# Patient Record
Sex: Female | Born: 1945
Health system: Southern US, Community
[De-identification: ages and names within clinical notes are randomized; demographics above are authoritative.]

## PROBLEM LIST (undated history)

## (undated) DIAGNOSIS — J449 Chronic obstructive pulmonary disease, unspecified: Secondary | ICD-10-CM

## (undated) DIAGNOSIS — I10 Essential (primary) hypertension: Secondary | ICD-10-CM

## (undated) DIAGNOSIS — F419 Anxiety disorder, unspecified: Secondary | ICD-10-CM

## (undated) DIAGNOSIS — M1990 Unspecified osteoarthritis, unspecified site: Secondary | ICD-10-CM

## (undated) DIAGNOSIS — E119 Type 2 diabetes mellitus without complications: Secondary | ICD-10-CM

## (undated) DIAGNOSIS — R600 Localized edema: Secondary | ICD-10-CM

## (undated) DIAGNOSIS — C44310 Basal cell carcinoma of skin of unspecified parts of face: Secondary | ICD-10-CM

## (undated) DIAGNOSIS — J40 Bronchitis, not specified as acute or chronic: Secondary | ICD-10-CM

## (undated) DIAGNOSIS — D689 Coagulation defect, unspecified: Secondary | ICD-10-CM

## (undated) DIAGNOSIS — T7840XA Allergy, unspecified, initial encounter: Secondary | ICD-10-CM

## (undated) DIAGNOSIS — D649 Anemia, unspecified: Secondary | ICD-10-CM

## (undated) DIAGNOSIS — E7800 Pure hypercholesterolemia, unspecified: Secondary | ICD-10-CM

## (undated) DIAGNOSIS — H269 Unspecified cataract: Secondary | ICD-10-CM

## (undated) HISTORY — PX: EYE SURGERY: SHX253

## (undated) HISTORY — PX: ABDOMINAL HYSTERECTOMY: SHX81

## (undated) HISTORY — DX: Coagulation defect, unspecified: D68.9

## (undated) HISTORY — DX: Basal cell carcinoma of skin of unspecified parts of face: C44.310

## (undated) HISTORY — DX: Chronic obstructive pulmonary disease, unspecified: J44.9

## (undated) HISTORY — PX: TONSILLECTOMY: SUR1361

## (undated) HISTORY — PX: TUBAL LIGATION: SHX77

## (undated) HISTORY — PX: BASAL CELL CARCINOMA EXCISION: SHX1214

## (undated) HISTORY — DX: Allergy, unspecified, initial encounter: T78.40XA

## (undated) HISTORY — DX: Anemia, unspecified: D64.9

## (undated) HISTORY — DX: Unspecified cataract: H26.9

## (undated) HISTORY — PX: CARPAL TUNNEL RELEASE: SHX101

---

## 2010-01-10 LAB — HM DEXA SCAN: HM Dexa Scan: NORMAL

## 2012-05-14 DIAGNOSIS — S0010XA Contusion of unspecified eyelid and periocular area, initial encounter: Secondary | ICD-10-CM | POA: Diagnosis not present

## 2012-05-29 DIAGNOSIS — K5730 Diverticulosis of large intestine without perforation or abscess without bleeding: Secondary | ICD-10-CM | POA: Diagnosis not present

## 2012-05-29 DIAGNOSIS — Z1211 Encounter for screening for malignant neoplasm of colon: Secondary | ICD-10-CM | POA: Diagnosis not present

## 2012-06-06 DIAGNOSIS — E1139 Type 2 diabetes mellitus with other diabetic ophthalmic complication: Secondary | ICD-10-CM | POA: Diagnosis not present

## 2012-06-06 DIAGNOSIS — E781 Pure hyperglyceridemia: Secondary | ICD-10-CM | POA: Diagnosis not present

## 2012-06-09 HISTORY — PX: COLONOSCOPY: SHX174

## 2012-06-10 DIAGNOSIS — Z01818 Encounter for other preprocedural examination: Secondary | ICD-10-CM | POA: Diagnosis not present

## 2012-06-10 DIAGNOSIS — Z1211 Encounter for screening for malignant neoplasm of colon: Secondary | ICD-10-CM | POA: Diagnosis not present

## 2012-06-10 DIAGNOSIS — K5730 Diverticulosis of large intestine without perforation or abscess without bleeding: Secondary | ICD-10-CM | POA: Diagnosis not present

## 2012-06-10 DIAGNOSIS — K648 Other hemorrhoids: Secondary | ICD-10-CM | POA: Diagnosis not present

## 2012-06-13 DIAGNOSIS — I10 Essential (primary) hypertension: Secondary | ICD-10-CM | POA: Diagnosis not present

## 2012-06-23 LAB — HM COLONOSCOPY

## 2012-06-24 DIAGNOSIS — Z1231 Encounter for screening mammogram for malignant neoplasm of breast: Secondary | ICD-10-CM | POA: Diagnosis not present

## 2012-06-24 DIAGNOSIS — E785 Hyperlipidemia, unspecified: Secondary | ICD-10-CM | POA: Diagnosis not present

## 2012-06-24 DIAGNOSIS — E781 Pure hyperglyceridemia: Secondary | ICD-10-CM | POA: Diagnosis not present

## 2012-06-24 DIAGNOSIS — I10 Essential (primary) hypertension: Secondary | ICD-10-CM | POA: Diagnosis not present

## 2012-06-24 DIAGNOSIS — E1139 Type 2 diabetes mellitus with other diabetic ophthalmic complication: Secondary | ICD-10-CM | POA: Diagnosis not present

## 2012-06-24 DIAGNOSIS — E1165 Type 2 diabetes mellitus with hyperglycemia: Secondary | ICD-10-CM | POA: Diagnosis not present

## 2012-06-24 DIAGNOSIS — M25569 Pain in unspecified knee: Secondary | ICD-10-CM | POA: Diagnosis not present

## 2012-10-16 DIAGNOSIS — Z23 Encounter for immunization: Secondary | ICD-10-CM | POA: Diagnosis not present

## 2013-01-28 DIAGNOSIS — I10 Essential (primary) hypertension: Secondary | ICD-10-CM | POA: Diagnosis not present

## 2013-01-28 DIAGNOSIS — IMO0002 Reserved for concepts with insufficient information to code with codable children: Secondary | ICD-10-CM | POA: Diagnosis not present

## 2013-01-28 DIAGNOSIS — H2510 Age-related nuclear cataract, unspecified eye: Secondary | ICD-10-CM | POA: Diagnosis not present

## 2013-01-28 DIAGNOSIS — E119 Type 2 diabetes mellitus without complications: Secondary | ICD-10-CM | POA: Diagnosis not present

## 2013-01-28 DIAGNOSIS — E785 Hyperlipidemia, unspecified: Secondary | ICD-10-CM | POA: Diagnosis not present

## 2013-01-28 DIAGNOSIS — M1710 Unilateral primary osteoarthritis, unspecified knee: Secondary | ICD-10-CM | POA: Diagnosis not present

## 2013-04-17 DIAGNOSIS — J209 Acute bronchitis, unspecified: Secondary | ICD-10-CM | POA: Diagnosis not present

## 2013-04-28 DIAGNOSIS — J40 Bronchitis, not specified as acute or chronic: Secondary | ICD-10-CM | POA: Diagnosis not present

## 2013-06-03 DIAGNOSIS — E119 Type 2 diabetes mellitus without complications: Secondary | ICD-10-CM | POA: Diagnosis not present

## 2013-06-03 DIAGNOSIS — I10 Essential (primary) hypertension: Secondary | ICD-10-CM | POA: Diagnosis not present

## 2013-06-18 ENCOUNTER — Ambulatory Visit: Payer: Self-pay | Admitting: Internal Medicine

## 2013-06-18 DIAGNOSIS — R922 Inconclusive mammogram: Secondary | ICD-10-CM | POA: Diagnosis not present

## 2013-06-18 DIAGNOSIS — Z1231 Encounter for screening mammogram for malignant neoplasm of breast: Secondary | ICD-10-CM | POA: Diagnosis not present

## 2013-06-20 DIAGNOSIS — H6120 Impacted cerumen, unspecified ear: Secondary | ICD-10-CM | POA: Diagnosis not present

## 2013-06-20 DIAGNOSIS — H93299 Other abnormal auditory perceptions, unspecified ear: Secondary | ICD-10-CM | POA: Diagnosis not present

## 2013-07-09 ENCOUNTER — Ambulatory Visit: Payer: Self-pay | Admitting: Internal Medicine

## 2013-07-09 DIAGNOSIS — R928 Other abnormal and inconclusive findings on diagnostic imaging of breast: Secondary | ICD-10-CM | POA: Diagnosis not present

## 2013-07-09 DIAGNOSIS — N6459 Other signs and symptoms in breast: Secondary | ICD-10-CM | POA: Diagnosis not present

## 2013-07-09 LAB — HM MAMMOGRAPHY: HM Mammogram: NORMAL

## 2013-08-12 DIAGNOSIS — L909 Atrophic disorder of skin, unspecified: Secondary | ICD-10-CM | POA: Diagnosis not present

## 2013-08-12 DIAGNOSIS — L821 Other seborrheic keratosis: Secondary | ICD-10-CM | POA: Diagnosis not present

## 2013-08-12 DIAGNOSIS — D485 Neoplasm of uncertain behavior of skin: Secondary | ICD-10-CM | POA: Diagnosis not present

## 2013-08-12 DIAGNOSIS — L138 Other specified bullous disorders: Secondary | ICD-10-CM | POA: Diagnosis not present

## 2013-08-12 DIAGNOSIS — D1801 Hemangioma of skin and subcutaneous tissue: Secondary | ICD-10-CM | POA: Diagnosis not present

## 2013-08-12 DIAGNOSIS — Z1283 Encounter for screening for malignant neoplasm of skin: Secondary | ICD-10-CM | POA: Diagnosis not present

## 2013-08-12 DIAGNOSIS — L919 Hypertrophic disorder of the skin, unspecified: Secondary | ICD-10-CM | POA: Diagnosis not present

## 2013-09-29 DIAGNOSIS — I10 Essential (primary) hypertension: Secondary | ICD-10-CM | POA: Diagnosis not present

## 2013-09-29 DIAGNOSIS — E119 Type 2 diabetes mellitus without complications: Secondary | ICD-10-CM | POA: Diagnosis not present

## 2013-09-29 DIAGNOSIS — Z23 Encounter for immunization: Secondary | ICD-10-CM | POA: Diagnosis not present

## 2013-09-29 DIAGNOSIS — Z0000 Encounter for general adult medical examination without abnormal findings: Secondary | ICD-10-CM | POA: Diagnosis not present

## 2013-09-29 DIAGNOSIS — E785 Hyperlipidemia, unspecified: Secondary | ICD-10-CM | POA: Diagnosis not present

## 2013-09-29 LAB — HM DIABETES FOOT EXAM: HM Diabetic Foot Exam: NORMAL

## 2013-09-29 LAB — LIPID PANEL
Cholesterol: 168 mg/dL (ref 0–200)
HDL: 53 mg/dL (ref 35–70)
LDL CALC: 80 mg/dL
Triglycerides: 174 mg/dL — AB (ref 40–160)

## 2013-09-29 LAB — TSH: TSH: 1.3 u[IU]/mL (ref <=5.90)

## 2013-10-15 DIAGNOSIS — D2271 Melanocytic nevi of right lower limb, including hip: Secondary | ICD-10-CM | POA: Diagnosis not present

## 2013-10-15 DIAGNOSIS — D2262 Melanocytic nevi of left upper limb, including shoulder: Secondary | ICD-10-CM | POA: Diagnosis not present

## 2013-10-15 DIAGNOSIS — D485 Neoplasm of uncertain behavior of skin: Secondary | ICD-10-CM | POA: Diagnosis not present

## 2014-01-15 DIAGNOSIS — E782 Mixed hyperlipidemia: Secondary | ICD-10-CM | POA: Diagnosis not present

## 2014-01-15 DIAGNOSIS — E1121 Type 2 diabetes mellitus with diabetic nephropathy: Secondary | ICD-10-CM | POA: Diagnosis not present

## 2014-01-15 DIAGNOSIS — E1165 Type 2 diabetes mellitus with hyperglycemia: Secondary | ICD-10-CM | POA: Diagnosis not present

## 2014-01-15 DIAGNOSIS — I10 Essential (primary) hypertension: Secondary | ICD-10-CM | POA: Diagnosis not present

## 2014-01-15 DIAGNOSIS — M179 Osteoarthritis of knee, unspecified: Secondary | ICD-10-CM | POA: Diagnosis not present

## 2014-02-27 DIAGNOSIS — E119 Type 2 diabetes mellitus without complications: Secondary | ICD-10-CM | POA: Diagnosis not present

## 2014-04-21 DIAGNOSIS — L570 Actinic keratosis: Secondary | ICD-10-CM | POA: Diagnosis not present

## 2014-04-21 DIAGNOSIS — Z872 Personal history of diseases of the skin and subcutaneous tissue: Secondary | ICD-10-CM | POA: Diagnosis not present

## 2014-04-21 DIAGNOSIS — Z1283 Encounter for screening for malignant neoplasm of skin: Secondary | ICD-10-CM | POA: Diagnosis not present

## 2014-04-21 DIAGNOSIS — Z808 Family history of malignant neoplasm of other organs or systems: Secondary | ICD-10-CM | POA: Diagnosis not present

## 2014-05-18 DIAGNOSIS — E1165 Type 2 diabetes mellitus with hyperglycemia: Secondary | ICD-10-CM | POA: Diagnosis not present

## 2014-05-18 DIAGNOSIS — B356 Tinea cruris: Secondary | ICD-10-CM | POA: Diagnosis not present

## 2014-05-18 DIAGNOSIS — I10 Essential (primary) hypertension: Secondary | ICD-10-CM | POA: Diagnosis not present

## 2014-05-18 DIAGNOSIS — E1121 Type 2 diabetes mellitus with diabetic nephropathy: Secondary | ICD-10-CM | POA: Diagnosis not present

## 2014-05-18 LAB — HEMOGLOBIN A1C: HEMOGLOBIN A1C: 7.7 % — AB (ref 4.0–6.0)

## 2014-07-15 ENCOUNTER — Telehealth: Payer: Self-pay

## 2014-07-15 ENCOUNTER — Other Ambulatory Visit: Payer: Self-pay | Admitting: Internal Medicine

## 2014-07-15 ENCOUNTER — Encounter: Payer: Self-pay | Admitting: Internal Medicine

## 2014-07-15 DIAGNOSIS — E1165 Type 2 diabetes mellitus with hyperglycemia: Secondary | ICD-10-CM | POA: Insufficient documentation

## 2014-07-15 DIAGNOSIS — IMO0002 Reserved for concepts with insufficient information to code with codable children: Secondary | ICD-10-CM | POA: Insufficient documentation

## 2014-07-15 DIAGNOSIS — M1710 Unilateral primary osteoarthritis, unspecified knee: Secondary | ICD-10-CM | POA: Insufficient documentation

## 2014-07-15 DIAGNOSIS — E782 Mixed hyperlipidemia: Secondary | ICD-10-CM

## 2014-07-15 DIAGNOSIS — I10 Essential (primary) hypertension: Secondary | ICD-10-CM | POA: Insufficient documentation

## 2014-07-15 DIAGNOSIS — B356 Tinea cruris: Secondary | ICD-10-CM | POA: Insufficient documentation

## 2014-07-15 DIAGNOSIS — E1169 Type 2 diabetes mellitus with other specified complication: Secondary | ICD-10-CM | POA: Insufficient documentation

## 2014-07-15 NOTE — Telephone Encounter (Signed)
Patient informed.dr 

## 2014-07-15 NOTE — Telephone Encounter (Signed)
Patient states BS is running 190-210, can't seem to get it lower. What can she do or take/dr

## 2014-07-15 NOTE — Telephone Encounter (Signed)
Patient had a fairly good A1C in May 7.7.  She declined additional medication due to cost.  I suggest that she continue her same medication, eliminate all sweets from her diet and begin walking 20 minutes per day.  If blood sugar continues to be high - have the meter checked out for accuracy.  If no improvement, then will need an OV to discuss additional medication.

## 2014-08-28 DIAGNOSIS — H35363 Drusen (degenerative) of macula, bilateral: Secondary | ICD-10-CM | POA: Diagnosis not present

## 2014-09-18 ENCOUNTER — Ambulatory Visit: Payer: Self-pay | Admitting: Internal Medicine

## 2014-09-22 ENCOUNTER — Ambulatory Visit: Payer: Self-pay | Admitting: Internal Medicine

## 2014-09-29 ENCOUNTER — Ambulatory Visit (INDEPENDENT_AMBULATORY_CARE_PROVIDER_SITE_OTHER): Payer: Medicare Other

## 2014-09-29 DIAGNOSIS — Z23 Encounter for immunization: Secondary | ICD-10-CM

## 2014-10-26 DIAGNOSIS — M25462 Effusion, left knee: Secondary | ICD-10-CM | POA: Diagnosis not present

## 2014-10-26 DIAGNOSIS — M1990 Unspecified osteoarthritis, unspecified site: Secondary | ICD-10-CM | POA: Diagnosis not present

## 2014-10-26 DIAGNOSIS — M25562 Pain in left knee: Secondary | ICD-10-CM | POA: Diagnosis not present

## 2014-10-26 DIAGNOSIS — M170 Bilateral primary osteoarthritis of knee: Secondary | ICD-10-CM | POA: Diagnosis not present

## 2014-11-10 ENCOUNTER — Encounter: Payer: Self-pay | Admitting: Internal Medicine

## 2014-11-10 ENCOUNTER — Ambulatory Visit (INDEPENDENT_AMBULATORY_CARE_PROVIDER_SITE_OTHER): Payer: Medicare Other | Admitting: Internal Medicine

## 2014-11-10 VITALS — BP 118/70 | HR 76 | Temp 98.6°F | Ht 63.0 in | Wt 237.6 lb

## 2014-11-10 DIAGNOSIS — E2839 Other primary ovarian failure: Secondary | ICD-10-CM

## 2014-11-10 DIAGNOSIS — Z1239 Encounter for other screening for malignant neoplasm of breast: Secondary | ICD-10-CM

## 2014-11-10 DIAGNOSIS — E782 Mixed hyperlipidemia: Secondary | ICD-10-CM | POA: Diagnosis not present

## 2014-11-10 DIAGNOSIS — I10 Essential (primary) hypertension: Secondary | ICD-10-CM | POA: Diagnosis not present

## 2014-11-10 DIAGNOSIS — Z1231 Encounter for screening mammogram for malignant neoplasm of breast: Secondary | ICD-10-CM | POA: Diagnosis not present

## 2014-11-10 DIAGNOSIS — J40 Bronchitis, not specified as acute or chronic: Secondary | ICD-10-CM | POA: Diagnosis not present

## 2014-11-10 DIAGNOSIS — Z79899 Other long term (current) drug therapy: Secondary | ICD-10-CM

## 2014-11-10 DIAGNOSIS — E1165 Type 2 diabetes mellitus with hyperglycemia: Secondary | ICD-10-CM | POA: Diagnosis not present

## 2014-11-10 DIAGNOSIS — IMO0001 Reserved for inherently not codable concepts without codable children: Secondary | ICD-10-CM

## 2014-11-10 DIAGNOSIS — M170 Bilateral primary osteoarthritis of knee: Secondary | ICD-10-CM | POA: Diagnosis not present

## 2014-11-10 HISTORY — DX: Other long term (current) drug therapy: Z79.899

## 2014-11-10 MED ORDER — AMOXICILLIN-POT CLAVULANATE 875-125 MG PO TABS
1.0000 | ORAL_TABLET | Freq: Two times a day (BID) | ORAL | Status: DC
Start: 2014-11-10 — End: 2015-02-11

## 2014-11-10 MED ORDER — PIOGLITAZONE HCL 15 MG PO TABS: 15.0000 mg | ORAL_TABLET | Freq: Every day | ORAL | Status: DC

## 2014-11-10 NOTE — Progress Notes (Signed)
Date:  11/10/2014   Name:  Nancy Chen   DOB:  06-26-1945   MRN:  [id number]   Chief Complaint: Diabetes; Hypertension; Hyperlipidemia; and Cough Diabetes She presents for her follow-up diabetic visit. She has type 2 diabetes mellitus. Pertinent negatives for hypoglycemia include no confusion or headaches. Pertinent negatives for diabetes include no blurred vision, no chest pain and no fatigue. Symptoms are stable. She is compliant with treatment all of the time. Her breakfast blood glucose is taken between 9-10 am. Her breakfast blood glucose range is generally 180-200 mg/dl.  Hypertension This is a chronic problem. The current episode started more than 1 year ago. The problem is unchanged. The problem is controlled. Associated symptoms include shortness of breath. Pertinent negatives include no blurred vision, chest pain or headaches. There are no known risk factors for coronary artery disease.  Hyperlipidemia This is a chronic problem. The current episode started more than 1 year ago. The problem is controlled. Recent lipid tests were reviewed and are high. Exacerbating diseases include diabetes and obesity. Associated symptoms include shortness of breath. Pertinent negatives include no chest pain. Current antihyperlipidemic treatment includes statins. There are no compliance problems.   Cough This is a new problem. The current episode started 1 to 4 weeks ago. The problem has been gradually worsening. The problem occurs hourly. The cough is productive of sputum. Associated symptoms include chills, nasal congestion, postnasal drip, a sore throat and shortness of breath. Pertinent negatives include no chest pain, ear pain, fever or headaches. She has tried nothing for the symptoms.  Knee pain - Patient has ongoing severe bilateral knee pain. She's been seen by orthopedics and needs surgery. However he won't do that until she loses 20 pounds and gets her hemoglobin A1c less than 7. She is working on  her diet and has lost a few pounds. She is having trouble walking and would like a walker if possible. Also has a bathtub at home which she is trying to convert into a shower. She questions whether she needs a bone density as it's been a number of years.    Review of Systems  Constitutional: Positive for chills. Negative for fever, diaphoresis and fatigue.  HENT: Positive for postnasal drip and sore throat. Negative for ear pain.   Eyes: Negative for blurred vision.  Respiratory: Positive for cough and shortness of breath. Negative for apnea and chest tightness.   Cardiovascular: Negative for chest pain.  Gastrointestinal: Negative for abdominal pain.  Genitourinary: Negative for dysuria and pelvic pain.  Musculoskeletal: Positive for arthralgias and gait problem.  Neurological: Negative for light-headedness, numbness and headaches.  Psychiatric/Behavioral: Negative for confusion and decreased concentration.    Patient Active Problem List   Diagnosis Date Noted  . Essential (primary) hypertension 07/15/2014  . Calcium blood increased 07/15/2014  . Mixed hyperlipidemia 07/15/2014  . Dermatophytosis of groin 07/15/2014  . Arthritis of knee, degenerative 07/15/2014  . Diabetes mellitus type 2, uncontrolled ([place]) 07/15/2014    Prior to Admission medications   Medication Sig Start Date End Date Taking? Authorizing Provider  aspirin (ECOTRIN LOW STRENGTH) 81 MG EC tablet Take 1 tablet by mouth daily.   Yes Historical Provider, MD  cyanocobalamin 1000 MCG tablet Take 1 tablet by mouth daily.   Yes Historical Provider, MD  glucose blood (ACCU-CHEK AVIVA) test strip  01/29/13  Yes Historical Provider, MD  lisinopril-hydrochlorothiazide (PRINZIDE,ZESTORETIC) 20-12.5 MG per tablet Take 1 tablet by mouth daily. 04/14/14  Yes Historical Provider, MD  metFORMIN (GLUCOPHAGE)  1000 MG tablet Take 1 tablet by mouth 2 (two) times daily. 04/14/14  Yes Historical Provider, MD  nystatin cream (MYCOSTATIN)  Apply 1 application topically 2 (two) times daily. 05/18/14  Yes Historical Provider, MD  Omega-3 Fatty Acids (FISH OIL) 1200 MG CPDR Take 1 capsule by mouth 2 (two) times daily.   Yes Historical Provider, MD  Pyridoxine HCl (VITAMIN B6) 200 MG TABS Take 1 tablet by mouth daily.   Yes Historical Provider, MD  simvastatin (ZOCOR) 20 MG tablet Take 1 tablet by mouth daily. 04/14/14  Yes Historical Provider, MD  sitaGLIPtin (JANUVIA) 100 MG tablet Take 1 tablet by mouth daily.   Yes Historical Provider, MD  Multiple Vitamins-Minerals (PRESERVISION AREDS) TABS Take 1 tablet by mouth daily.    Historical Provider, MD    Allergies  Allergen Reactions  . Codeine Hives  . Glipizide Swelling    Past Surgical History  Procedure Laterality Date  . Carpal tunnel release Bilateral   . Abdominal hysterectomy    . Colonoscopy  06/2012    normal (in [facility])    Social History  Substance Use Topics  . Smoking status: Never Smoker   . Smokeless tobacco: None  . Alcohol Use: 1.2 oz/week    2 Standard drinks or equivalent per week     Medication list has been reviewed and updated.   Physical Exam  Constitutional: She is oriented to person, place, and time. She appears well-developed and well-nourished. No distress.  HENT:  Head: Normocephalic and atraumatic.  Right Ear: Tympanic membrane and ear canal normal.  Left Ear: Tympanic membrane and ear canal normal.  Nose: Right sinus exhibits maxillary sinus tenderness. Right sinus exhibits no frontal sinus tenderness. Left sinus exhibits maxillary sinus tenderness. Left sinus exhibits no frontal sinus tenderness.  Mouth/Throat: Uvula is midline. Posterior oropharyngeal erythema present.  Eyes: Conjunctivae are normal. Right eye exhibits no discharge. Left eye exhibits no discharge. No scleral icterus.  Neck: Normal range of motion. Carotid bruit is not present.  Cardiovascular: Normal rate, regular rhythm, normal heart sounds and intact distal pulses.    Pulmonary/Chest: Effort normal. No respiratory distress. She has decreased breath sounds (bronchial breath sounds bilaterally).  Musculoskeletal:       Right knee: She exhibits decreased range of motion.       Left knee: She exhibits decreased range of motion.  Lymphadenopathy:    She has no cervical adenopathy.  Neurological: She is alert and oriented to person, place, and time.  Skin: Skin is warm and dry. No rash noted.  Psychiatric: She has a normal mood and affect. Her speech is normal and behavior is normal. Thought content normal.  Nursing note and vitals reviewed.   BP 118/70 mmHg  Pulse 76  Temp(Src) 98.6 F (37 C)  Ht 5\' 3"  (1.6 m)  Wt 237 lb 9.6 oz (107.775 kg)  BMI 42.10 kg/m2  SpO2 96%  Assessment and Plan: 1. Bronchitis - CBC with Differential/Platelet - amoxicillin-clavulanate (AUGMENTIN) 875-125 MG tablet; Take 1 tablet by mouth 2 (two) times daily.  Dispense: 20 tablet; Refill: 0  2. Essential (primary) hypertension Controlled on current regimen - Comprehensive metabolic panel - TSH  3. Uncontrolled type 2 diabetes mellitus without complication, without long-term current use of insulin ([place]) Not well-controlled; will add Actos 15 mg - Comprehensive metabolic panel - Hemoglobin A1c - Microalbumin / creatinine urine ratio - pioglitazone (ACTOS) 15 MG tablet; Take 1 tablet (15 mg total) by mouth daily.  Dispense: 30 tablet; Refill:  5  4. Mixed hyperlipidemia Continue Zocor - Lipid panel  5. Primary osteoarthritis of both knees Prescription for Rollator walker and a pivoting shower chair given  7. Encounter for screening mammogram for breast cancer Continue regular self exams - MM DIGITAL SCREENING BILATERAL; Future  8. Ovarian failure Last bone density in 2012 - DG Bone Density; Future  9. Long-term use of high-risk medication Will need to check urine for microscopic hematuria at every visit   Halina Maidens, MD [facility]  11/10/2014

## 2014-11-11 LAB — CBC WITH DIFFERENTIAL/PLATELET
BASOS ABS: 0 10*3/uL (ref 0.0–0.2)
BASOS: 1 %
EOS (ABSOLUTE): 0.2 10*3/uL (ref 0.0–0.4)
Eos: 4 %
Hematocrit: 40.9 % (ref 34.0–46.6)
Hemoglobin: 13.7 g/dL (ref 11.1–15.9)
IMMATURE GRANS (ABS): 0 10*3/uL (ref 0.0–0.1)
Immature Granulocytes: 1 %
LYMPHS ABS: 1.5 10*3/uL (ref 0.7–3.1)
LYMPHS: 35 %
MCH: 29.8 pg (ref 26.6–33.0)
MCHC: 33.5 g/dL (ref 31.5–35.7)
MCV: 89 fL (ref 79–97)
Monocytes Absolute: 0.7 10*3/uL (ref 0.1–0.9)
Monocytes: 16 %
NEUTROS ABS: 1.9 10*3/uL (ref 1.4–7.0)
Neutrophils: 43 %
Platelets: 212 10*3/uL (ref 150–379)
RBC: 4.6 x10E6/uL (ref 3.77–5.28)
RDW: 13.3 % (ref 12.3–15.4)
WBC: 4.2 10*3/uL (ref 3.4–10.8)

## 2014-11-11 LAB — COMPREHENSIVE METABOLIC PANEL
A/G RATIO: 2.1 (ref 1.1–2.5)
ALBUMIN: 4.5 g/dL (ref 3.6–4.8)
ALK PHOS: 47 IU/L (ref 39–117)
ALT: 49 IU/L — ABNORMAL HIGH (ref 0–32)
AST: 38 IU/L (ref 0–40)
BUN / CREAT RATIO: 17 (ref 11–26)
BUN: 10 mg/dL (ref 8–27)
Bilirubin Total: 0.5 mg/dL (ref 0.0–1.2)
CHLORIDE: 97 mmol/L (ref 97–106)
CO2: 25 mmol/L (ref 18–29)
Calcium: 9.8 mg/dL (ref 8.7–10.3)
Creatinine, Ser: 0.59 mg/dL (ref 0.57–1.00)
GFR calc non Af Amer: 95 mL/min/{1.73_m2} (ref >59)
GFR, EST AFRICAN AMERICAN: 110 mL/min/{1.73_m2} (ref >59)
GLUCOSE: 133 mg/dL — AB (ref 65–99)
Globulin, Total: 2.1 g/dL (ref 1.5–4.5)
POTASSIUM: 4 mmol/L (ref 3.5–5.2)
SODIUM: 142 mmol/L (ref 136–144)
TOTAL PROTEIN: 6.6 g/dL (ref 6.0–8.5)

## 2014-11-11 LAB — LIPID PANEL
CHOL/HDL RATIO: 3.1 ratio (ref 0.0–4.4)
Cholesterol, Total: 144 mg/dL (ref 100–199)
HDL: 47 mg/dL (ref >39)
LDL CALC: 69 mg/dL (ref 0–99)
Triglycerides: 142 mg/dL (ref 0–149)
VLDL CHOLESTEROL CAL: 28 mg/dL (ref 5–40)

## 2014-11-11 LAB — MICROALBUMIN / CREATININE URINE RATIO
CREATININE, UR: 200.6 mg/dL
MICROALB/CREAT RATIO: 126 mg/g creat — ABNORMAL HIGH (ref 0.0–30.0)
Microalbumin, Urine: 252.7 ug/mL

## 2014-11-11 LAB — HEMOGLOBIN A1C
Est. average glucose Bld gHb Est-mCnc: 171 mg/dL
HEMOGLOBIN A1C: 7.6 % — AB (ref 4.8–5.6)

## 2014-11-11 LAB — TSH: TSH: 1.3 u[IU]/mL (ref 0.450–4.500)

## 2014-11-12 ENCOUNTER — Ambulatory Visit
Admission: RE | Admit: 2014-11-12 | Discharge: 2014-11-12 | Disposition: A | Discharge status: Home / Self Care | Payer: Medicare Other | Source: Ambulatory Visit | Attending: Internal Medicine | Admitting: Internal Medicine

## 2014-11-12 DIAGNOSIS — E2839 Other primary ovarian failure: Secondary | ICD-10-CM | POA: Insufficient documentation

## 2014-11-12 DIAGNOSIS — M810 Age-related osteoporosis without current pathological fracture: Secondary | ICD-10-CM | POA: Diagnosis not present

## 2014-11-12 DIAGNOSIS — Z780 Asymptomatic menopausal state: Secondary | ICD-10-CM | POA: Diagnosis not present

## 2014-11-12 DIAGNOSIS — Z1231 Encounter for screening mammogram for malignant neoplasm of breast: Secondary | ICD-10-CM | POA: Diagnosis not present

## 2014-11-27 ENCOUNTER — Telehealth: Payer: Self-pay

## 2014-11-27 NOTE — Telephone Encounter (Signed)
Patient states has been off antibiotic for a week, states she is fatigued, cannot stay awake, falls asleep if she sits down for hours, every time she sits down, feels like warm spells, sweating, chills. She has to force herself to stay awake.dr

## 2014-11-27 NOTE — Telephone Encounter (Signed)
Patient needs to be seen to evaluate theses symptoms.

## 2014-11-30 NOTE — Telephone Encounter (Signed)
Patient informed and feels better, will call if change.dr

## 2015-02-11 ENCOUNTER — Encounter: Payer: Self-pay | Admitting: Internal Medicine

## 2015-02-11 ENCOUNTER — Ambulatory Visit (INDEPENDENT_AMBULATORY_CARE_PROVIDER_SITE_OTHER): Payer: Medicare Other | Admitting: Internal Medicine

## 2015-02-11 VITALS — BP 122/76 | HR 76 | Ht 63.0 in | Wt 240.6 lb

## 2015-02-11 DIAGNOSIS — M179 Osteoarthritis of knee, unspecified: Secondary | ICD-10-CM | POA: Diagnosis not present

## 2015-02-11 DIAGNOSIS — E118 Type 2 diabetes mellitus with unspecified complications: Secondary | ICD-10-CM | POA: Insufficient documentation

## 2015-02-11 DIAGNOSIS — E1100 Type 2 diabetes mellitus with hyperosmolarity without nonketotic hyperglycemic-hyperosmolar coma (NKHHC): Secondary | ICD-10-CM | POA: Diagnosis not present

## 2015-02-11 DIAGNOSIS — Z23 Encounter for immunization: Secondary | ICD-10-CM | POA: Diagnosis not present

## 2015-02-11 DIAGNOSIS — I10 Essential (primary) hypertension: Secondary | ICD-10-CM | POA: Diagnosis not present

## 2015-02-11 DIAGNOSIS — Z1159 Encounter for screening for other viral diseases: Secondary | ICD-10-CM | POA: Diagnosis not present

## 2015-02-11 DIAGNOSIS — E119 Type 2 diabetes mellitus without complications: Secondary | ICD-10-CM

## 2015-02-11 DIAGNOSIS — E782 Mixed hyperlipidemia: Secondary | ICD-10-CM

## 2015-02-11 DIAGNOSIS — M1710 Unilateral primary osteoarthritis, unspecified knee: Secondary | ICD-10-CM

## 2015-02-11 LAB — POCT URINALYSIS DIPSTICK
BILIRUBIN UA: NEGATIVE
Blood, UA: NEGATIVE
GLUCOSE UA: NEGATIVE
KETONES UA: NEGATIVE
LEUKOCYTES UA: NEGATIVE
NITRITE UA: NEGATIVE
PH UA: 6
Protein, UA: NEGATIVE
Spec Grav, UA: 1.02
Urobilinogen, UA: 0.2

## 2015-02-11 MED ORDER — PIOGLITAZONE HCL 15 MG PO TABS: 15.0000 mg | ORAL_TABLET | Freq: Every day | ORAL | Status: DC

## 2015-02-11 NOTE — Progress Notes (Signed)
Patient: Nancy Chen, Female    DOB: 23-Jun-1945, 70 y.o.   MRN: [id number] Visit Date: 02/11/2015  Today's Provider: Halina Maidens, MD   Chief Complaint  Patient presents with  . Medicare Wellness  . Diabetes  . Hypertension  . Hyperlipidemia   Subjective:    Annual wellness visit Nancy Chen is a 70 y.o. female who presents today for her Subsequent Annual Wellness Visit. She feels fairly well. She reports exercising none due to knee pain. She reports she is sleeping fairly well. She denies breast problems.  Mammogram was done in November.  ----------------------------------------------------------- Diabetes She presents for her follow-up diabetic visit. She has type 2 diabetes mellitus. Her disease course has been fluctuating. There are no hypoglycemic associated symptoms. Pertinent negatives for hypoglycemia include no confusion, dizziness, headaches or nervousness/anxiousness. Pertinent negatives for diabetes include no blurred vision, no chest pain, no fatigue, no foot paresthesias, no polydipsia, no polyuria, no weakness and no weight loss. She is following a generally healthy diet. She never participates in exercise. Her breakfast blood glucose is taken between 6-7 am. Her breakfast blood glucose range is generally 140-180 mg/dl. An ACE inhibitor/angiotensin II receptor blocker is being taken.  Hypertension This is a chronic problem. The current episode started more than 1 year ago. The problem is controlled. Pertinent negatives include no blurred vision, chest pain, headaches, neck pain, palpitations or shortness of breath.  Hyperlipidemia This is a chronic problem. The current episode started more than 1 year ago. The problem is controlled. Recent lipid tests were reviewed and are normal. Pertinent negatives include no chest pain or shortness of breath. Current antihyperlipidemic treatment includes statins. The current treatment provides significant improvement of lipids.    Lab Results  Component Value Date   HGBA1C 7.6* 11/10/2014   Lab Results  Component Value Date   CREATININE 0.59 11/10/2014   Lab Results  Component Value Date   CHOL 144 11/10/2014   HDL 47 11/10/2014   LDLCALC 69 11/10/2014   TRIG 142 11/10/2014   CHOLHDL 3.1 11/10/2014    Review of Systems  Constitutional: Negative for fever, weight loss, diaphoresis and fatigue.  HENT: Negative for hearing loss, nosebleeds, tinnitus, trouble swallowing and voice change.   Eyes: Negative for blurred vision and visual disturbance.  Respiratory: Negative for cough, shortness of breath and wheezing.   Cardiovascular: Negative for chest pain, palpitations and leg swelling.  Gastrointestinal: Negative for abdominal pain, diarrhea, constipation and blood in stool.  Endocrine: Negative for polydipsia and polyuria.  Genitourinary: Negative for dysuria, vaginal bleeding, vaginal discharge and difficulty urinating.  Musculoskeletal: Positive for joint swelling, arthralgias and gait problem. Negative for neck pain.  Neurological: Negative for dizziness, syncope, weakness, numbness and headaches.  Hematological: Negative for adenopathy.  Psychiatric/Behavioral: Negative for confusion, sleep disturbance and dysphoric mood. The patient is not nervous/anxious.     Social History   Social History  . Marital Status: Widowed    Spouse Name: N/A  . Number of Children: N/A  . Years of Education: N/A   Occupational History  . Not on file.   Social History Main Topics  . Smoking status: Never Smoker   . Smokeless tobacco: Not on file  . Alcohol Use: 1.2 oz/week    2 Standard drinks or equivalent per week  . Drug Use: No  . Sexual Activity: Not on file   Other Topics Concern  . Not on file   Social History Narrative    Patient Active Problem List  Diagnosis Date Noted  . Ovarian failure 11/10/2014  . Long-term use of high-risk medication 11/10/2014  . Essential (primary) hypertension  07/15/2014  . Calcium blood increased 07/15/2014  . Mixed hyperlipidemia 07/15/2014  . Dermatophytosis of groin 07/15/2014  . Tricompartment osteoarthritis of knee 07/15/2014  . Diabetes mellitus type 2, uncontrolled ([place]) 07/15/2014    Past Surgical History  Procedure Laterality Date  . Carpal tunnel release Bilateral   . Abdominal hysterectomy    . Colonoscopy  06/2012    normal (in [facility])    Her family history includes Breast cancer in her maternal grandmother; CAD in her father.    Previous Medications   ASPIRIN (ECOTRIN LOW STRENGTH) 81 MG EC TABLET    Take 1 tablet by mouth daily.   CYANOCOBALAMIN 1000 MCG TABLET    Take 1 tablet by mouth daily.   GLUCOSE BLOOD (ACCU-CHEK AVIVA) TEST STRIP       LISINOPRIL-HYDROCHLOROTHIAZIDE (PRINZIDE,ZESTORETIC) 20-12.5 MG PER TABLET    Take 1 tablet by mouth daily.   METFORMIN (GLUCOPHAGE) 1000 MG TABLET    Take 1 tablet by mouth 2 (two) times daily.   MULTIPLE VITAMINS-MINERALS (PRESERVISION AREDS) TABS    Take 1 tablet by mouth daily.   OMEGA-3 FATTY ACIDS (FISH OIL) 1200 MG CPDR    Take 1 capsule by mouth 2 (two) times daily.   PIOGLITAZONE (ACTOS) 15 MG TABLET    Take 1 tablet (15 mg total) by mouth daily.   PYRIDOXINE HCL (VITAMIN B6) 200 MG TABS    Take 1 tablet by mouth daily.   SIMVASTATIN (ZOCOR) 20 MG TABLET    Take 1 tablet by mouth daily.   SITAGLIPTIN (JANUVIA) 100 MG TABLET    Take 1 tablet by mouth daily.    Patient Care Team: Glean Hess, MD as PCP - General (Family Medicine) Domingo Pulse, MD as Referring Physician (Orthopedic Surgery)     Objective:   Vitals: BP 122/76 mmHg  Pulse 76  Ht 5\' 3"  (1.6 m)  Wt 240 lb 9.6 oz (109.135 kg)  BMI 42.63 kg/m2  Physical Exam  Constitutional: She is oriented to person, place, and time. She appears well-developed and well-nourished. No distress.  HENT:  Head: Normocephalic and atraumatic.  Right Ear: Tympanic membrane and ear canal normal.  Left Ear: Tympanic  membrane and ear canal normal.  Nose: Right sinus exhibits no maxillary sinus tenderness. Left sinus exhibits no maxillary sinus tenderness.  Mouth/Throat: Uvula is midline and oropharynx is clear and moist.  Eyes: Conjunctivae and EOM are normal. Right eye exhibits no discharge. Left eye exhibits no discharge. No scleral icterus.  Neck: Normal range of motion. Neck supple. Carotid bruit is not present. No erythema present. No thyromegaly present.  Cardiovascular: Normal rate, regular rhythm, normal heart sounds and normal pulses.   Pulmonary/Chest: Effort normal and breath sounds normal. No respiratory distress. She has no wheezes. Right breast exhibits no mass, no nipple discharge, no skin change and no tenderness. Left breast exhibits no mass, no nipple discharge, no skin change and no tenderness.  Abdominal: Soft. Bowel sounds are normal. There is no hepatosplenomegaly. There is no tenderness. There is no CVA tenderness.  Musculoskeletal: Normal range of motion. She exhibits no edema or tenderness.  Lymphadenopathy:    She has no cervical adenopathy.    She has no axillary adenopathy.  Neurological: She is alert and oriented to person, place, and time. She has normal reflexes. No cranial nerve deficit or sensory deficit.  Skin: Skin is warm, dry and intact. No rash noted.  Psychiatric: She has a normal mood and affect. Her speech is normal and behavior is normal. Thought content normal.  Nursing note and vitals reviewed.   Activities of Daily Living In your present state of health, do you have any difficulty performing the following activities: 02/11/2015  Hearing? N  Vision? N  Difficulty concentrating or making decisions? Y  Walking or climbing stairs? Y  Dressing or bathing? N  Doing errands, shopping? N    Fall Risk Assessment Fall Risk  02/11/2015 11/10/2014  Falls in the past year? No No      Depression Screen PHQ 2/9 Scores 02/11/2015 11/10/2014  PHQ - 2 Score 0 0     Cognitive Testing - 6-CIT   Correct? Score   What year is it? yes 0 Yes = 0    No = 4  What month is it? yes 0 Yes = 0    No = 3  Remember:     [address]ia [address], [place], [place]     What time is it? yes 0 Yes = 0    No = 3  Count backwards from 20 to 1 yes 0 Correct = 0    1 error = 2   More than 1 error = 4  Say the months of the year in reverse. yes 0 Correct = 0    1 error = 2   More than 1 error = 4  What address did I ask you to remember? yes 0 Correct = 0  1 error = 2    2 error = 4    3 error = 6    4 error = 8    All wrong = 10       TOTAL SCORE  0/28   Interpretation:  Normal  Normal (0-7) Abnormal (8-28)   Diabetic Foot Exam - Simple   Simple Foot Form  Diabetic Foot exam was performed with the following findings:  Yes 02/11/2015 11:00 AM  Visual Inspection  No deformities, no ulcerations, no other skin breakdown bilaterally:  Yes  Sensation Testing  Intact to touch and monofilament testing bilaterally:  Yes  Pulse Check  Posterior Tibialis and Dorsalis pulse intact bilaterally:  Yes  Comments       Medicare Annual Wellness Visit Summary:  Reviewed patient's Family Medical History Reviewed and updated list of patient's medical providers Assessment of cognitive impairment was done Assessed patient's functional ability Established a written schedule for health screening [place] Completed and Reviewed  Exercise Activities and Dietary recommendations Goals    None      Immunization History  Administered Date(s) Administered  . Hepatitis B 07/04/2011  . Influenza,inj,Quad PF,36+ Mos 09/29/2014  . Pneumococcal Polysaccharide-23 01/10/2005  . Tdap 03/09/2012    Health Maintenance  Topic Date Due  . Hepatitis C Screening  September 11, 1945  . ZOSTAVAX  05/16/2007  . PNA vac Low Risk Adult (1 of 2 - PCV13) 05/15/2012  . FOOT EXAM  09/30/2014  . HEMOGLOBIN A1C  05/10/2015  . INFLUENZA VACCINE  08/10/2015  . OPHTHALMOLOGY EXAM   08/28/2015  . MAMMOGRAM  11/11/2016  . TETANUS/TDAP  03/10/2022  . COLONOSCOPY  06/24/2022  . DEXA SCAN  Completed     Discussed health benefits of physical activity, and encouraged her to engage in regular exercise appropriate for her age and condition.    ------------------------------------------------------------------------------------------------------------   Assessment & Plan:  1. Essential (primary) hypertension Controlled on current medications - POCT urinalysis dipstick  2. Uncontrolled type 2 diabetes mellitus with hyperosmolarity without coma, without long-term current use of insulin (HCC) Continue metformin, actos, and will do patient assistance for januva Samples of [place] 25 mg given to take temporarily in place of januvia - Microalbumin / creatinine urine ratio - Comprehensive metabolic panel - Hemoglobin A1c - pioglitazone (ACTOS) 15 MG tablet; Take 1 tablet (15 mg total) by mouth daily.  Dispense: 90 tablet; Refill: 3  3. Tricompartment osteoarthritis of knee Unable to lose weight - discussed a more structured program such as weight watchers  4. Mixed hyperlipidemia On statin therapy - recent lipids controlled  5. Need for hepatitis C screening test - Hepatitis C antibody  7. Need for pneumococcal vaccination - Pneumococcal conjugate vaccine 13-valent IM   Halina Maidens, MD [facility]  02/11/2015

## 2015-02-11 NOTE — Patient Instructions (Addendum)
Take Nesina 25 mg once per day in place of Januvia until prescription arrives  Pneumococcal Conjugate Vaccine (PCV13)  1. Why get vaccinated? Vaccination can protect both children and adults from pneumococcal disease. Pneumococcal disease is caused by bacteria that can spread from person to person through close contact. It can cause ear infections, and it can also lead to more serious infections of the:  Lungs (pneumonia),  Blood (bacteremia), and  Covering of the brain and spinal cord (meningitis). Pneumococcal pneumonia is most common among adults. Pneumococcal meningitis can cause deafness and brain damage, and it kills about 1 child in 10 who get it. Anyone can get pneumococcal disease, but children under 31 years of age and adults 30 years and older, people with certain medical conditions, and cigarette smokers are at the highest risk. Before there was a vaccine, the [place] saw:  more than 700 cases of meningitis,  about 13,000 blood infections,  about 5 million ear infections, and  about 200 deaths in children under 5 each year from pneumococcal disease. Since vaccine became available, severe pneumococcal disease in these children has fallen by 88%. About 18,000 older adults die of pneumococcal disease each year in the [place]. Treatment of pneumococcal infections with penicillin and other drugs is not as effective as it used to be, because some strains of the disease have become resistant to these drugs. This makes prevention of the disease, through vaccination, even more important. 2. PCV13 vaccine Pneumococcal conjugate vaccine (called PCV13) protects against 13 types of pneumococcal bacteria. PCV13 is routinely given to children at 2, 4, 6, and 73-81 months of age. It is also recommended for children and adults 9 to 22 years of age with certain health conditions, and for all adults 40 years of age and older. Your doctor can give you details. 3. Some people should  not get this vaccine Anyone who has ever had a life-threatening allergic reaction to a dose of this vaccine, to an earlier pneumococcal vaccine called PCV7, or to any vaccine containing diphtheria toxoid (for example, DTaP), should not get PCV13. Anyone with a severe allergy to any component of PCV13 should not get the vaccine. Tell your doctor if the person being vaccinated has any severe allergies. If the person scheduled for vaccination is not feeling well, your healthcare provider might decide to reschedule the shot on another day. 4. Risks of a vaccine reaction With any medicine, including vaccines, there is a chance of reactions. These are usually mild and go away on their own, but serious reactions are also possible. Problems reported following PCV13 varied by age and dose in the series. The most common problems reported among children were:  About half became drowsy after the shot, had a temporary loss of appetite, or had redness or tenderness where the shot was given.  About 1 out of 3 had swelling where the shot was given.  About 1 out of 3 had a mild fever, and about 1 in 20 had a fever over 102.53F.  Up to about 8 out of 10 became fussy or irritable. Adults have reported pain, redness, and swelling where the shot was given; also mild fever, fatigue, headache, chills, or muscle pain. Young children who get PCV13 along with inactivated flu vaccine at the same time may be at increased risk for seizures caused by fever. Ask your doctor for more information. Problems that could happen after any vaccine:  People sometimes faint after a medical procedure, including vaccination. Sitting or lying  down for about 15 minutes can help prevent fainting, and injuries caused by a fall. Tell your doctor if you feel dizzy, or have vision changes or ringing in the ears.  Some older children and adults get severe pain in the shoulder and have difficulty moving the arm where a shot was given. This  happens very rarely.  Any medication can cause a severe allergic reaction. Such reactions from a vaccine are very rare, estimated at about 1 in a million doses, and would happen within a few minutes to a few hours after the vaccination. As with any medicine, there is a very small chance of a vaccine causing a serious injury or death. The safety of vaccines is always being monitored. For more information, visit: http://www.aguilar.org/ 5. What if there is a serious reaction? What should I look for?  Look for anything that concerns you, such as signs of a severe allergic reaction, very high fever, or unusual behavior. Signs of a severe allergic reaction can include hives, swelling of the face and throat, difficulty breathing, a fast heartbeat, dizziness, and weakness-usually within a few minutes to a few hours after the vaccination. What should I do?  If you think it is a severe allergic reaction or other emergency that can't wait, call 9-1-1 or get the person to the nearest hospital. Otherwise, call your doctor. Reactions should be reported to the Vaccine Adverse Event Reporting System (VAERS). Your doctor should file this report, or you can do it yourself through the VAERS web site at www.vaers.SamedayNews.es, or by calling 515-522-5976. VAERS does not give medical advice. 6. The National Vaccine Injury Compensation Program The [employer] Vaccine Injury Compensation Program (VICP) is a federal program that was created to compensate people who may have been injured by certain vaccines. Persons who believe they may have been injured by a vaccine can learn about the program and about filing a claim by calling 403-336-3086 or visiting the [employer] website at GoldCloset.com.ee. There is a time limit to file a claim for compensation. 7. How can I learn more?  Ask your healthcare provider. He or she can give you the vaccine package insert or suggest other sources of information.  Call your  local or state health department.  Contact the Centers for Disease Control and Prevention (CDC):  Call 305-880-6406 (1-800-CDC-INFO) or  Visit CDC's website at http://hunter.com/ Vaccine Information Statement PCV13 Vaccine (11/13/2013)   This information is not intended to replace advice given to you by your health care provider. Make sure you discuss any questions you have with your health care provider.   Document Released: 10/23/2005 Document Revised: 01/16/2014 Document Reviewed: 11/20/2013 Elsevier Interactive Patient Education 2016 [employer]. Pneumococcal Conjugate Vaccine (PCV13)  1. Why get vaccinated? Vaccination can protect both children and adults from pneumococcal disease. Pneumococcal disease is caused by bacteria that can spread from person to person through close contact. It can cause ear infections, and it can also lead to more serious infections of the:  Lungs (pneumonia),  Blood (bacteremia), and  Covering of the brain and spinal cord (meningitis). Pneumococcal pneumonia is most common among adults. Pneumococcal meningitis can cause deafness and brain damage, and it kills about 1 child in 10 who get it. Anyone can get pneumococcal disease, but children under 36 years of age and adults 29 years and older, people with certain medical conditions, and cigarette smokers are at the highest risk. Before there was a vaccine, the [place] saw:  more than 700 cases of meningitis,  about 13,000 blood infections,  about 5 million ear infections, and  about 200 deaths in children under 5 each year from pneumococcal disease. Since vaccine became available, severe pneumococcal disease in these children has fallen by 88%. About 18,000 older adults die of pneumococcal disease each year in the [place]. Treatment of pneumococcal infections with penicillin and other drugs is not as effective as it used to be, because some strains of the disease have become  resistant to these drugs. This makes prevention of the disease, through vaccination, even more important. 2. PCV13 vaccine Pneumococcal conjugate vaccine (called PCV13) protects against 13 types of pneumococcal bacteria. PCV13 is routinely given to children at 2, 4, 6, and 87-13 months of age. It is also recommended for children and adults 10 to 14 years of age with certain health conditions, and for all adults 46 years of age and older. Your doctor can give you details. 3. Some people should not get this vaccine Anyone who has ever had a life-threatening allergic reaction to a dose of this vaccine, to an earlier pneumococcal vaccine called PCV7, or to any vaccine containing diphtheria toxoid (for example, DTaP), should not get PCV13. Anyone with a severe allergy to any component of PCV13 should not get the vaccine. Tell your doctor if the person being vaccinated has any severe allergies. If the person scheduled for vaccination is not feeling well, your healthcare provider might decide to reschedule the shot on another day. 4. Risks of a vaccine reaction With any medicine, including vaccines, there is a chance of reactions. These are usually mild and go away on their own, but serious reactions are also possible. Problems reported following PCV13 varied by age and dose in the series. The most common problems reported among children were:  About half became drowsy after the shot, had a temporary loss of appetite, or had redness or tenderness where the shot was given.  About 1 out of 3 had swelling where the shot was given.  About 1 out of 3 had a mild fever, and about 1 in 20 had a fever over 102.28F.  Up to about 8 out of 10 became fussy or irritable. Adults have reported pain, redness, and swelling where the shot was given; also mild fever, fatigue, headache, chills, or muscle pain. Young children who get PCV13 along with inactivated flu vaccine at the same time may be at increased risk for  seizures caused by fever. Ask your doctor for more information. Problems that could happen after any vaccine:  People sometimes faint after a medical procedure, including vaccination. Sitting or lying down for about 15 minutes can help prevent fainting, and injuries caused by a fall. Tell your doctor if you feel dizzy, or have vision changes or ringing in the ears.  Some older children and adults get severe pain in the shoulder and have difficulty moving the arm where a shot was given. This happens very rarely.  Any medication can cause a severe allergic reaction. Such reactions from a vaccine are very rare, estimated at about 1 in a million doses, and would happen within a few minutes to a few hours after the vaccination. As with any medicine, there is a very small chance of a vaccine causing a serious injury or death. The safety of vaccines is always being monitored. For more information, visit: http://www.aguilar.org/ 5. What if there is a serious reaction? What should I look for?  Look for anything that concerns you, such as signs of a severe  allergic reaction, very high fever, or unusual behavior. Signs of a severe allergic reaction can include hives, swelling of the face and throat, difficulty breathing, a fast heartbeat, dizziness, and weakness-usually within a few minutes to a few hours after the vaccination. What should I do?  If you think it is a severe allergic reaction or other emergency that can't wait, call 9-1-1 or get the person to the nearest hospital. Otherwise, call your doctor. Reactions should be reported to the Vaccine Adverse Event Reporting System (VAERS). Your doctor should file this report, or you can do it yourself through the VAERS web site at www.vaers.SamedayNews.es, or by calling 415-058-1688. VAERS does not give medical advice. 6. The National Vaccine Injury Compensation Program The [employer] Vaccine Injury Compensation Program (VICP) is a federal program that was  created to compensate people who may have been injured by certain vaccines. Persons who believe they may have been injured by a vaccine can learn about the program and about filing a claim by calling (575)348-3488 or visiting the [place] website at GoldCloset.com.ee. There is a time limit to file a claim for compensation. 7. How can I learn more?  Ask your healthcare provider. He or she can give you the vaccine package insert or suggest other sources of information.  Call your local or state health department.  Contact the Centers for Disease Control and Prevention (CDC):  Call 612 585 0111 (1-800-CDC-INFO) or  Visit CDC's website at http://hunter.com/ Vaccine Information Statement PCV13 Vaccine (11/13/2013)   This information is not intended to replace advice given to you by your health care provider. Make sure you discuss any questions you have with your health care provider.   Document Released: 10/23/2005 Document Revised: 01/16/2014 Document Reviewed: 11/20/2013 Elsevier Interactive Patient Education 2016 [employer]  Topic Date Due  . PNA vac Low Risk Adult (1 of 2 - PCV13) Prevnar-13 given today  . HEMOGLOBIN A1C  05/10/2015  . INFLUENZA VACCINE  08/10/2015  . OPHTHALMOLOGY EXAM  08/28/2015  . FOOT EXAM  02/11/2016  . MAMMOGRAM  11/11/2016  . TETANUS/TDAP  03/10/2022  . COLONOSCOPY  06/24/2022  . DEXA SCAN  Completed  . ZOSTAVAX  Addressed  . Hepatitis C Screening  Addressed

## 2015-02-12 ENCOUNTER — Telehealth: Payer: Self-pay

## 2015-02-12 LAB — COMPREHENSIVE METABOLIC PANEL
ALBUMIN: 4.8 g/dL (ref 3.6–4.8)
ALK PHOS: 48 IU/L (ref 39–117)
ALT: 32 IU/L (ref 0–32)
AST: 31 IU/L (ref 0–40)
Albumin/Globulin Ratio: 2.1 (ref 1.1–2.5)
BUN/Creatinine Ratio: 23 (ref 11–26)
BUN: 14 mg/dL (ref 8–27)
Bilirubin Total: 0.5 mg/dL (ref 0.0–1.2)
CALCIUM: 10.1 mg/dL (ref 8.7–10.3)
CHLORIDE: 97 mmol/L (ref 96–106)
CO2: 28 mmol/L (ref 18–29)
CREATININE: 0.6 mg/dL (ref 0.57–1.00)
GFR calc Af Amer: 109 mL/min/{1.73_m2} (ref >59)
GFR calc non Af Amer: 95 mL/min/{1.73_m2} (ref >59)
Globulin, Total: 2.3 g/dL (ref 1.5–4.5)
Glucose: 136 mg/dL — ABNORMAL HIGH (ref 65–99)
Potassium: 4.7 mmol/L (ref 3.5–5.2)
Sodium: 142 mmol/L (ref 134–144)
Total Protein: 7.1 g/dL (ref 6.0–8.5)

## 2015-02-12 LAB — MICROALBUMIN / CREATININE URINE RATIO
Creatinine, Urine: 97.6 mg/dL
MICROALB/CREAT RATIO: 25.2 mg/g creat (ref 0.0–30.0)
MICROALBUM., U, RANDOM: 24.6 ug/mL

## 2015-02-12 LAB — HEMOGLOBIN A1C
ESTIMATED AVERAGE GLUCOSE: 163 mg/dL
HEMOGLOBIN A1C: 7.3 % — AB (ref 4.8–5.6)

## 2015-02-12 LAB — HEPATITIS C ANTIBODY

## 2015-02-12 NOTE — Telephone Encounter (Signed)
-----   Message from Glean Hess, MD sent at 02/12/2015  8:18 AM EST ----- Labs are normal.  DM has improved!  Continue same medications.  Hep C is negative.

## 2015-02-12 NOTE — Telephone Encounter (Signed)
Left message for patient to call back  

## 2015-02-12 NOTE — Telephone Encounter (Signed)
Spoke with patient. Patient advised of all results and verbalized understanding. Will call back with any future questions or concerns. MA[facility]  

## 2015-03-02 DIAGNOSIS — H35363 Drusen (degenerative) of macula, bilateral: Secondary | ICD-10-CM | POA: Diagnosis not present

## 2015-03-02 DIAGNOSIS — E119 Type 2 diabetes mellitus without complications: Secondary | ICD-10-CM | POA: Diagnosis not present

## 2015-03-03 LAB — HM DIABETES EYE EXAM

## 2015-03-19 ENCOUNTER — Encounter: Payer: Self-pay | Admitting: *Deleted

## 2015-03-26 ENCOUNTER — Ambulatory Visit (INDEPENDENT_AMBULATORY_CARE_PROVIDER_SITE_OTHER): Payer: Medicare Other | Admitting: Family Medicine

## 2015-03-26 ENCOUNTER — Encounter: Payer: Self-pay | Admitting: Family Medicine

## 2015-03-26 VITALS — BP 122/78 | HR 93 | Temp 98.0°F | Ht 63.0 in | Wt 248.0 lb

## 2015-03-26 DIAGNOSIS — J40 Bronchitis, not specified as acute or chronic: Secondary | ICD-10-CM

## 2015-03-26 MED ORDER — DOXYCYCLINE MONOHYDRATE 100 MG PO CAPS: 100.0000 mg | ORAL_CAPSULE | Freq: Two times a day (BID) | ORAL | Status: DC

## 2015-03-26 MED ORDER — BENZONATATE 100 MG PO CAPS: 100.0000 mg | ORAL_CAPSULE | Freq: Two times a day (BID) | ORAL | Status: DC | PRN

## 2015-03-26 NOTE — Progress Notes (Signed)
Name: Nancy Chen   MRN: [id number]    DOB: [birth year]   Date:03/26/2015       Progress Note  Subjective  Chief Complaint  Chief Complaint  Patient presents with  . Cough    Fever, chills, chest congestion, body aches. Patient granddaughter was seen yesterday and is Flu B positive.     Cough This is a new problem. The current episode started yesterday. The problem has been gradually worsening. The cough is non-productive. Associated symptoms include chest pain, chills, heartburn, myalgias, nasal congestion, postnasal drip, rhinorrhea, a sore throat, sweats and wheezing. Pertinent negatives include no ear congestion, ear pain, fever, headaches, hemoptysis, rash, shortness of breath or weight loss. The symptoms are aggravated by cold air. There is no history of environmental allergies.    No problem-specific assessment & plan notes found for this encounter.   History reviewed. No pertinent past medical history.  Past Surgical History  Procedure Laterality Date  . Carpal tunnel release Bilateral   . Abdominal hysterectomy    . Colonoscopy  06/2012    normal (in [facility])    Family History  Problem Relation Age of Onset  . CAD Father   . Breast cancer Maternal Grandmother     great gm    Social History   Social History  . Marital Status: Widowed    Spouse Name: N/A  . Number of Children: N/A  . Years of Education: N/A   Occupational History  . Not on file.   Social History Main Topics  . Smoking status: Never Smoker   . Smokeless tobacco: Not on file  . Alcohol Use: 1.2 oz/week    2 Standard drinks or equivalent per week  . Drug Use: No  . Sexual Activity: Not on file   Other Topics Concern  . Not on file   Social History Narrative    Allergies  Allergen Reactions  . Codeine Hives  . Glipizide Swelling     Review of Systems  Constitutional: Positive for chills. Negative for fever, weight loss and malaise/fatigue.  HENT: Positive for postnasal drip,  rhinorrhea and sore throat. Negative for ear discharge and ear pain.   Eyes: Negative for blurred vision.  Respiratory: Positive for cough and wheezing. Negative for hemoptysis, sputum production and shortness of breath.   Cardiovascular: Positive for chest pain. Negative for palpitations and leg swelling.  Gastrointestinal: Positive for heartburn. Negative for nausea, abdominal pain, diarrhea, constipation, blood in stool and melena.  Genitourinary: Negative for dysuria, urgency, frequency and hematuria.  Musculoskeletal: Positive for myalgias. Negative for back pain, joint pain and neck pain.  Skin: Negative for rash.  Neurological: Negative for dizziness, tingling, sensory change, focal weakness and headaches.  Endo/Heme/Allergies: Negative for environmental allergies and polydipsia. Does not bruise/bleed easily.  Psychiatric/Behavioral: Negative for depression and suicidal ideas. The patient is not nervous/anxious and does not have insomnia.      Objective  Filed Vitals:   03/26/15 1031  BP: 122/78  Pulse: 93  Temp: 98 F (36.7 C)  TempSrc: Oral  Height: 5\' 3"  (1.6 m)  Weight: 248 lb (112.492 kg)  SpO2: 98%    Physical Exam  Constitutional: She is well-developed, well-nourished, and in no distress. No distress.  HENT:  Head: Normocephalic and atraumatic.  Right Ear: External ear normal.  Left Ear: External ear normal.  Nose: Nose normal.  Mouth/Throat: Oropharynx is clear and moist.  Eyes: Conjunctivae and EOM are normal. Pupils are equal, round, and reactive to light.  Right eye exhibits no discharge. Left eye exhibits no discharge.  Neck: Normal range of motion. Neck supple. No JVD present. No thyromegaly present.  Cardiovascular: Normal rate, regular rhythm, normal heart sounds and intact distal pulses.  Exam reveals no gallop and no friction rub.   No murmur heard. Pulmonary/Chest: Effort normal and breath sounds normal.  Abdominal: Soft. Bowel sounds are normal. She  exhibits no mass. There is no tenderness. There is no guarding.  Musculoskeletal: Normal range of motion. She exhibits no edema.  Lymphadenopathy:    She has no cervical adenopathy.  Neurological: She is alert. She has normal reflexes.  Skin: Skin is warm and dry. She is not diaphoretic.  Psychiatric: Mood and affect normal.  Nursing note and vitals reviewed.     Assessment & Plan  Problem List Items Addressed This Visit    None    Visit Diagnoses    Bronchitis    -  Primary    Relevant Medications    doxycycline (MONODOX) 100 MG capsule    benzonatate (TESSALON) 100 MG capsule         Dr. Deanna Jones [facility]  03/26/2015

## 2015-04-01 ENCOUNTER — Encounter: Payer: Self-pay | Admitting: Family Medicine

## 2015-04-01 ENCOUNTER — Ambulatory Visit (INDEPENDENT_AMBULATORY_CARE_PROVIDER_SITE_OTHER): Payer: Medicare Other | Admitting: Family Medicine

## 2015-04-01 ENCOUNTER — Ambulatory Visit
Admission: RE | Admit: 2015-04-01 | Discharge: 2015-04-01 | Disposition: A | Discharge status: Home / Self Care | Payer: Medicare Other | Source: Ambulatory Visit | Attending: Family Medicine | Admitting: Family Medicine

## 2015-04-01 VITALS — BP 100/70 | HR 90 | Temp 97.9°F | Ht 63.0 in | Wt 243.0 lb

## 2015-04-01 DIAGNOSIS — J219 Acute bronchiolitis, unspecified: Secondary | ICD-10-CM

## 2015-04-01 DIAGNOSIS — R05 Cough: Secondary | ICD-10-CM | POA: Diagnosis not present

## 2015-04-01 DIAGNOSIS — I700 Atherosclerosis of aorta: Secondary | ICD-10-CM | POA: Insufficient documentation

## 2015-04-01 MED ORDER — LEVOFLOXACIN 500 MG PO TABS: 500.0000 mg | ORAL_TABLET | Freq: Every day | ORAL | Status: DC

## 2015-04-01 MED ORDER — MONTELUKAST SODIUM 10 MG PO TABS: 10.0000 mg | ORAL_TABLET | Freq: Every day | ORAL | Status: DC

## 2015-04-01 MED ORDER — ALBUTEROL SULFATE (2.5 MG/3ML) 0.083% IN NEBU: 2.5000 mg | INHALATION_SOLUTION | Freq: Four times a day (QID) | RESPIRATORY_TRACT | Status: DC | PRN

## 2015-04-01 MED ORDER — ALBUTEROL SULFATE (2.5 MG/3ML) 0.083% IN NEBU: 2.5000 mg | INHALATION_SOLUTION | Freq: Once | RESPIRATORY_TRACT | Status: DC

## 2015-04-01 NOTE — Patient Instructions (Signed)
How to Use an Inhaler Proper inhaler technique is very important. Good technique ensures that the medicine reaches the lungs. Poor technique results in depositing the medicine on the tongue and back of the throat rather than in the airways. If you do not use the inhaler with good technique, the medicine will not help you. STEPS TO FOLLOW IF USING AN INHALER WITHOUT AN EXTENSION TUBE 1. Remove the cap from the inhaler. 2. If you are using the inhaler for the first time, you will need to prime it. Shake the inhaler for 5 seconds and release four puffs into the air, away from your face. Ask your health care provider or pharmacist if you have questions about priming your inhaler. 3. Shake the inhaler for 5 seconds before each breath in (inhalation). 4. Position the inhaler so that the top of the canister faces up. 5. Put your index finger on the top of the medicine canister. Your thumb supports the bottom of the inhaler. 6. Open your mouth. 7. Either place the inhaler between your teeth and place your lips tightly around the mouthpiece, or hold the inhaler 1-2 inches away from your open mouth. If you are unsure of which technique to use, ask your health care provider. 8. Breathe out (exhale) normally and as completely as possible. 9. Press the canister down with your index finger to release the medicine. 10. At the same time as the canister is pressed, inhale deeply and slowly until your lungs are completely filled. This should take 4-6 seconds. Keep your tongue down. 11. Hold the medicine in your lungs for 5-10 seconds (10 seconds is best). This helps the medicine get into the small airways of your lungs. 12. Breathe out slowly, through pursed lips. Whistling is an example of pursed lips. 13. Wait at least 15-30 seconds between puffs. Continue with the above steps until you have taken the number of puffs your health care provider has ordered. Do not use the inhaler more than your health care provider  tells you. 14. Replace the cap on the inhaler. 15. Follow the directions from your health care provider or the inhaler insert for cleaning the inhaler. STEPS TO FOLLOW IF USING AN INHALER WITH AN EXTENSION (SPACER) 1. Remove the cap from the inhaler. 2. If you are using the inhaler for the first time, you will need to prime it. Shake the inhaler for 5 seconds and release four puffs into the air, away from your face. Ask your health care provider or pharmacist if you have questions about priming your inhaler. 3. Shake the inhaler for 5 seconds before each breath in (inhalation). 4. Place the open end of the spacer onto the mouthpiece of the inhaler. 5. Position the inhaler so that the top of the canister faces up and the spacer mouthpiece faces you. 6. Put your index finger on the top of the medicine canister. Your thumb supports the bottom of the inhaler and the spacer. 7. Breathe out (exhale) normally and as completely as possible. 8. Immediately after exhaling, place the spacer between your teeth and into your mouth. Close your lips tightly around the spacer. 9. Press the canister down with your index finger to release the medicine. 10. At the same time as the canister is pressed, inhale deeply and slowly until your lungs are completely filled. This should take 4-6 seconds. Keep your tongue down and out of the way. 11. Hold the medicine in your lungs for 5-10 seconds (10 seconds is best). This helps the   medicine get into the small airways of your lungs. Exhale. °12. Repeat inhaling deeply through the spacer mouthpiece. Again hold that breath for up to 10 seconds (10 seconds is best). Exhale slowly. If it is difficult to take this second deep breath through the spacer, breathe normally several times through the spacer. Remove the spacer from your mouth. °13. Wait at least 15-30 seconds between puffs. Continue with the above steps until you have taken the number of puffs your health care provider has  ordered. Do not use the inhaler more than your health care provider tells you. °14. Remove the spacer from the inhaler, and place the cap on the inhaler. °15. Follow the directions from your health care provider or the inhaler insert for cleaning the inhaler and spacer. °If you are using different kinds of inhalers, use your quick relief medicine to open the airways 10-15 minutes before using a steroid if instructed to do so by your health care provider. If you are unsure which inhalers to use and the order of using them, ask your health care provider, nurse, or respiratory therapist. °If you are using a steroid inhaler, always rinse your mouth with water after your last puff, then gargle and spit out the water. Do not swallow the water. °AVOID: °· Inhaling before or after starting the spray of medicine. It takes practice to coordinate your breathing with triggering the spray. °· Inhaling through the nose (rather than the mouth) when triggering the spray. °HOW TO DETERMINE IF YOUR INHALER IS FULL OR NEARLY EMPTY °You cannot know when an inhaler is empty by shaking it. A few inhalers are now being made with dose counters. Ask your health care provider for a prescription that has a dose counter if you feel you need that extra help. If your inhaler does not have a counter, ask your health care provider to help you determine the date you need to refill your inhaler. Write the refill date on a calendar or your inhaler canister. Refill your inhaler 7-10 days before it runs out. Be sure to keep an adequate supply of medicine. This includes making sure it is not expired, and that you have a spare inhaler.  °SEEK MEDICAL CARE IF:  °· Your symptoms are only partially relieved with your inhaler. °· You are having trouble using your inhaler. °· You have some increase in phlegm. °SEEK IMMEDIATE MEDICAL CARE IF:  °· You feel little or no relief with your inhalers. You are still wheezing and are feeling shortness of breath or  tightness in your chest or both. °· You have dizziness, headaches, or a fast heart rate. °· You have chills, fever, or night sweats. °· You have a noticeable increase in phlegm production, or there is blood in the phlegm. °MAKE SURE YOU:  °· Understand these instructions. °· Will watch your condition. °· Will get help right away if you are not doing well or get worse. °  °This information is not intended to replace advice given to you by your health care provider. Make sure you discuss any questions you have with your health care provider. °  °Document Released: 12/24/1999 Document Revised: 10/16/2012 Document Reviewed: 07/25/2012 °Elsevier Interactive Patient Education ©2016 [employer] Inc[employer] ° °

## 2015-04-01 NOTE — Progress Notes (Signed)
Name: Nancy Chen   MRN: [id number]    DOB: September 25, 1945   Date:04/01/2015       Progress Note  Subjective  Chief Complaint  Chief Complaint  Patient presents with  . Bronchitis    was seen on 03/26/15 for cough and cong- put on Doxy and tessalon perles- has 4 days left on Doxy and a couple of Tessalon perles left- not feeling better/ now has wheezing    Cough This is a new ("cough that wants to happen") problem. The current episode started in the past 7 days. The problem has been gradually worsening. The problem occurs every few minutes. The cough is non-productive. Associated symptoms include chills, shortness of breath and wheezing. Pertinent negatives include no chest pain, ear congestion, ear pain, fever, headaches, heartburn, hemoptysis, myalgias, nasal congestion, postnasal drip, rash, rhinorrhea, sore throat or weight loss. Nothing aggravates the symptoms. She has tried a beta-agonist inhaler (nebulization some improvement) for the symptoms. There is no history of environmental allergies.    No problem-specific assessment & plan notes found for this encounter.   History reviewed. No pertinent past medical history.  Past Surgical History  Procedure Laterality Date  . Carpal tunnel release Bilateral   . Abdominal hysterectomy    . Colonoscopy  06/2012    normal (in [facility])    Family History  Problem Relation Age of Onset  . CAD Father   . Breast cancer Maternal Grandmother     great gm    Social History   Social History  . Marital Status: Widowed    Spouse Name: N/A  . Number of Children: N/A  . Years of Education: N/A   Occupational History  . Not on file.   Social History Main Topics  . Smoking status: Never Smoker   . Smokeless tobacco: Not on file  . Alcohol Use: 1.2 oz/week    2 Standard drinks or equivalent per week  . Drug Use: No  . Sexual Activity: Not on file   Other Topics Concern  . Not on file   Social History Narrative    Allergies   Allergen Reactions  . Codeine Hives  . Glipizide Swelling     Review of Systems  Constitutional: Positive for chills. Negative for fever, weight loss and malaise/fatigue.  HENT: Negative for ear discharge, ear pain, postnasal drip, rhinorrhea and sore throat.   Eyes: Negative for blurred vision.  Respiratory: Positive for cough, shortness of breath and wheezing. Negative for hemoptysis and sputum production.   Cardiovascular: Negative for chest pain, palpitations and leg swelling.  Gastrointestinal: Negative for heartburn, nausea, abdominal pain, diarrhea, constipation, blood in stool and melena.  Genitourinary: Negative for dysuria, urgency, frequency and hematuria.  Musculoskeletal: Negative for myalgias, back pain, joint pain and neck pain.  Skin: Negative for rash.  Neurological: Negative for dizziness, tingling, sensory change, focal weakness and headaches.  Endo/Heme/Allergies: Negative for environmental allergies and polydipsia. Does not bruise/bleed easily.  Psychiatric/Behavioral: Negative for depression and suicidal ideas. The patient is not nervous/anxious and does not have insomnia.      Objective  Filed Vitals:   04/01/15 1511  BP: 100/70  Pulse: 90  Temp: 97.9 F (36.6 C)  TempSrc: Oral  Height: 5\' 3"  (1.6 m)  Weight: 243 lb (110.224 kg)  SpO2: 99%    Physical Exam  Constitutional: She is well-developed, well-nourished, and in no distress. No distress.  HENT:  Head: Normocephalic and atraumatic.  Right Ear: External ear normal.  Left Ear:  External ear normal.  Nose: Nose normal.  Mouth/Throat: Oropharynx is clear and moist.  Eyes: Conjunctivae and EOM are normal. Pupils are equal, round, and reactive to light. Right eye exhibits no discharge. Left eye exhibits no discharge.  Neck: Normal range of motion. Neck supple. No JVD present. No thyromegaly present.  Cardiovascular: Normal rate, regular rhythm, normal heart sounds and intact distal pulses.  Exam  reveals no gallop and no friction rub.   No murmur heard. Pulmonary/Chest: Effort normal and breath sounds normal.  Abdominal: Soft. Bowel sounds are normal. She exhibits no mass. There is no tenderness. There is no guarding.  Musculoskeletal: Normal range of motion. She exhibits no edema.  Lymphadenopathy:    She has no cervical adenopathy.  Neurological: She is alert. She has normal reflexes.  Skin: Skin is warm and dry. She is not diaphoretic.  Psychiatric: Mood and affect normal.  Nursing note and vitals reviewed.     Assessment & Plan  Problem List Items Addressed This Visit    None    Visit Diagnoses    Bronchiolitis    -  Primary    symbicort sample    Relevant Medications    levofloxacin (LEVAQUIN) 500 MG tablet    albuterol (PROVENTIL) (2.5 MG/3ML) 0.083% nebulizer solution    montelukast (SINGULAIR) 10 MG tablet    albuterol (PROVENTIL) (2.5 MG/3ML) 0.083% nebulizer solution 2.5 mg (Start on 04/01/2015  4:00 PM)    Other Relevant Orders    DG Chest 2 View    CBC w/Diff/Platelet         Dr. Mac[facility]  04/01/2015

## 2015-04-02 ENCOUNTER — Other Ambulatory Visit: Payer: Self-pay | Admitting: Family Medicine

## 2015-04-02 LAB — CBC WITH DIFFERENTIAL/PLATELET
BASOS: 0 %
Basophils Absolute: 0 10*3/uL (ref 0.0–0.2)
EOS (ABSOLUTE): 0.1 10*3/uL (ref 0.0–0.4)
EOS: 1 %
HEMATOCRIT: 40.8 % (ref 34.0–46.6)
HEMOGLOBIN: 13.7 g/dL (ref 11.1–15.9)
IMMATURE GRANULOCYTES: 0 %
Immature Grans (Abs): 0 10*3/uL (ref 0.0–0.1)
LYMPHS ABS: 3.2 10*3/uL — AB (ref 0.7–3.1)
Lymphs: 54 %
MCH: 29.2 pg (ref 26.6–33.0)
MCHC: 33.6 g/dL (ref 31.5–35.7)
MCV: 87 fL (ref 79–97)
MONOCYTES: 12 %
Monocytes Absolute: 0.7 10*3/uL (ref 0.1–0.9)
NEUTROS PCT: 33 %
Neutrophils Absolute: 1.9 10*3/uL (ref 1.4–7.0)
Platelets: 253 10*3/uL (ref 150–379)
RBC: 4.69 x10E6/uL (ref 3.77–5.28)
RDW: 13.8 % (ref 12.3–15.4)
WBC: 5.9 10*3/uL (ref 3.4–10.8)

## 2015-04-02 MED ORDER — ALBUTEROL SULFATE HFA 108 (90 BASE) MCG/ACT IN AERS: 2.0000 | INHALATION_SPRAY | Freq: Four times a day (QID) | RESPIRATORY_TRACT | Status: DC | PRN

## 2015-06-11 ENCOUNTER — Encounter: Payer: Self-pay | Admitting: Internal Medicine

## 2015-06-11 ENCOUNTER — Ambulatory Visit (INDEPENDENT_AMBULATORY_CARE_PROVIDER_SITE_OTHER): Payer: Medicare Other | Admitting: Internal Medicine

## 2015-06-11 VITALS — BP 110/80 | HR 70 | Resp 16 | Ht 63.0 in | Wt 236.0 lb

## 2015-06-11 DIAGNOSIS — E1100 Type 2 diabetes mellitus with hyperosmolarity without nonketotic hyperglycemic-hyperosmolar coma (NKHHC): Secondary | ICD-10-CM | POA: Diagnosis not present

## 2015-06-11 DIAGNOSIS — I10 Essential (primary) hypertension: Secondary | ICD-10-CM

## 2015-06-11 NOTE — Progress Notes (Signed)
Date:  06/11/2015   Name:  Nancy Chen   DOB:  1945-02-14   MRN:  [id number]   Chief Complaint: Diabetes Diabetes She presents for her follow-up diabetic visit. She has type 2 diabetes mellitus. Her disease course has been stable. There are no hypoglycemic associated symptoms. Associated symptoms include fatigue. Pertinent negatives for diabetes include no chest pain. Symptoms are stable. Current diabetic treatment includes oral agent (triple therapy). She is compliant with treatment all of the time.  Hypertension This is a chronic problem. The problem is unchanged. The problem is controlled. Pertinent negatives include no chest pain or shortness of breath. Past treatments include ACE inhibitors and diuretics. The current treatment provides significant improvement.  Insomnia Primary symptoms: sleep disturbance, difficulty falling asleep.  The current episode started 1 to 4 weeks ago. The problem occurs nightly.    Lab Results  Component Value Date   HGBA1C 7.3* 02/11/2015    Wt Readings from Last 3 Encounters:  06/11/15 236 lb (107.049 kg)  04/01/15 243 lb (110.224 kg)  03/26/15 248 lb (112.492 kg)     Review of Systems  Constitutional: Positive for fatigue. Negative for fever, chills and unexpected weight change.  HENT: Negative for hearing loss.   Eyes: Negative for visual disturbance.  Respiratory: Negative for cough, chest tightness and shortness of breath.   Cardiovascular: Negative for chest pain.  Gastrointestinal: Negative for abdominal pain.  Musculoskeletal: Positive for arthralgias and gait problem.  Psychiatric/Behavioral: Positive for sleep disturbance. Negative for dysphoric mood. The patient has insomnia.     Patient Active Problem List   Diagnosis Date Noted  . Uncontrolled type 2 diabetes mellitus with hyperosmolarity without coma, without long-term current use of insulin ([place]) 02/11/2015  . Ovarian failure 11/10/2014  . Long-term use of high-risk  medication 11/10/2014  . Essential (primary) hypertension 07/15/2014  . Calcium blood increased 07/15/2014  . Mixed hyperlipidemia 07/15/2014  . Dermatophytosis of groin 07/15/2014  . Tricompartment osteoarthritis of knee 07/15/2014    Prior to Admission medications   Medication Sig Start Date End Date Taking? Authorizing Provider  albuterol (PROVENTIL HFA;VENTOLIN HFA) 108 (90 Base) MCG/ACT inhaler Inhale 2 puffs into the lungs every 6 (six) hours as needed for wheezing or shortness of breath. 04/02/15   Juline Patch, MD  aspirin (ECOTRIN LOW STRENGTH) 81 MG EC tablet Take 1 tablet by mouth daily.    Historical Provider, MD  cyanocobalamin 1000 MCG tablet Take 1 tablet by mouth daily.    Historical Provider, MD  glucose blood (ACCU-CHEK AVIVA) test strip  01/29/13   Historical Provider, MD  lisinopril-hydrochlorothiazide (PRINZIDE,ZESTORETIC) 20-12.5 MG per tablet Take 1 tablet by mouth daily. 04/14/14   Historical Provider, MD  metFORMIN (GLUCOPHAGE) 1000 MG tablet Take 1 tablet by mouth 2 (two) times daily. 04/14/14   Historical Provider, MD  montelukast (SINGULAIR) 10 MG tablet Take 1 tablet (10 mg total) by mouth at bedtime. 04/01/15   Juline Patch, MD  Multiple Vitamins-Minerals (PRESERVISION AREDS) TABS Take 1 tablet by mouth daily.    Historical Provider, MD  Omega-3 Fatty Acids (FISH OIL) 1200 MG CPDR Take 1 capsule by mouth 2 (two) times daily.    Historical Provider, MD  pioglitazone (ACTOS) 15 MG tablet Take 1 tablet (15 mg total) by mouth daily. 02/11/15   Glean Hess, MD  Pyridoxine HCl (VITAMIN B6) 200 MG TABS Take 1 tablet by mouth daily.    Historical Provider, MD  simvastatin (ZOCOR) 20 MG tablet  Take 1 tablet by mouth daily. 04/14/14   Historical Provider, MD  sitaGLIPtin (JANUVIA) 100 MG tablet Take 1 tablet by mouth daily.    Historical Provider, MD    Allergies  Allergen Reactions  . Codeine Hives  . Glipizide Swelling    Past Surgical History  Procedure Laterality  Date  . Carpal tunnel release Bilateral   . Abdominal hysterectomy    . Colonoscopy  06/2012    normal (in [facility])    Social History  Substance Use Topics  . Smoking status: Never Smoker   . Smokeless tobacco: None  . Alcohol Use: 1.2 oz/week    2 Standard drinks or equivalent per week     Medication list has been reviewed and updated.   Physical Exam  Constitutional: She is oriented to person, place, and time. She appears well-developed. No distress.  HENT:  Head: Normocephalic and atraumatic.  Neck: Normal range of motion. Carotid bruit is not present.  Cardiovascular: Normal rate, regular rhythm and normal heart sounds.   Pulmonary/Chest: Effort normal and breath sounds normal. No respiratory distress.  Musculoskeletal: Normal range of motion.  Neurological: She is alert and oriented to person, place, and time.  Skin: Skin is warm and dry. No rash noted.  Psychiatric: She has a normal mood and affect. Her behavior is normal. Thought content normal.  Nursing note and vitals reviewed.   BP 110/80 mmHg  Pulse 70  Resp 16  Ht 5\' 3"  (1.6 m)  Wt 236 lb (107.049 kg)  BMI 41.82 kg/m2  SpO2 99%  Assessment and Plan: 1. Uncontrolled type 2 diabetes mellitus with hyperosmolarity without coma, without long-term current use of insulin ([place]) Continue weight loss efforts Continue same medication for now - consider stopping Actos after next visit if A1C is below 7 - Hemoglobin A1c  2. Essential (primary) hypertension controlled   Halina Maidens, MD [facility]  06/11/2015

## 2015-06-12 LAB — HEMOGLOBIN A1C
ESTIMATED AVERAGE GLUCOSE: 137 mg/dL
HEMOGLOBIN A1C: 6.4 % — AB (ref 4.8–5.6)

## 2015-06-14 DIAGNOSIS — Z1283 Encounter for screening for malignant neoplasm of skin: Secondary | ICD-10-CM | POA: Diagnosis not present

## 2015-06-14 DIAGNOSIS — D485 Neoplasm of uncertain behavior of skin: Secondary | ICD-10-CM | POA: Diagnosis not present

## 2015-06-14 DIAGNOSIS — Z872 Personal history of diseases of the skin and subcutaneous tissue: Secondary | ICD-10-CM | POA: Diagnosis not present

## 2015-06-27 ENCOUNTER — Other Ambulatory Visit: Payer: Self-pay | Admitting: Internal Medicine

## 2015-10-11 ENCOUNTER — Ambulatory Visit (INDEPENDENT_AMBULATORY_CARE_PROVIDER_SITE_OTHER): Payer: Medicare Other | Admitting: Internal Medicine

## 2015-10-11 ENCOUNTER — Encounter: Payer: Self-pay | Admitting: Internal Medicine

## 2015-10-11 VITALS — BP 128/86 | HR 84 | Resp 16 | Ht 63.0 in | Wt 238.0 lb

## 2015-10-11 DIAGNOSIS — G4730 Sleep apnea, unspecified: Secondary | ICD-10-CM | POA: Diagnosis not present

## 2015-10-11 DIAGNOSIS — Z23 Encounter for immunization: Secondary | ICD-10-CM

## 2015-10-11 DIAGNOSIS — D1722 Benign lipomatous neoplasm of skin and subcutaneous tissue of left arm: Secondary | ICD-10-CM | POA: Diagnosis not present

## 2015-10-11 DIAGNOSIS — E1100 Type 2 diabetes mellitus with hyperosmolarity without nonketotic hyperglycemic-hyperosmolar coma (NKHHC): Secondary | ICD-10-CM | POA: Diagnosis not present

## 2015-10-11 DIAGNOSIS — I10 Essential (primary) hypertension: Secondary | ICD-10-CM | POA: Diagnosis not present

## 2015-10-11 LAB — POCT URINALYSIS DIPSTICK
Bilirubin, UA: NEGATIVE
GLUCOSE UA: NEGATIVE
Ketones, UA: NEGATIVE
Leukocytes, UA: NEGATIVE
Nitrite, UA: NEGATIVE
PH UA: 5
PROTEIN UA: NEGATIVE
RBC UA: NEGATIVE
SPEC GRAV UA: 1.02
UROBILINOGEN UA: 0.2

## 2015-10-11 NOTE — Progress Notes (Signed)
Date:  10/11/2015   Name:  Nancy Chen   DOB:  November 30, 1945   MRN:  [id number]   Chief Complaint: Hypertension; Diabetes; and Hyperlipidemia Hypertension  This is a chronic problem. The current episode started more than 1 year ago. Pertinent negatives include no chest pain, headaches or shortness of breath.  Diabetes  She presents for her follow-up diabetic visit. She has type 2 diabetes mellitus. Her disease course has been stable. Pertinent negatives for hypoglycemia include no dizziness or headaches. Pertinent negatives for diabetes include no chest pain. Current diabetic treatment includes oral agent (triple therapy) (metformin, actos, [place]). She is compliant with treatment most of the time. An ACE inhibitor/angiotensin II receptor blocker is being taken.  Hyperlipidemia  This is a chronic problem. The problem is controlled. Recent lipid tests were reviewed and are normal. Pertinent negatives include no chest pain or shortness of breath. Current antihyperlipidemic treatment includes fibric acid derivatives and statins. The current treatment provides significant improvement of lipids.  Sleep disorder - pt had a home sleep study ordered by her Dentist.   It was positive with 5-15 resp events per hour and hypopneas.  Minimum O2 sat 83% with mean of 91%. She reports trying to complete a sleep study in [place] but did not log enough hours of sleep.  Lab Results  Component Value Date   HGBA1C 6.4 (H) 06/11/2015   Lab Results  Component Value Date   CHOL 144 11/10/2014   HDL 47 11/10/2014   LDLCALC 69 11/10/2014   TRIG 142 11/10/2014   CHOLHDL 3.1 11/10/2014     Review of Systems  Constitutional: Negative for chills, fever and unexpected weight change.  HENT: Positive for postnasal drip and sneezing. Negative for congestion, hearing loss and sinus pressure.   Eyes: Negative for visual disturbance.  Respiratory: Negative for cough, chest tightness and shortness of breath.     Cardiovascular: Negative for chest pain.  Gastrointestinal: Negative for abdominal pain and blood in stool.  Musculoskeletal: Positive for arthralgias and gait problem.  Skin: Negative for rash and wound.  Neurological: Negative for dizziness and headaches.  Psychiatric/Behavioral: Positive for sleep disturbance. Negative for dysphoric mood.    Patient Active Problem List   Diagnosis Date Noted  . Sleep disorder breathing 10/11/2015  . Uncontrolled type 2 diabetes mellitus with hyperosmolarity without coma, without long-term current use of insulin ([place]) 02/11/2015  . Ovarian failure 11/10/2014  . Long-term use of high-risk medication 11/10/2014  . Essential (primary) hypertension 07/15/2014  . Calcium blood increased 07/15/2014  . Mixed hyperlipidemia 07/15/2014  . Dermatophytosis of groin 07/15/2014  . Tricompartment osteoarthritis of knee 07/15/2014    Prior to Admission medications   Medication Sig Start Date End Date Taking? Authorizing Provider  albuterol (PROVENTIL HFA;VENTOLIN HFA) 108 (90 Base) MCG/ACT inhaler Inhale 2 puffs into the lungs every 6 (six) hours as needed for wheezing or shortness of breath. 04/02/15  Yes Juline Patch, MD  aspirin (ECOTRIN LOW STRENGTH) 81 MG EC tablet Take 1 tablet by mouth daily.   Yes Historical Provider, MD  cetirizine (ZYRTEC) 10 MG tablet Take 10 mg by mouth daily.   Yes Historical Provider, MD  cyanocobalamin 1000 MCG tablet Take 1 tablet by mouth daily.   Yes Historical Provider, MD  glucose blood (ACCU-CHEK AVIVA) test strip  01/29/13  Yes Historical Provider, MD  lisinopril-hydrochlorothiazide (PRINZIDE,ZESTORETIC) 20-12.5 MG tablet TAKE 1 TABLET EVERY DAY 06/27/15  Yes Glean Hess, MD  metFORMIN (GLUCOPHAGE) 1000 MG  tablet TAKE 1 TABLET TWICE DAILY 06/27/15  Yes Glean Hess, MD  Multiple Vitamins-Minerals (PRESERVISION AREDS) TABS Take 1 tablet by mouth daily.   Yes Historical Provider, MD  Omega-3 Fatty Acids (FISH OIL) 1200  MG CPDR Take 1 capsule by mouth 2 (two) times daily.   Yes Historical Provider, MD  pioglitazone (ACTOS) 15 MG tablet Take 1 tablet (15 mg total) by mouth daily. 02/11/15  Yes Glean Hess, MD  Pyridoxine HCl (VITAMIN B6) 200 MG TABS Take 1 tablet by mouth daily.   Yes Historical Provider, MD  simvastatin (ZOCOR) 20 MG tablet TAKE 1 TABLET EVERY DAY 06/27/15  Yes Glean Hess, MD  sitaGLIPtin (JANUVIA) 100 MG tablet Take 1 tablet by mouth daily.   Yes Historical Provider, MD    Allergies  Allergen Reactions  . Codeine Hives  . Glipizide Swelling    Past Surgical History:  Procedure Laterality Date  . ABDOMINAL HYSTERECTOMY    . CARPAL TUNNEL RELEASE Bilateral   . COLONOSCOPY  06/2012   normal (in [facility])    Social History  Substance Use Topics  . Smoking status: Never Smoker  . Smokeless tobacco: Never Used  . Alcohol use 1.2 oz/week    2 Standard drinks or equivalent per week     Medication list has been reviewed and updated.   Physical Exam  Constitutional: She is oriented to person, place, and time. She appears well-developed. No distress.  HENT:  Head: Normocephalic and atraumatic.  Cardiovascular: Normal rate, regular rhythm and normal heart sounds.   Pulmonary/Chest: Effort normal and breath sounds normal. No respiratory distress.  Musculoskeletal: Normal range of motion.  Neurological: She is alert and oriented to person, place, and time.  Skin: Skin is warm and dry. No rash noted.  On left forearm just below elbow on inner aspect - soft tissue swelling most consistent with a lipoma (pt noticed it as she was getting blood drawn today)  Psychiatric: She has a normal mood and affect. Her behavior is normal. Thought content normal.  Nursing note and vitals reviewed.   BP 128/86   Pulse 84   Resp 16   Ht 5\' 3"  (1.6 m)   Wt 238 lb (108 kg)   SpO2 98%   BMI 42.16 kg/m   Assessment and Plan: 1. Essential (primary) hypertension controlled  2. Uncontrolled  type 2 diabetes mellitus with hyperosmolarity without coma, without long-term current use of insulin ([facility]) Continue current medication Check urine for hgb - Hemoglobin A1c - POCT urinalysis dipstick  3. Sleep disorder breathing Need CPap titration - Cpap titration; Future  4. Need for influenza vaccination - Flu Vaccine QUAD 36+ mos IM  5. Lipoma of left upper extremity Patient reassured - follow up if worsening   Halina Maidens, MD [facility]  10/11/2015

## 2015-10-12 LAB — HEMOGLOBIN A1C
ESTIMATED AVERAGE GLUCOSE: 134 mg/dL
HEMOGLOBIN A1C: 6.3 % — AB (ref 4.8–5.6)

## 2015-10-29 ENCOUNTER — Telehealth: Payer: Self-pay | Admitting: Internal Medicine

## 2015-10-29 NOTE — Telephone Encounter (Signed)
Yes patient will drop off forms but wanted to make sure it was okay with you prior to dropping them off.

## 2015-10-29 NOTE — Telephone Encounter (Signed)
Patient has a family emergency in [place] and will be leaving on Sunday to take care of her Step Father. Patient maybe gone a couple of months and was needing her [employer] forms filled out prior to have it sent out. If okay please call patient back if there are any questions.

## 2015-10-29 NOTE — Telephone Encounter (Signed)
So is she going to drop off the forms?  I don't think we keep them here.

## 2016-01-20 ENCOUNTER — Ambulatory Visit: Payer: Medicare Other | Attending: Otolaryngology

## 2016-01-20 DIAGNOSIS — G4730 Sleep apnea, unspecified: Secondary | ICD-10-CM | POA: Diagnosis not present

## 2016-01-20 DIAGNOSIS — F5101 Primary insomnia: Secondary | ICD-10-CM | POA: Insufficient documentation

## 2016-01-20 DIAGNOSIS — G4733 Obstructive sleep apnea (adult) (pediatric): Secondary | ICD-10-CM | POA: Insufficient documentation

## 2016-02-03 ENCOUNTER — Telehealth: Payer: Self-pay

## 2016-02-03 NOTE — Telephone Encounter (Signed)
error 

## 2016-02-04 ENCOUNTER — Other Ambulatory Visit: Payer: Self-pay | Admitting: Internal Medicine

## 2016-02-04 ENCOUNTER — Encounter: Payer: Self-pay | Admitting: Internal Medicine

## 2016-02-04 ENCOUNTER — Ambulatory Visit (INDEPENDENT_AMBULATORY_CARE_PROVIDER_SITE_OTHER): Payer: Medicare Other | Admitting: Internal Medicine

## 2016-02-04 VITALS — BP 128/82 | HR 74 | Temp 97.6°F | Ht 63.0 in | Wt 246.0 lb

## 2016-02-04 DIAGNOSIS — M170 Bilateral primary osteoarthritis of knee: Secondary | ICD-10-CM

## 2016-02-04 DIAGNOSIS — Z79899 Other long term (current) drug therapy: Secondary | ICD-10-CM

## 2016-02-04 DIAGNOSIS — G479 Sleep disorder, unspecified: Secondary | ICD-10-CM

## 2016-02-04 DIAGNOSIS — I10 Essential (primary) hypertension: Secondary | ICD-10-CM

## 2016-02-04 DIAGNOSIS — E1100 Type 2 diabetes mellitus with hyperosmolarity without nonketotic hyperglycemic-hyperosmolar coma (NKHHC): Secondary | ICD-10-CM | POA: Diagnosis not present

## 2016-02-04 LAB — POCT URINALYSIS DIPSTICK
BILIRUBIN UA: NEGATIVE
KETONES UA: NEGATIVE
LEUKOCYTES UA: NEGATIVE
Nitrite, UA: NEGATIVE
PH UA: 5
Protein, UA: NEGATIVE
Spec Grav, UA: 1.015
Urobilinogen, UA: 0.2

## 2016-02-04 MED ORDER — PIOGLITAZONE HCL 15 MG PO TABS: 15.0000 mg | ORAL_TABLET | Freq: Every day | ORAL | 3 refills | Status: DC

## 2016-02-04 NOTE — Progress Notes (Signed)
Date:  02/04/2016   Name:  Nancy Chen   DOB:  Nov 01, 1945   MRN:  [id number]   Chief Complaint: Diabetes Diabetes  She presents for her follow-up diabetic visit. She has type 2 diabetes mellitus. Her disease course has been stable. Pertinent negatives for hypoglycemia include no nervousness/anxiousness. Pertinent negatives for diabetes include no chest pain and no fatigue. Symptoms are stable.  Hypertension  This is a chronic problem. The problem is controlled. Pertinent negatives include no chest pain, palpitations or shortness of breath. Past treatments include ACE inhibitors and diuretics. The current treatment provides significant improvement.  Sleep Apnea - recently tested and CPAP titration done.  Recommended 9 cm H2O.  She has not gotten the equipment and does not want it because she sleeps on her stomach.  However, review of study shows no respiratory events and no PLMs.  It is unclear why the CPAP titration was done but snoring did resolve with the appliance.  Lab Results  Component Value Date   HGBA1C 6.3 (H) 10/11/2015    Review of Systems  Constitutional: Negative for chills, fatigue and fever.  HENT: Negative for congestion.   Eyes: Negative for visual disturbance.  Respiratory: Negative for cough, chest tightness, shortness of breath and wheezing.   Cardiovascular: Negative for chest pain and palpitations.  Gastrointestinal: Negative for abdominal pain, diarrhea and vomiting.  Genitourinary: Negative for difficulty urinating and hematuria.  Musculoskeletal: Positive for arthralgias.  Skin: Negative for color change and rash.  Psychiatric/Behavioral: Positive for sleep disturbance. Negative for dysphoric mood. The patient is not nervous/anxious.     Patient Active Problem List   Diagnosis Date Noted  . Sleep apnea in adult 02/04/2016  . Sleep disorder breathing 10/11/2015  . Lipoma of left upper extremity 10/11/2015  . Uncontrolled type 2 diabetes mellitus with  hyperosmolarity without coma, without long-term current use of insulin ([place]) 02/11/2015  . Ovarian failure 11/10/2014  . Long-term use of high-risk medication 11/10/2014  . Essential (primary) hypertension 07/15/2014  . Calcium blood increased 07/15/2014  . Mixed hyperlipidemia 07/15/2014  . Dermatophytosis of groin 07/15/2014  . Tricompartment osteoarthritis of knee 07/15/2014    Prior to Admission medications   Medication Sig Start Date End Date Taking? Authorizing Provider  albuterol (PROVENTIL HFA;VENTOLIN HFA) 108 (90 Base) MCG/ACT inhaler Inhale 2 puffs into the lungs every 6 (six) hours as needed for wheezing or shortness of breath. 04/02/15  Yes Juline Patch, MD  aspirin (ECOTRIN LOW STRENGTH) 81 MG EC tablet Take 1 tablet by mouth daily.   Yes Historical Provider, MD  cetirizine (ZYRTEC) 10 MG tablet Take 10 mg by mouth daily.   Yes Historical Provider, MD  cyanocobalamin 1000 MCG tablet Take 1 tablet by mouth daily.   Yes Historical Provider, MD  glucose blood (ACCU-CHEK AVIVA) test strip  01/29/13  Yes Historical Provider, MD  lisinopril-hydrochlorothiazide (PRINZIDE,ZESTORETIC) 20-12.5 MG tablet TAKE 1 TABLET EVERY DAY 06/27/15  Yes Glean Hess, MD  metFORMIN (GLUCOPHAGE) 1000 MG tablet TAKE 1 TABLET TWICE DAILY 06/27/15  Yes Glean Hess, MD  Omega-3 Fatty Acids (FISH OIL) 1200 MG CPDR Take 1 capsule by mouth 2 (two) times daily.   Yes Historical Provider, MD  pioglitazone (ACTOS) 15 MG tablet Take 1 tablet (15 mg total) by mouth daily. 02/11/15  Yes Glean Hess, MD  Pyridoxine HCl (VITAMIN B6) 200 MG TABS Take 1 tablet by mouth daily.   Yes Historical Provider, MD  simvastatin (ZOCOR) 20 MG tablet  TAKE 1 TABLET EVERY DAY 06/27/15  Yes Glean Hess, MD  sitaGLIPtin (JANUVIA) 100 MG tablet Take 1 tablet by mouth daily.   Yes Historical Provider, MD    Allergies  Allergen Reactions  . Codeine Hives  . Glipizide Swelling    Past Surgical History:  Procedure  Laterality Date  . ABDOMINAL HYSTERECTOMY    . CARPAL TUNNEL RELEASE Bilateral   . COLONOSCOPY  06/2012   normal (in [facility])    Social History  Substance Use Topics  . Smoking status: Never Smoker  . Smokeless tobacco: Never Used  . Alcohol use 1.2 oz/week    2 Standard drinks or equivalent per week     Medication list has been reviewed and updated.   Physical Exam  Constitutional: She is oriented to person, place, and time. She appears well-developed. No distress.  HENT:  Head: Normocephalic and atraumatic.  Neck: Normal range of motion. Neck supple. No thyromegaly present.  Cardiovascular: Normal rate, regular rhythm and normal heart sounds.   Pulmonary/Chest: Effort normal and breath sounds normal. No respiratory distress. She has no wheezes.  Neurological: She is alert and oriented to person, place, and time.  Skin: Skin is warm and dry. No rash noted.  Psychiatric: She has a normal mood and affect. Her behavior is normal. Thought content normal.  Nursing note and vitals reviewed.   BP 128/82   Pulse 74   Temp 97.6 F (36.4 C)   Ht 5\' 3"  (1.6 m)   Wt 246 lb (111.6 kg)   SpO2 97%   BMI 43.58 kg/m   Assessment and Plan: 1. Essential (primary) hypertension controlled - CBC with Differential/Platelet - TSH  2. Uncontrolled type 2 diabetes mellitus with hyperosmolarity without coma, without long-term current use of insulin (HCC) Continue medication UA negative for blood - Comprehensive metabolic panel - Hemoglobin A1c - Microalbumin / creatinine urine ratio - POCT urinalysis dipstick - pioglitazone (ACTOS) 15 MG tablet; Take 1 tablet (15 mg total) by mouth daily.  Dispense: 90 tablet; Refill: 3  3. Sleep disorder Insomnia - pt declines any medication No indication for CPAP and pt does not want it anyway  4. Long-term use of high-risk medication  5. Tricompartment osteoarthritis of both knees Stable   Halina Maidens, MD [facility]  02/04/2016

## 2016-02-05 LAB — COMPREHENSIVE METABOLIC PANEL
A/G RATIO: 2.2 (ref 1.2–2.2)
ALK PHOS: 51 IU/L (ref 39–117)
ALT: 21 IU/L (ref 0–32)
AST: 24 IU/L (ref 0–40)
Albumin: 4.7 g/dL (ref 3.6–4.8)
BUN/Creatinine Ratio: 25 (ref 12–28)
BUN: 17 mg/dL (ref 8–27)
Bilirubin Total: 0.4 mg/dL (ref 0.0–1.2)
CHLORIDE: 97 mmol/L (ref 96–106)
CO2: 25 mmol/L (ref 18–29)
Calcium: 9.9 mg/dL (ref 8.7–10.3)
Creatinine, Ser: 0.67 mg/dL (ref 0.57–1.00)
GFR calc Af Amer: 104 mL/min/{1.73_m2} (ref >59)
GFR calc non Af Amer: 91 mL/min/{1.73_m2} (ref >59)
GLOBULIN, TOTAL: 2.1 g/dL (ref 1.5–4.5)
Glucose: 119 mg/dL — ABNORMAL HIGH (ref 65–99)
POTASSIUM: 4.5 mmol/L (ref 3.5–5.2)
SODIUM: 139 mmol/L (ref 134–144)
Total Protein: 6.8 g/dL (ref 6.0–8.5)

## 2016-02-05 LAB — TSH: TSH: 1.64 u[IU]/mL (ref 0.450–4.500)

## 2016-02-05 LAB — CBC WITH DIFFERENTIAL/PLATELET
BASOS: 0 %
Basophils Absolute: 0 10*3/uL (ref 0.0–0.2)
EOS (ABSOLUTE): 0.1 10*3/uL (ref 0.0–0.4)
EOS: 2 %
HEMATOCRIT: 39.7 % (ref 34.0–46.6)
Hemoglobin: 12.9 g/dL (ref 11.1–15.9)
Immature Grans (Abs): 0 10*3/uL (ref 0.0–0.1)
Immature Granulocytes: 0 %
LYMPHS ABS: 1.8 10*3/uL (ref 0.7–3.1)
Lymphs: 35 %
MCH: 29.3 pg (ref 26.6–33.0)
MCHC: 32.5 g/dL (ref 31.5–35.7)
MCV: 90 fL (ref 79–97)
MONOS ABS: 0.5 10*3/uL (ref 0.1–0.9)
Monocytes: 9 %
NEUTROS PCT: 54 %
Neutrophils Absolute: 2.8 10*3/uL (ref 1.4–7.0)
PLATELETS: 228 10*3/uL (ref 150–379)
RBC: 4.41 x10E6/uL (ref 3.77–5.28)
RDW: 14.4 % (ref 12.3–15.4)
WBC: 5.3 10*3/uL (ref 3.4–10.8)

## 2016-02-05 LAB — HEMOGLOBIN A1C
ESTIMATED AVERAGE GLUCOSE: 131 mg/dL
HEMOGLOBIN A1C: 6.2 % — AB (ref 4.8–5.6)

## 2016-02-08 LAB — MICROALBUMIN / CREATININE URINE RATIO
Creatinine, Urine: 77.3 mg/dL
MICROALBUM., U, RANDOM: 13.9 ug/mL
Microalb/Creat Ratio: 18 mg/g creat (ref 0.0–30.0)

## 2016-02-11 ENCOUNTER — Ambulatory Visit: Payer: Medicare Other | Admitting: Internal Medicine

## 2016-03-14 DIAGNOSIS — E119 Type 2 diabetes mellitus without complications: Secondary | ICD-10-CM | POA: Diagnosis not present

## 2016-03-14 DIAGNOSIS — H35363 Drusen (degenerative) of macula, bilateral: Secondary | ICD-10-CM | POA: Diagnosis not present

## 2016-03-15 ENCOUNTER — Other Ambulatory Visit: Payer: Self-pay | Admitting: Internal Medicine

## 2016-03-15 DIAGNOSIS — Z1231 Encounter for screening mammogram for malignant neoplasm of breast: Secondary | ICD-10-CM

## 2016-03-16 ENCOUNTER — Ambulatory Visit (INDEPENDENT_AMBULATORY_CARE_PROVIDER_SITE_OTHER): Payer: Medicare Other | Admitting: Family Medicine

## 2016-03-16 ENCOUNTER — Encounter: Payer: Self-pay | Admitting: Family Medicine

## 2016-03-16 VITALS — BP 120/70 | HR 88 | Ht 63.0 in | Wt 250.0 lb

## 2016-03-16 DIAGNOSIS — B379 Candidiasis, unspecified: Secondary | ICD-10-CM

## 2016-03-16 DIAGNOSIS — N342 Other urethritis: Secondary | ICD-10-CM

## 2016-03-16 LAB — POCT URINALYSIS DIPSTICK
Bilirubin, UA: NEGATIVE
Blood, UA: NEGATIVE
Glucose, UA: NEGATIVE
KETONES UA: NEGATIVE
LEUKOCYTES UA: NEGATIVE
NITRITE UA: NEGATIVE
PH UA: 5
PROTEIN UA: NEGATIVE
Spec Grav, UA: 1.01
UROBILINOGEN UA: 0.2

## 2016-03-16 MED ORDER — FLUCONAZOLE 150 MG PO TABS: 150.0000 mg | ORAL_TABLET | Freq: Once | ORAL | 0 refills | Status: AC

## 2016-03-16 MED ORDER — NITROFURANTOIN MONOHYD MACRO 100 MG PO CAPS: 100.0000 mg | ORAL_CAPSULE | Freq: Two times a day (BID) | ORAL | 0 refills | Status: DC

## 2016-03-16 NOTE — Progress Notes (Signed)
Name: Nancy Chen   MRN: [id number]    DOB: 02/23/1945   Date:03/16/2016       Progress Note  Subjective  Chief Complaint  Chief Complaint  Patient presents with  . Urinary Tract Infection    pressure when urinating also burning. Noticed blood on toilet paper    Urinary Tract Infection   This is a new problem. The current episode started in the past 7 days. The problem occurs intermittently. The problem has been waxing and waning. The quality of the pain is described as burning. The pain is mild. There has been no fever. Associated symptoms include frequency, hematuria and urgency. Pertinent negatives include no chills, discharge, flank pain, hesitancy, nausea, sweats or vomiting. The treatment provided moderate relief.    No problem-specific Assessment & Plan notes found for this encounter.   No past medical history on file.  Past Surgical History:  Procedure Laterality Date  . ABDOMINAL HYSTERECTOMY    . CARPAL TUNNEL RELEASE Bilateral   . COLONOSCOPY  06/2012   normal (in [place])    Family History  Problem Relation Age of Onset  . CAD Father   . Breast cancer Maternal Grandmother     great gm    Social History   Social History  . Marital status: Widowed    Spouse name: N/A  . Number of children: N/A  . Years of education: N/A   Occupational History  . Not on file.   Social History Main Topics  . Smoking status: Never Smoker  . Smokeless tobacco: Never Used  . Alcohol use 1.2 oz/week    2 Standard drinks or equivalent per week  . Drug use: No  . Sexual activity: Not on file   Other Topics Concern  . Not on file   Social History Narrative  . No narrative on file    Allergies  Allergen Reactions  . Codeine Hives  . Glipizide Swelling    Outpatient Medications Prior to Visit  Medication Sig Dispense Refill  . albuterol (PROVENTIL HFA;VENTOLIN HFA) 108 (90 Base) MCG/ACT inhaler Inhale 2 puffs into the lungs every 6 (six) hours as needed for wheezing or  shortness of breath. 1 Inhaler 0  . aspirin (ECOTRIN LOW STRENGTH) 81 MG EC tablet Take 1 tablet by mouth daily.    . cetirizine (ZYRTEC) 10 MG tablet Take 10 mg by mouth daily.    . cyanocobalamin 1000 MCG tablet Take 1 tablet by mouth daily.    Marland Kitchen glucose blood (ACCU-CHEK AVIVA) test strip     . lisinopril-hydrochlorothiazide (PRINZIDE,ZESTORETIC) 20-12.5 MG tablet TAKE 1 TABLET EVERY DAY 90 tablet 3  . metFORMIN (GLUCOPHAGE) 1000 MG tablet TAKE 1 TABLET TWICE DAILY 180 tablet 3  . Omega-3 Fatty Acids (FISH OIL) 1200 MG CPDR Take 1 capsule by mouth 2 (two) times daily.    . pioglitazone (ACTOS) 15 MG tablet Take 1 tablet (15 mg total) by mouth daily. 90 tablet 3  . Pyridoxine HCl (VITAMIN B6) 200 MG TABS Take 1 tablet by mouth daily.    . simvastatin (ZOCOR) 20 MG tablet TAKE 1 TABLET EVERY DAY 90 tablet 3  . sitaGLIPtin (JANUVIA) 100 MG tablet Take 1 tablet by mouth daily.     Facility-Administered Medications Prior to Visit  Medication Dose Route Frequency Provider Last Rate Last Dose  . albuterol (PROVENTIL) (2.5 MG/3ML) 0.083% nebulizer solution 2.5 mg  2.5 mg Nebulization Once Juline Patch, MD        Review of  Systems  Constitutional: Negative for chills, fever, malaise/fatigue and weight loss.  HENT: Negative for ear discharge, ear pain and sore throat.   Eyes: Negative for blurred vision.  Respiratory: Negative for cough, sputum production, shortness of breath and wheezing.   Cardiovascular: Negative for chest pain, palpitations and leg swelling.  Gastrointestinal: Negative for abdominal pain, blood in stool, constipation, diarrhea, heartburn, melena, nausea and vomiting.  Genitourinary: Positive for frequency, hematuria and urgency. Negative for dysuria, flank pain and hesitancy.  Musculoskeletal: Negative for back pain, joint pain, myalgias and neck pain.  Skin: Negative for rash.  Neurological: Negative for dizziness, tingling, sensory change, focal weakness and headaches.   Endo/Heme/Allergies: Negative for environmental allergies and polydipsia. Does not bruise/bleed easily.  Psychiatric/Behavioral: Negative for depression and suicidal ideas. The patient is not nervous/anxious and does not have insomnia.      Objective  Vitals:   03/16/16 1042  BP: 120/70  Pulse: 88  Weight: 250 lb (113.4 kg)  Height: 5\' 3"  (1.6 m)    Physical Exam  Constitutional: She is well-developed, well-nourished, and in no distress. No distress.  HENT:  Head: Normocephalic and atraumatic.  Right Ear: External ear normal.  Left Ear: External ear normal.  Nose: Nose normal.  Mouth/Throat: Oropharynx is clear and moist.  Eyes: Conjunctivae and EOM are normal. Pupils are equal, round, and reactive to light. Right eye exhibits no discharge. Left eye exhibits no discharge.  Neck: Normal range of motion. Neck supple. No JVD present. No thyromegaly present.  Cardiovascular: Normal rate, regular rhythm, normal heart sounds and intact distal pulses.  Exam reveals no gallop and no friction rub.   No murmur heard. Pulmonary/Chest: Effort normal and breath sounds normal. She has no wheezes. She has no rales.  Abdominal: Soft. Bowel sounds are normal. She exhibits no mass. There is no tenderness. There is no guarding.  Musculoskeletal: Normal range of motion. She exhibits no edema.  Lymphadenopathy:    She has no cervical adenopathy.  Neurological: She is alert. She has normal reflexes.  Skin: Skin is warm and dry. She is not diaphoretic.  Psychiatric: Mood and affect normal.      Assessment & Plan  Problem List Items Addressed This Visit    None    Visit Diagnoses    Urethritis    -  Primary   Relevant Medications   nitrofurantoin, macrocrystal-monohydrate, (MACROBID) 100 MG capsule   Other Relevant Orders   POCT Urinalysis Dipstick (Completed)   Candida infection       Relevant Medications   fluconazole (DIFLUCAN) 150 MG tablet      Meds ordered this encounter   Medications  . nitrofurantoin, macrocrystal-monohydrate, (MACROBID) 100 MG capsule    Sig: Take 1 capsule (100 mg total) by mouth 2 (two) times daily.    Dispense:  6 capsule    Refill:  0  . fluconazole (DIFLUCAN) 150 MG tablet    Sig: Take 1 tablet (150 mg total) by mouth once.    Dispense:  1 tablet    Refill:  0      Dr. Otilio Miu [facility]  03/16/16

## 2016-03-21 ENCOUNTER — Ambulatory Visit
Admission: RE | Admit: 2016-03-21 | Discharge: 2016-03-21 | Disposition: A | Discharge status: Home / Self Care | Payer: Medicare Other | Source: Ambulatory Visit | Attending: Internal Medicine | Admitting: Internal Medicine

## 2016-03-21 DIAGNOSIS — Z1231 Encounter for screening mammogram for malignant neoplasm of breast: Secondary | ICD-10-CM | POA: Diagnosis present

## 2016-03-29 ENCOUNTER — Telehealth: Payer: Self-pay

## 2016-03-29 NOTE — Telephone Encounter (Signed)
Patient came by asking if we can send her refills to [facility] [place]  Metformin Lisinopril Simvastatin

## 2016-04-03 ENCOUNTER — Other Ambulatory Visit: Payer: Self-pay | Admitting: Internal Medicine

## 2016-04-03 ENCOUNTER — Telehealth: Payer: Self-pay

## 2016-04-03 MED ORDER — SIMVASTATIN 20 MG PO TABS: 20.0000 mg | ORAL_TABLET | Freq: Every day | ORAL | 1 refills | Status: DC

## 2016-04-03 MED ORDER — LISINOPRIL-HYDROCHLOROTHIAZIDE 20-12.5 MG PO TABS: 1.0000 | ORAL_TABLET | Freq: Every day | ORAL | 1 refills | Status: DC

## 2016-04-03 MED ORDER — METFORMIN HCL 1000 MG PO TABS: 1000.0000 mg | ORAL_TABLET | Freq: Two times a day (BID) | ORAL | 1 refills | Status: DC

## 2016-04-03 NOTE — Telephone Encounter (Signed)
Needs Metformin, Lisinopril, and Simvastatin sent to [facility] in [place] due to insurance change.

## 2016-04-03 NOTE — Telephone Encounter (Signed)
[facility] to [facility]

## 2016-04-03 NOTE — Telephone Encounter (Signed)
Pt informed

## 2016-06-19 DIAGNOSIS — Z872 Personal history of diseases of the skin and subcutaneous tissue: Secondary | ICD-10-CM | POA: Diagnosis not present

## 2016-06-19 DIAGNOSIS — Z86018 Personal history of other benign neoplasm: Secondary | ICD-10-CM | POA: Diagnosis not present

## 2016-06-19 DIAGNOSIS — L821 Other seborrheic keratosis: Secondary | ICD-10-CM | POA: Diagnosis not present

## 2016-06-19 DIAGNOSIS — Z808 Family history of malignant neoplasm of other organs or systems: Secondary | ICD-10-CM | POA: Diagnosis not present

## 2016-06-26 ENCOUNTER — Ambulatory Visit (INDEPENDENT_AMBULATORY_CARE_PROVIDER_SITE_OTHER): Payer: Medicare Other

## 2016-06-26 VITALS — BP 112/66 | HR 72 | Temp 97.8°F | Resp 16 | Ht 63.0 in | Wt 226.2 lb

## 2016-06-26 DIAGNOSIS — Z0000 Encounter for general adult medical examination without abnormal findings: Secondary | ICD-10-CM

## 2016-06-26 DIAGNOSIS — Z23 Encounter for immunization: Secondary | ICD-10-CM

## 2016-06-26 NOTE — Progress Notes (Signed)
Subjective:   RAYETTA VEITH is a 71 y.o. female who presents for Medicare Annual (Subsequent) preventive examination.  Review of Systems:   Cardiac Risk Factors include: advanced age (>69men, >49 women);obesity (BMI >30kg/m2);diabetes mellitus;hypertension     Objective:     Vitals: BP 112/66 (BP Location: Right Arm, Patient Position: Sitting)   Pulse 72   Temp 97.8 F (36.6 C)   Resp 16   Ht 5\' 3"  (1.6 m)   Wt 226 lb 3.2 oz (102.6 kg)   BMI 40.07 kg/m   Body mass index is 40.07 kg/m.   Tobacco History  Smoking Status  . Never Smoker  Smokeless Tobacco  . Never Used     Counseling given: Not Answered   History reviewed. No pertinent past medical history. Past Surgical History:  Procedure Laterality Date  . ABDOMINAL HYSTERECTOMY    . CARPAL TUNNEL RELEASE Bilateral   . COLONOSCOPY  06/2012   normal (in [place])   Family History  Problem Relation Age of Onset  . CAD Father   . Breast cancer Maternal Grandmother        great gm   History  Sexual Activity  . Sexual activity: Not on file    Outpatient Encounter Prescriptions as of 06/26/2016  Medication Sig  . aspirin (ECOTRIN LOW STRENGTH) 81 MG EC tablet Take 1 tablet by mouth daily.  . cyanocobalamin 1000 MCG tablet Take 1 tablet by mouth daily.  Marland Kitchen glucose blood (ACCU-CHEK AVIVA) test strip   . lisinopril-hydrochlorothiazide (PRINZIDE,ZESTORETIC) 20-12.5 MG tablet Take 1 tablet by mouth daily.  . metFORMIN (GLUCOPHAGE) 1000 MG tablet Take 1 tablet (1,000 mg total) by mouth 2 (two) times daily.  . Omega-3 Fatty Acids (FISH OIL) 1200 MG CPDR Take 1 capsule by mouth 2 (two) times daily.  . pioglitazone (ACTOS) 15 MG tablet Take 1 tablet (15 mg total) by mouth daily.  . Pyridoxine HCl (VITAMIN B6) 200 MG TABS Take 1 tablet by mouth daily.  . simvastatin (ZOCOR) 20 MG tablet Take 1 tablet (20 mg total) by mouth daily.  . sitaGLIPtin (JANUVIA) 100 MG tablet Take 1 tablet by mouth daily.  . Turmeric 500 MG CAPS  Take by mouth.  Marland Kitchen albuterol (PROVENTIL HFA;VENTOLIN HFA) 108 (90 Base) MCG/ACT inhaler Inhale 2 puffs into the lungs every 6 (six) hours as needed for wheezing or shortness of breath. (Patient not taking: Reported on 06/26/2016)  . cetirizine (ZYRTEC) 10 MG tablet Take 10 mg by mouth daily.  . [DISCONTINUED] nitrofurantoin, macrocrystal-monohydrate, (MACROBID) 100 MG capsule Take 1 capsule (100 mg total) by mouth 2 (two) times daily.   Facility-Administered Encounter Medications as of 06/26/2016  Medication  . albuterol (PROVENTIL) (2.5 MG/3ML) 0.083% nebulizer solution 2.5 mg    Activities of Daily Living In your present state of health, do you have any difficulty performing the following activities: 06/26/2016  Hearing? N  Vision? N  Difficulty concentrating or making decisions? N  Walking or climbing stairs? Y  Dressing or bathing? N  Doing errands, shopping? N  Preparing Food and eating ? N  Using the Toilet? N  In the past six months, have you accidently leaked urine? Y  Do you have problems with loss of bowel control? N  Managing your Medications? N  Managing your Finances? N  Housekeeping or managing your Housekeeping? N  Some recent data might be hidden    Patient Care Team: Glean Hess, MD as PCP - General (Family Medicine) Rozelle Logan, Lavon Paganini,  MD as Referring Physician (Orthopedic Surgery)    Assessment:     Exercise Activities and Dietary recommendations Current Exercise Habits: Structured exercise class, Time (Minutes): 60, Frequency (Times/Week): 4, Weekly Exercise (Minutes/Week): 240, Intensity: Moderate, Exercise limited by: orthopedic condition(s)  Goals    . Increase water intake          Recommend to continue drinking at least 5-6 glasses of water a day      Fall Risk Fall Risk  06/26/2016 10/11/2015 06/11/2015 02/11/2015 11/10/2014  Falls in the past year? No No No No No   Depression Screen PHQ 2/9 Scores 06/26/2016 10/11/2015 06/11/2015 02/11/2015  PHQ - 2  Score 0 0 0 0     Cognitive Function     6CIT Screen 06/26/2016  What Year? 0 points  What month? 0 points  What time? 0 points  Count back from 20 0 points  Months in reverse 0 points  Repeat phrase 0 points  Total Score 0    Immunization History  Administered Date(s) Administered  . Hepatitis B 07/04/2011  . Hepatitis B, adult 07/04/2011  . Influenza,inj,Quad PF,36+ Mos 09/29/2013, 09/29/2014, 10/11/2015  . Pneumococcal Conjugate-13 02/11/2015  . Pneumococcal Polysaccharide-23 01/10/2005, 06/26/2016  . Tdap 03/09/2012   Screening Tests Health Maintenance  Topic Date Due  . FOOT EXAM  02/11/2016  . HEMOGLOBIN A1C  08/03/2016  . INFLUENZA VACCINE  08/09/2016  . OPHTHALMOLOGY EXAM  03/15/2017  . MAMMOGRAM  03/22/2018  . TETANUS/TDAP  03/10/2022  . COLONOSCOPY  06/24/2022  . DEXA SCAN  Completed  . Hepatitis C Screening  Completed  . PNA vac Low Risk Adult  Completed      Plan:    I have personally reviewed and addressed the Medicare Annual Wellness questionnaire and have noted the following in the patient's chart:  A. Medical and social history B. Use of alcohol, tobacco or illicit drugs  C. Current medications and supplements D. Functional ability and status E.  Nutritional status F.  Physical activity G. Advance directives H. List of other physicians I.  Hospitalizations, surgeries, and ER visits in previous 12 months J.  [place] such as hearing and vision if needed, cognitive and depression L. Referrals and appointments   In addition, I have reviewed and discussed with patient certain preventive protocols, quality metrics, and best practice recommendations. A written personalized care plan for preventive services as well as general preventive health recommendations were provided to patient.   Signed,  Tyler Aas, LPN Nurse Health Advisor   MD Recommendations: due for diabetic foot exam

## 2016-06-26 NOTE — Patient Instructions (Addendum)
Ms. Nancy Chen , Thank you for taking time to come for your Medicare Wellness Visit. I appreciate your ongoing commitment to your health goals. Please review the following plan we discussed and let me know if I can assist you in the future.   Screening recommendations/referrals: Colonoscopy: completed 06/10/2012, due 06/2022 Mammogram: completed 03/21/2016, due 03/2017 Bone Density:  Completed 11/12/2014 Recommended yearly ophthalmology/optometry visit for glaucoma screening and checkup Recommended yearly dental visit for hygiene and checkup  Vaccinations: Influenza vaccine: up to date, due 10/208 Pneumococcal vaccine: pneumovax 23 done today, completed series Tdap vaccine: up to date Shingles vaccine: up to date  Advanced directives: Advanced directives in chart, no changes requested   Conditions/risks identified: Recommend to continue drinking at least 5-6 glasses of water a day  Next appointment: Follow up with Dr.Berlund on 06/27/2016 at 10:30am. Follow up in one year for your annual wellness exam.    Preventive Care 65 Years and Older, Female Preventive care refers to lifestyle choices and visits with your health care provider that can promote health and wellness. What does preventive care include?  A yearly physical exam. This is also called an annual well check.  Dental exams once or twice a year.  Routine eye exams. Ask your health care provider how often you should have your eyes checked.  Personal lifestyle choices, including:  Daily care of your teeth and gums.  Regular physical activity.  Eating a healthy diet.  Avoiding tobacco and drug use.  Limiting alcohol use.  Practicing safe sex.  Taking low-dose aspirin every day.  Taking vitamin and mineral supplements as recommended by your health care provider. What happens during an annual well check? The services and screenings done by your health care provider during your annual well check will depend on your age,  overall health, lifestyle risk factors, and family history of disease. Counseling  Your health care provider may ask you questions about your:  Alcohol use.  Tobacco use.  Drug use.  Emotional well-being.  Home and relationship well-being.  Sexual activity.  Eating habits.  History of falls.  Memory and ability to understand (cognition).  Work and work Statistician.  Reproductive health. Screening  You may have the following tests or measurements:  Height, weight, and BMI.  Blood pressure.  Lipid and cholesterol levels. These may be checked every 5 years, or more frequently if you are over 30 years old.  Skin check.  Lung cancer screening. You may have this screening every year starting at age 24 if you have a 30-pack-year history of smoking and currently smoke or have quit within the past 15 years.  Fecal occult blood test (FOBT) of the stool. You may have this test every year starting at age 77.  Flexible sigmoidoscopy or colonoscopy. You may have a sigmoidoscopy every 5 years or a colonoscopy every 10 years starting at age 57.  Hepatitis C blood test.  Hepatitis B blood test.  Sexually transmitted disease (STD) testing.  Diabetes screening. This is done by checking your blood sugar (glucose) after you have not eaten for a while (fasting). You may have this done every 1-3 years.  Bone density scan. This is done to screen for osteoporosis. You may have this done starting at age 86.  Mammogram. This may be done every 1-2 years. Talk to your health care provider about how often you should have regular mammograms. Talk with your health care provider about your test results, treatment options, and if necessary, the need for more tests.  Vaccines  Your health care provider may recommend certain vaccines, such as:  Influenza vaccine. This is recommended every year.  Tetanus, diphtheria, and acellular pertussis (Tdap, Td) vaccine. You may need a Td booster every 10  years.  Zoster vaccine. You may need this after age 11.  Pneumococcal 13-valent conjugate (PCV13) vaccine. One dose is recommended after age 54.  Pneumococcal polysaccharide (PPSV23) vaccine. One dose is recommended after age 33. Talk to your health care provider about which screenings and vaccines you need and how often you need them. This information is not intended to replace advice given to you by your health care provider. Make sure you discuss any questions you have with your health care provider. Document Released: 01/22/2015 Document Revised: 09/15/2015 Document Reviewed: 10/27/2014 Elsevier Interactive Patient Education  2017 [employer] Prevention in the Home Falls can cause injuries. They can happen to people of all ages. There are many things you can do to make your home safe and to help prevent falls. What can I do on the outside of my home?  Regularly fix the edges of walkways and driveways and fix any cracks.  Remove anything that might make you trip as you walk through a door, such as a raised step or threshold.  Trim any bushes or trees on the path to your home.  Use bright outdoor lighting.  Clear any walking paths of anything that might make someone trip, such as rocks or tools.  Regularly check to see if handrails are loose or broken. Make sure that both sides of any steps have handrails.  Any raised decks and porches should have guardrails on the edges.  Have any leaves, snow, or ice cleared regularly.  Use sand or salt on walking paths during winter.  Clean up any spills in your garage right away. This includes oil or grease spills. What can I do in the bathroom?  Use night lights.  Install grab bars by the toilet and in the tub and shower. Do not use towel bars as grab bars.  Use non-skid mats or decals in the tub or shower.  If you need to sit down in the shower, use a plastic, non-slip stool.  Keep the floor dry. Clean up any water that  spills on the floor as soon as it happens.  Remove soap buildup in the tub or shower regularly.  Attach bath mats securely with double-sided non-slip rug tape.  Do not have throw rugs and other things on the floor that can make you trip. What can I do in the bedroom?  Use night lights.  Make sure that you have a light by your bed that is easy to reach.  Do not use any sheets or blankets that are too big for your bed. They should not hang down onto the floor.  Have a firm chair that has side arms. You can use this for support while you get dressed.  Do not have throw rugs and other things on the floor that can make you trip. What can I do in the kitchen?  Clean up any spills right away.  Avoid walking on wet floors.  Keep items that you use a lot in easy-to-reach places.  If you need to reach something above you, use a strong step stool that has a grab bar.  Keep electrical cords out of the way.  Do not use floor polish or wax that makes floors slippery. If you must use wax, use non-skid floor wax.  Do not have throw rugs and other things on the floor that can make you trip. What can I do with my stairs?  Do not leave any items on the stairs.  Make sure that there are handrails on both sides of the stairs and use them. Fix handrails that are broken or loose. Make sure that handrails are as long as the stairways.  Check any carpeting to make sure that it is firmly attached to the stairs. Fix any carpet that is loose or worn.  Avoid having throw rugs at the top or bottom of the stairs. If you do have throw rugs, attach them to the floor with carpet tape.  Make sure that you have a light switch at the top of the stairs and the bottom of the stairs. If you do not have them, ask someone to add them for you. What else can I do to help prevent falls?  Wear shoes that:  Do not have high heels.  Have rubber bottoms.  Are comfortable and fit you well.  Are closed at the  toe. Do not wear sandals.  If you use a stepladder:  Make sure that it is fully opened. Do not climb a closed stepladder.  Make sure that both sides of the stepladder are locked into place.  Ask someone to hold it for you, if possible.  Clearly mark and make sure that you can see:  Any grab bars or handrails.  First and last steps.  Where the edge of each step is.  Use tools that help you move around (mobility aids) if they are needed. These include:  Canes.  Walkers.  Scooters.  Crutches.  Turn on the lights when you go into a dark area. Replace any light bulbs as soon as they burn out.  Set up your furniture so you have a clear path. Avoid moving your furniture around.  If any of your floors are uneven, fix them.  If there are any pets around you, be aware of where they are.  Review your medicines with your doctor. Some medicines can make you feel dizzy. This can increase your chance of falling. Ask your doctor what other things that you can do to help prevent falls. This information is not intended to replace advice given to you by your health care provider. Make sure you discuss any questions you have with your health care provider. Document Released: 10/22/2008 Document Revised: 06/03/2015 Document Reviewed: 01/30/2014 Elsevier Interactive Patient Education  2017 [employer].

## 2016-06-27 ENCOUNTER — Ambulatory Visit (INDEPENDENT_AMBULATORY_CARE_PROVIDER_SITE_OTHER): Payer: Medicare Other | Admitting: Internal Medicine

## 2016-06-27 ENCOUNTER — Encounter: Payer: Self-pay | Admitting: Internal Medicine

## 2016-06-27 VITALS — BP 115/68 | HR 74 | Ht 63.0 in | Wt 225.2 lb

## 2016-06-27 DIAGNOSIS — E1100 Type 2 diabetes mellitus with hyperosmolarity without nonketotic hyperglycemic-hyperosmolar coma (NKHHC): Secondary | ICD-10-CM | POA: Diagnosis not present

## 2016-06-27 DIAGNOSIS — E782 Mixed hyperlipidemia: Secondary | ICD-10-CM | POA: Diagnosis not present

## 2016-06-27 DIAGNOSIS — I10 Essential (primary) hypertension: Secondary | ICD-10-CM

## 2016-06-27 LAB — POCT URINALYSIS DIPSTICK
BILIRUBIN UA: NEGATIVE
Blood, UA: NEGATIVE
Glucose, UA: NEGATIVE
KETONES UA: NEGATIVE
NITRITE UA: NEGATIVE
Protein, UA: NEGATIVE
Spec Grav, UA: 1.02 (ref 1.010–1.025)
Urobilinogen, UA: 0.2 E.U./dL
pH, UA: 5 (ref 5.0–8.0)

## 2016-06-27 NOTE — Patient Instructions (Addendum)
Lisinopril 20/12.5 - reduce to 1/2 per day   Stop Actos  Call the diabetic supply company about a meter and supplies

## 2016-06-27 NOTE — Progress Notes (Signed)
Date:  06/27/2016   Name:  Nancy Chen   DOB:  10/29/1945   MRN:  [id number]   Chief Complaint: Diabetes (Urine gave just incase mircoalbumin is needed. Pt complaint is of dizziness and nausea. Unsure if BP or Diabetes is causing the medication. She states " Having several episode in the last week and a half." Feeling faint and having sweats. Not everyday thing but catches off gaurd when happening. ) Diabetes  Pertinent negatives for hypoglycemia include no dizziness, headaches or tremors. Pertinent negatives for diabetes include no chest pain, no fatigue, no polydipsia and no polyuria. Current diabetic treatment includes oral agent (triple therapy) (metformin, actos, [place]). Her weight is decreasing steadily. An ACE inhibitor/angiotensin II receptor blocker is being taken.  Hypertension  This is a chronic problem. The problem is unchanged. The problem is controlled. Pertinent negatives include no chest pain, headaches, palpitations or shortness of breath. Past treatments include ACE inhibitors and diuretics. There are no compliance problems.   She does not check her blood sugar - does not have test strips.  She has lost 30 lbs and likely has episodes of low BS since they improve with eating.  Lab Results  Component Value Date   HGBA1C 6.2 (H) 02/04/2016     Review of Systems  Constitutional: Negative for chills, fatigue and fever.  Eyes: Negative for visual disturbance.  Respiratory: Negative for chest tightness, shortness of breath and wheezing.   Cardiovascular: Negative for chest pain and palpitations.  Gastrointestinal: Negative for abdominal pain.  Endocrine: Negative for polydipsia and polyuria.  Genitourinary: Negative for dysuria.  Musculoskeletal: Positive for arthralgias and gait problem.  Skin: Negative for rash.  Neurological: Positive for light-headedness. Negative for dizziness, tremors and headaches.    Patient Active Problem List   Diagnosis Date Noted  .  Sleep disorder 02/04/2016  . Sleep disorder breathing 10/11/2015  . Lipoma of left upper extremity 10/11/2015  . Uncontrolled type 2 diabetes mellitus with hyperosmolarity without coma, without long-term current use of insulin ([place]) 02/11/2015  . Ovarian failure 11/10/2014  . Long-term use of high-risk medication 11/10/2014  . Essential (primary) hypertension 07/15/2014  . Calcium blood increased 07/15/2014  . Mixed hyperlipidemia 07/15/2014  . Dermatophytosis of groin 07/15/2014  . Tricompartment osteoarthritis of knee 07/15/2014    Prior to Admission medications   Medication Sig Start Date End Date Taking? Authorizing Provider  albuterol (PROVENTIL HFA;VENTOLIN HFA) 108 (90 Base) MCG/ACT inhaler Inhale 2 puffs into the lungs every 6 (six) hours as needed for wheezing or shortness of breath. 04/02/15  Yes Juline Patch, MD  aspirin (ECOTRIN LOW STRENGTH) 81 MG EC tablet Take 1 tablet by mouth daily.   Yes [provider]  cetirizine (ZYRTEC) 10 MG tablet Take 10 mg by mouth daily.   Yes [provider]  cyanocobalamin 1000 MCG tablet Take 1 tablet by mouth daily.   Yes [provider]  glucose blood (ACCU-CHEK AVIVA) test strip  01/29/13  Yes [provider]  lisinopril-hydrochlorothiazide (PRINZIDE,ZESTORETIC) 20-12.5 MG tablet Take 1 tablet by mouth daily. 04/03/16  Yes Glean Hess, MD  metFORMIN (GLUCOPHAGE) 1000 MG tablet Take 1 tablet (1,000 mg total) by mouth 2 (two) times daily. 04/03/16  Yes Glean Hess, MD  Omega-3 Fatty Acids (FISH OIL) 1200 MG CPDR Take 1 capsule by mouth 2 (two) times daily.   Yes [provider]  pioglitazone (ACTOS) 15 MG tablet Take 1 tablet (15 mg total) by mouth daily. 02/04/16  Yes Glean Hess, MD  Pyridoxine HCl (VITAMIN B6) 200 MG TABS Take 1 tablet by mouth daily.   Yes [provider]  simvastatin (ZOCOR) 20 MG tablet Take 1 tablet (20 mg total) by mouth daily. 04/03/16  Yes Glean Hess, MD  sitaGLIPtin (JANUVIA) 100 MG tablet Take 1 tablet by mouth daily.   Yes [provider]  Turmeric 500 MG CAPS Take by mouth.   Yes [provider]    Allergies  Allergen Reactions  . Codeine Hives  . Glipizide Swelling    Past Surgical History:  Procedure Laterality Date  . ABDOMINAL HYSTERECTOMY    . CARPAL TUNNEL RELEASE Bilateral   . COLONOSCOPY  06/2012   normal (in [facility])    Social History  Substance Use Topics  . Smoking status: Never Smoker  . Smokeless tobacco: Never Used  . Alcohol use 1.2 oz/week    2 Standard drinks or equivalent per week     Comment: 1 glass of wine a month      Medication list has been reviewed and updated.   Physical Exam  Constitutional: She is oriented to person, place, and time. She appears well-developed. No distress.  HENT:  Head: Normocephalic and atraumatic.  Neck: Normal range of motion. Neck supple. Carotid bruit is not present. No thyromegaly present.  Pulmonary/Chest: Effort normal. No respiratory distress.  Abdominal: Soft.  Musculoskeletal: She exhibits no edema.  Neurological: She is alert and oriented to person, place, and time.  Skin: Skin is warm and dry. No rash noted.  Psychiatric: She has a normal mood and affect. Her behavior is normal. Thought content normal.  Nursing note and vitals reviewed.   BP 115/68 (Patient Position: Sitting)   Pulse 74   Ht 5\' 3"  (1.6 m)   Wt 225 lb 3.2 oz (102.2 kg)   SpO2 100%   BMI 39.89 kg/m   Assessment and Plan: 1. Essential (primary) hypertension Orthostatic changes due to weight loss - cut dose in half - CBC with Differential/Platelet  2. Uncontrolled type 2 diabetes mellitus with hyperosmolarity without coma, without long-term current use of insulin (HCC) Stop Actos Begin checking BS - pt will contact [facility] - Hemoglobin A1c - Comprehensive metabolic panel - POCT urinalysis dipstick  3. Mixed hyperlipidemia Continue statins - Lipid  panel   No orders of the defined types were placed in this encounter.   Halina Maidens, MD [facility]  06/27/2016

## 2016-06-28 LAB — COMPREHENSIVE METABOLIC PANEL
A/G RATIO: 2.1 (ref 1.2–2.2)
ALBUMIN: 4.6 g/dL (ref 3.6–4.8)
ALK PHOS: 50 IU/L (ref 39–117)
ALT: 16 IU/L (ref 0–32)
AST: 19 IU/L (ref 0–40)
BUN / CREAT RATIO: 29 — AB (ref 12–28)
BUN: 21 mg/dL (ref 8–27)
Bilirubin Total: 0.4 mg/dL (ref 0.0–1.2)
CHLORIDE: 100 mmol/L (ref 96–106)
CO2: 24 mmol/L (ref 20–29)
Calcium: 10 mg/dL (ref 8.7–10.3)
Creatinine, Ser: 0.73 mg/dL (ref 0.57–1.00)
GFR calc Af Amer: 97 mL/min/{1.73_m2} (ref >59)
GFR calc non Af Amer: 84 mL/min/{1.73_m2} (ref >59)
GLUCOSE: 97 mg/dL (ref 65–99)
Globulin, Total: 2.2 g/dL (ref 1.5–4.5)
POTASSIUM: 4.3 mmol/L (ref 3.5–5.2)
Sodium: 140 mmol/L (ref 134–144)
Total Protein: 6.8 g/dL (ref 6.0–8.5)

## 2016-06-28 LAB — CBC WITH DIFFERENTIAL/PLATELET
BASOS: 0 %
Basophils Absolute: 0 10*3/uL (ref 0.0–0.2)
EOS (ABSOLUTE): 0.1 10*3/uL (ref 0.0–0.4)
EOS: 1 %
HEMATOCRIT: 40.3 % (ref 34.0–46.6)
HEMOGLOBIN: 12.8 g/dL (ref 11.1–15.9)
IMMATURE GRANS (ABS): 0 10*3/uL (ref 0.0–0.1)
IMMATURE GRANULOCYTES: 0 %
Lymphocytes Absolute: 2.1 10*3/uL (ref 0.7–3.1)
Lymphs: 21 %
MCH: 28.5 pg (ref 26.6–33.0)
MCHC: 31.8 g/dL (ref 31.5–35.7)
MCV: 90 fL (ref 79–97)
MONOCYTES: 7 %
MONOS ABS: 0.7 10*3/uL (ref 0.1–0.9)
NEUTROS PCT: 71 %
Neutrophils Absolute: 6.7 10*3/uL (ref 1.4–7.0)
Platelets: 268 10*3/uL (ref 150–379)
RBC: 4.49 x10E6/uL (ref 3.77–5.28)
RDW: 14.4 % (ref 12.3–15.4)
WBC: 9.6 10*3/uL (ref 3.4–10.8)

## 2016-06-28 LAB — LIPID PANEL
CHOLESTEROL TOTAL: 127 mg/dL (ref 100–199)
Chol/HDL Ratio: 2.7 ratio (ref 0.0–4.4)
HDL: 47 mg/dL (ref >39)
LDL Calculated: 57 mg/dL (ref 0–99)
Triglycerides: 114 mg/dL (ref 0–149)
VLDL CHOLESTEROL CAL: 23 mg/dL (ref 5–40)

## 2016-06-28 LAB — HEMOGLOBIN A1C
ESTIMATED AVERAGE GLUCOSE: 120 mg/dL
Hgb A1c MFr Bld: 5.8 % — ABNORMAL HIGH (ref 4.8–5.6)

## 2016-06-29 ENCOUNTER — Encounter: Payer: Self-pay | Admitting: Internal Medicine

## 2016-07-31 DIAGNOSIS — E119 Type 2 diabetes mellitus without complications: Secondary | ICD-10-CM | POA: Diagnosis not present

## 2016-08-25 DIAGNOSIS — E119 Type 2 diabetes mellitus without complications: Secondary | ICD-10-CM | POA: Diagnosis not present

## 2016-08-25 DIAGNOSIS — H2513 Age-related nuclear cataract, bilateral: Secondary | ICD-10-CM | POA: Diagnosis not present

## 2016-08-25 DIAGNOSIS — H02826 Cysts of left eye, unspecified eyelid: Secondary | ICD-10-CM | POA: Diagnosis not present

## 2016-08-30 ENCOUNTER — Encounter: Payer: Self-pay | Admitting: *Deleted

## 2016-09-07 ENCOUNTER — Encounter
Admission: RE | Disposition: A | Discharge status: Home / Self Care | Payer: Self-pay | Source: Ambulatory Visit | Attending: Ophthalmology

## 2016-09-07 ENCOUNTER — Ambulatory Visit: Payer: Medicare Other | Admitting: Certified Registered Nurse Anesthetist

## 2016-09-07 ENCOUNTER — Encounter: Payer: Self-pay | Admitting: *Deleted

## 2016-09-07 ENCOUNTER — Ambulatory Visit
Admission: RE | Admit: 2016-09-07 | Discharge: 2016-09-07 | Disposition: A | Discharge status: Home / Self Care | Payer: Medicare Other | Source: Ambulatory Visit | Attending: Ophthalmology | Admitting: Ophthalmology

## 2016-09-07 DIAGNOSIS — Z7982 Long term (current) use of aspirin: Secondary | ICD-10-CM | POA: Insufficient documentation

## 2016-09-07 DIAGNOSIS — Z87891 Personal history of nicotine dependence: Secondary | ICD-10-CM | POA: Insufficient documentation

## 2016-09-07 DIAGNOSIS — F419 Anxiety disorder, unspecified: Secondary | ICD-10-CM | POA: Diagnosis not present

## 2016-09-07 DIAGNOSIS — Z90710 Acquired absence of both cervix and uterus: Secondary | ICD-10-CM | POA: Insufficient documentation

## 2016-09-07 DIAGNOSIS — Z7984 Long term (current) use of oral hypoglycemic drugs: Secondary | ICD-10-CM | POA: Insufficient documentation

## 2016-09-07 DIAGNOSIS — E119 Type 2 diabetes mellitus without complications: Secondary | ICD-10-CM | POA: Diagnosis not present

## 2016-09-07 DIAGNOSIS — Z885 Allergy status to narcotic agent status: Secondary | ICD-10-CM | POA: Insufficient documentation

## 2016-09-07 DIAGNOSIS — M1990 Unspecified osteoarthritis, unspecified site: Secondary | ICD-10-CM | POA: Diagnosis not present

## 2016-09-07 DIAGNOSIS — Z79899 Other long term (current) drug therapy: Secondary | ICD-10-CM | POA: Diagnosis not present

## 2016-09-07 DIAGNOSIS — H2512 Age-related nuclear cataract, left eye: Secondary | ICD-10-CM | POA: Insufficient documentation

## 2016-09-07 DIAGNOSIS — I10 Essential (primary) hypertension: Secondary | ICD-10-CM | POA: Insufficient documentation

## 2016-09-07 DIAGNOSIS — Z888 Allergy status to other drugs, medicaments and biological substances status: Secondary | ICD-10-CM | POA: Insufficient documentation

## 2016-09-07 DIAGNOSIS — E7800 Pure hypercholesterolemia, unspecified: Secondary | ICD-10-CM | POA: Diagnosis not present

## 2016-09-07 HISTORY — DX: Pure hypercholesterolemia, unspecified: E78.00

## 2016-09-07 HISTORY — DX: Unspecified osteoarthritis, unspecified site: M19.90

## 2016-09-07 HISTORY — DX: Localized edema: R60.0

## 2016-09-07 HISTORY — PX: CATARACT EXTRACTION W/PHACO: SHX586

## 2016-09-07 HISTORY — DX: Anxiety disorder, unspecified: F41.9

## 2016-09-07 HISTORY — DX: Type 2 diabetes mellitus without complications: E11.9

## 2016-09-07 HISTORY — DX: Bronchitis, not specified as acute or chronic: J40

## 2016-09-07 HISTORY — DX: Essential (primary) hypertension: I10

## 2016-09-07 LAB — GLUCOSE, CAPILLARY: GLUCOSE-CAPILLARY: 115 mg/dL — AB (ref 65–99)

## 2016-09-07 SURGERY — PHACOEMULSIFICATION, CATARACT, WITH IOL INSERTION
Anesthesia: Monitor Anesthesia Care | Site: Eye | Laterality: Left | Wound class: Clean

## 2016-09-07 MED ORDER — POVIDONE-IODINE 5 % OP SOLN
OPHTHALMIC | Status: AC
  Filled 2016-09-07: qty 30

## 2016-09-07 MED ORDER — MOXIFLOXACIN HCL 0.5 % OP SOLN
OPHTHALMIC | Status: DC | PRN
Start: 2016-09-07 — End: 2016-09-07
  Administered 2016-09-07: 0.2 mL via OPHTHALMIC

## 2016-09-07 MED ORDER — NA CHONDROIT SULF-NA HYALURON 40-17 MG/ML IO SOLN
INTRAOCULAR | Status: AC
  Filled 2016-09-07: qty 1

## 2016-09-07 MED ORDER — SODIUM CHLORIDE 0.9 % IV SOLN
INTRAVENOUS | Status: DC
  Administered 2016-09-07: 20 mL/h via INTRAVENOUS

## 2016-09-07 MED ORDER — MOXIFLOXACIN HCL 0.5 % OP SOLN: 1.0000 [drp] | OPHTHALMIC | Status: DC | PRN

## 2016-09-07 MED ORDER — LIDOCAINE HCL (PF) 4 % IJ SOLN
INTRAMUSCULAR | Status: DC | PRN
  Administered 2016-09-07: 4 mL via OPHTHALMIC

## 2016-09-07 MED ORDER — LIDOCAINE HCL (PF) 4 % IJ SOLN
INTRAMUSCULAR | Status: AC
  Filled 2016-09-07: qty 5

## 2016-09-07 MED ORDER — EPINEPHRINE PF 1 MG/ML IJ SOLN
INTRAMUSCULAR | Status: AC
  Filled 2016-09-07: qty 1

## 2016-09-07 MED ORDER — ARMC OPHTHALMIC DILATING DROPS
OPHTHALMIC | Status: AC
  Administered 2016-09-07: 1 via OPHTHALMIC
  Filled 2016-09-07: qty 0.4

## 2016-09-07 MED ORDER — MOXIFLOXACIN HCL 0.5 % OP SOLN
OPHTHALMIC | Status: AC
  Filled 2016-09-07: qty 3

## 2016-09-07 MED ORDER — NA CHONDROIT SULF-NA HYALURON 40-30 MG/ML IO SOLN
INTRAOCULAR | Status: DC | PRN
  Administered 2016-09-07: 0.5 mL via INTRAOCULAR

## 2016-09-07 MED ORDER — MIDAZOLAM HCL 2 MG/2ML IJ SOLN
INTRAMUSCULAR | Status: DC | PRN
  Administered 2016-09-07 (×2): 1 mg via INTRAVENOUS

## 2016-09-07 MED ORDER — CARBACHOL 0.01 % IO SOLN
INTRAOCULAR | Status: DC | PRN
  Administered 2016-09-07: 0.5 mL via INTRAOCULAR

## 2016-09-07 MED ORDER — POVIDONE-IODINE 5 % OP SOLN
OPHTHALMIC | Status: DC | PRN
  Administered 2016-09-07: 1 via OPHTHALMIC

## 2016-09-07 MED ORDER — EPINEPHRINE PF 1 MG/ML IJ SOLN
INTRAMUSCULAR | Status: DC | PRN
  Administered 2016-09-07: 08:00:00 via OPHTHALMIC

## 2016-09-07 MED ORDER — FENTANYL CITRATE (PF) 100 MCG/2ML IJ SOLN
INTRAMUSCULAR | Status: DC | PRN
  Administered 2016-09-07 (×4): 25 ug via INTRAVENOUS

## 2016-09-07 MED ORDER — FENTANYL CITRATE (PF) 100 MCG/2ML IJ SOLN
INTRAMUSCULAR | Status: AC
  Filled 2016-09-07: qty 2

## 2016-09-07 MED ORDER — ARMC OPHTHALMIC DILATING DROPS: 1.0000 "application " | OPHTHALMIC | Status: DC | PRN

## 2016-09-07 MED ORDER — EPHEDRINE SULFATE 50 MG/ML IJ SOLN
INTRAMUSCULAR | Status: AC
  Filled 2016-09-07: qty 1

## 2016-09-07 MED ORDER — PHENYLEPHRINE HCL 10 MG/ML IJ SOLN
INTRAMUSCULAR | Status: AC
  Filled 2016-09-07: qty 1

## 2016-09-07 MED ORDER — ARMC OPHTHALMIC DILATING DROPS
1.0000 "application " | OPHTHALMIC | Status: AC
  Administered 2016-09-07: 1 via OPHTHALMIC

## 2016-09-07 MED ORDER — MIDAZOLAM HCL 2 MG/2ML IJ SOLN
INTRAMUSCULAR | Status: AC
  Filled 2016-09-07: qty 2

## 2016-09-07 SURGICAL SUPPLY — 16 items
DISSECTOR HYDRO NUCLEUS 50X22 (MISCELLANEOUS) ×2 IMPLANT
GLOVE BIO SURGEON STRL SZ8 (GLOVE) ×2 IMPLANT
GLOVE BIOGEL M 6.5 STRL (GLOVE) ×2 IMPLANT
GLOVE SURG LX 7.5 STRW (GLOVE) ×1
GLOVE SURG LX STRL 7.5 STRW (GLOVE) ×1 IMPLANT
GOWN STRL REUS W/ TWL LRG LVL3 (GOWN DISPOSABLE) ×2 IMPLANT
GOWN STRL REUS W/TWL LRG LVL3 (GOWN DISPOSABLE) ×2
LABEL CATARACT MEDS ST (LABEL) ×2 IMPLANT
LENS IOL TECNIS ITEC 20.0 (Intraocular Lens) ×2 IMPLANT
PACK CATARACT (MISCELLANEOUS) ×2 IMPLANT
PACK CATARACT KING (MISCELLANEOUS) ×2 IMPLANT
PACK EYE AFTER SURG (MISCELLANEOUS) ×2 IMPLANT
SOL BSS BAG (MISCELLANEOUS) ×2
SOLUTION BSS BAG (MISCELLANEOUS) ×1 IMPLANT
WATER STERILE IRR 250ML POUR (IV SOLUTION) ×2 IMPLANT
WIPE NON LINTING 3.25X3.25 (MISCELLANEOUS) ×2 IMPLANT

## 2016-09-07 NOTE — OR Nursing (Signed)
Patient is extremely anxious regarding wakefulness in surgery. She states that she has grabbed at the dentist when coming towards her face. She is requesting to be asleep for this.  Also, there is a cyst on her lower left lid near the tear ducts. Patient states that this has grown in the last few weeks and it does bother her driving. She is to have this removed at a later time.

## 2016-09-07 NOTE — Anesthesia Preprocedure Evaluation (Signed)
Anesthesia Evaluation  Patient identified by MRN, date of birth, ID band Patient awake    Reviewed: Allergy & Precautions, H&P , NPO status , Patient's Chart, lab work & pertinent test results, reviewed documented beta blocker date and time   History of Anesthesia Complications Negative for: history of anesthetic complications  Airway Mallampati: I  TM Distance: >3 FB Neck ROM: full    Dental  (+) Caps, Missing, Teeth Intact, Dental Advidsory Given Permanent bridge:   Pulmonary neg pulmonary ROS, former smoker,           Cardiovascular Exercise Tolerance: Good hypertension, (-) angina(-) CAD, (-) Past MI, (-) Cardiac Stents and (-) CABG (-) dysrhythmias (-) Valvular Problems/Murmurs     Neuro/Psych PSYCHIATRIC DISORDERS (Anxiety) negative neurological ROS     GI/Hepatic negative GI ROS, Neg liver ROS,   Endo/Other  diabetes  Renal/GU negative Renal ROS  negative genitourinary   Musculoskeletal   Abdominal   Peds  Hematology negative hematology ROS (+)   Anesthesia Other Findings Past Medical History: No date: Anxiety No date: Arthritis No date: Bronchitis No date: Diabetes mellitus without complication (HCC) No date: Hypercholesteremia No date: Hypertension No date: Lower extremity edema   Reproductive/Obstetrics negative OB ROS                             Anesthesia Physical Anesthesia Plan  ASA: III  Anesthesia Plan: MAC   Post-op Pain Management:    Induction: Intravenous  PONV Risk Score and Plan: Treatment may vary due to age or medical condition  Airway Management Planned: Natural Airway and Nasal Cannula  Additional Equipment:   Intra-op Plan:   Post-operative Plan:   Informed Consent: I have reviewed the patients History and Physical, chart, labs and discussed the procedure including the risks, benefits and alternatives for the proposed anesthesia with the  patient or authorized representative who has indicated his/her understanding and acceptance.   Dental Advisory Given  Plan Discussed with: Anesthesiologist, CRNA and Surgeon  Anesthesia Plan Comments:         Anesthesia Quick Evaluation

## 2016-09-07 NOTE — Anesthesia Post-op Follow-up Note (Signed)
Anesthesia QCDR form completed.        

## 2016-09-07 NOTE — Anesthesia Procedure Notes (Signed)
Procedure Name: MAC Date/Time: 09/07/2016 7:25 AM Performed by: Johnna Acosta Pre-anesthesia Checklist: Patient identified, Suction available, Emergency Drugs available, Patient being monitored and Timeout performed Patient Re-evaluated:Patient Re-evaluated prior to induction Oxygen Delivery Method: Nasal cannula

## 2016-09-07 NOTE — Op Note (Signed)
OPERATIVE NOTE  Nancy Chen [id number] 09/07/2016   PREOPERATIVE DIAGNOSIS:  Nuclear sclerotic cataract left eye.  H25.12   POSTOPERATIVE DIAGNOSIS:    Nuclear sclerotic cataract left eye.     PROCEDURE:  Phacoemusification with posterior chamber intraocular lens placement of the left eye   LENS:   Implant Name Type Inv. Item Serial No. Manufacturer Lot No. LRB No. Used  LENS IOL DIOP 20.0 - [id number] Intraocular Lens LENS IOL DIOP 20.0 [id number] AMO   Left 1       PCB00 +20.0   ULTRASOUND TIME: 0 minutes 41 seconds.  CDE 6.23   SURGEON:  Benay Pillow, MD, MPH   ANESTHESIA:  Topical with tetracaine drops augmented with 1% preservative-free intracameral lidocaine.  ESTIMATED BLOOD LOSS: <1 mL   COMPLICATIONS:  None.   DESCRIPTION OF PROCEDURE:  The patient was identified in the holding room and transported to the operating room and placed in the supine position under the operating microscope.  The left eye was identified as the operative eye and it was prepped and draped in the usual sterile ophthalmic fashion.   A 1.0 millimeter clear-corneal paracentesis was made at the 5:00 position. 0.5 ml of preservative-free 1% lidocaine with epinephrine was injected into the anterior chamber.  The anterior chamber was filled with Healon 5 viscoelastic.  A 2.4 millimeter keratome was used to make a near-clear corneal incision at the 2:00 position.  A curvilinear capsulorrhexis was made with a cystotome and capsulorrhexis forceps.  Balanced salt solution was used to hydrodissect and hydrodelineate the nucleus.   Phacoemulsification was then used in stop and chop fashion to remove the lens nucleus and epinucleus.  The remaining cortex was then removed using the irrigation and aspiration handpiece. Healon was then placed into the capsular bag to distend it for lens placement.  A lens was then injected into the capsular bag.  The remaining viscoelastic was aspirated.   Wounds were  hydrated with balanced salt solution.  The anterior chamber was inflated to a physiologic pressure with balanced salt solution.  Intracameral vigamox 0.1 mL undiltued was injected into the eye and a drop placed onto the ocular surface. No wound leaks were noted.  The patient was taken to the recovery room in stable condition without complications of anesthesia or surgery  Patient was very anxious at start of surgery, but remained calm and cooperative throughout the procedure.  Benay Pillow 09/07/2016, 7:56 AM

## 2016-09-07 NOTE — Anesthesia Postprocedure Evaluation (Signed)
Anesthesia Post Note  Patient: Nancy Chen  Procedure(s) Performed: Procedure(s) (LRB): CATARACT EXTRACTION PHACO AND INTRAOCULAR LENS PLACEMENT (IOC) (Left)  Patient location during evaluation: PACU Anesthesia Type: MAC Level of consciousness: awake and alert Pain management: pain level controlled Vital Signs Assessment: post-procedure vital signs reviewed and stable Respiratory status: spontaneous breathing, nonlabored ventilation, respiratory function stable and patient connected to nasal cannula oxygen Cardiovascular status: stable and blood pressure returned to baseline Anesthetic complications: no     Last Vitals:  Vitals:   09/07/16 0757 09/07/16 0813  BP: (!) 155/82 132/72  Pulse: 68 (!) 59  Resp: 16   Temp: (!) 35.9 C   SpO2: 99% 98%    Last Pain:  Vitals:   09/07/16 0757  TempSrc: Temporal                 Martha Clan

## 2016-09-07 NOTE — Transfer of Care (Signed)
Immediate Anesthesia Transfer of Care Note  Patient: Nancy Chen  Procedure(s) Performed: Procedure(s) with comments: CATARACT EXTRACTION PHACO AND INTRAOCULAR LENS PLACEMENT (IOC) (Left) - [place] 00:41.9 AP% 14.9 CDE 6.23 Fluid pack lot # [id number]  Patient Location: PACU  Anesthesia Type:MAC  Level of Consciousness: awake and alert   Airway & Oxygen Therapy: Patient Spontanous Breathing and Patient connected to nasal cannula oxygen  Post-op Assessment: Report given to RN and Post -op Vital signs reviewed and stable  Post vital signs: Reviewed and stable  Last Vitals:  Vitals:   09/07/16 0643 09/07/16 0757  BP: (!) 151/77 (!) 155/82  Pulse: 63 68  Resp: 16 16  Temp: (!) 36.1 C (!) 35.9 C  SpO2: 97% 99%    Last Pain:  Vitals:   09/07/16 0757  TempSrc: Temporal      Patients Stated Pain Goal: 0 (50/03/70 4888)  Complications: No apparent anesthesia complications

## 2016-09-07 NOTE — H&P (Signed)
The History and Physical notes are on paper, have been signed, and are to be scanned.   I have examined the patient and there are no changes to the H&P.   Benay Pillow 09/07/2016 7:17 AM

## 2016-09-14 DIAGNOSIS — D239 Other benign neoplasm of skin, unspecified: Secondary | ICD-10-CM | POA: Diagnosis not present

## 2016-09-14 DIAGNOSIS — C44119 Basal cell carcinoma of skin of left eyelid, including canthus: Secondary | ICD-10-CM | POA: Diagnosis not present

## 2016-09-27 ENCOUNTER — Other Ambulatory Visit: Payer: Self-pay | Admitting: Internal Medicine

## 2016-10-11 DIAGNOSIS — H2511 Age-related nuclear cataract, right eye: Secondary | ICD-10-CM | POA: Diagnosis not present

## 2016-10-12 ENCOUNTER — Encounter: Payer: Self-pay | Admitting: *Deleted

## 2016-10-12 ENCOUNTER — Ambulatory Visit (INDEPENDENT_AMBULATORY_CARE_PROVIDER_SITE_OTHER): Payer: Medicare Other

## 2016-10-12 DIAGNOSIS — Z23 Encounter for immunization: Secondary | ICD-10-CM | POA: Diagnosis not present

## 2016-10-19 ENCOUNTER — Encounter: Payer: Self-pay | Admitting: Ophthalmology

## 2016-10-19 ENCOUNTER — Encounter
Admission: RE | Disposition: A | Discharge status: Home / Self Care | Payer: Self-pay | Source: Ambulatory Visit | Attending: Ophthalmology

## 2016-10-19 ENCOUNTER — Ambulatory Visit: Payer: Medicare Other | Admitting: Anesthesiology

## 2016-10-19 ENCOUNTER — Ambulatory Visit
Admission: RE | Admit: 2016-10-19 | Discharge: 2016-10-19 | Disposition: A | Discharge status: Home / Self Care | Payer: Medicare Other | Source: Ambulatory Visit | Attending: Ophthalmology | Admitting: Ophthalmology

## 2016-10-19 DIAGNOSIS — E1136 Type 2 diabetes mellitus with diabetic cataract: Secondary | ICD-10-CM | POA: Diagnosis not present

## 2016-10-19 DIAGNOSIS — J449 Chronic obstructive pulmonary disease, unspecified: Secondary | ICD-10-CM | POA: Diagnosis not present

## 2016-10-19 DIAGNOSIS — I10 Essential (primary) hypertension: Secondary | ICD-10-CM | POA: Diagnosis not present

## 2016-10-19 DIAGNOSIS — F419 Anxiety disorder, unspecified: Secondary | ICD-10-CM | POA: Insufficient documentation

## 2016-10-19 DIAGNOSIS — H2511 Age-related nuclear cataract, right eye: Secondary | ICD-10-CM | POA: Diagnosis not present

## 2016-10-19 DIAGNOSIS — E119 Type 2 diabetes mellitus without complications: Secondary | ICD-10-CM | POA: Diagnosis not present

## 2016-10-19 DIAGNOSIS — Z79899 Other long term (current) drug therapy: Secondary | ICD-10-CM | POA: Diagnosis not present

## 2016-10-19 HISTORY — PX: CATARACT EXTRACTION W/PHACO: SHX586

## 2016-10-19 LAB — GLUCOSE, CAPILLARY: Glucose-Capillary: 111 mg/dL — ABNORMAL HIGH (ref 65–99)

## 2016-10-19 SURGERY — PHACOEMULSIFICATION, CATARACT, WITH IOL INSERTION
Anesthesia: Monitor Anesthesia Care | Laterality: Right

## 2016-10-19 MED ORDER — BSS IO SOLN
INTRAOCULAR | Status: DC | PRN
  Administered 2016-10-19: 1 via INTRAOCULAR

## 2016-10-19 MED ORDER — MOXIFLOXACIN HCL 0.5 % OP SOLN
OPHTHALMIC | Status: DC | PRN
  Administered 2016-10-19: 2 [drp] via OPHTHALMIC

## 2016-10-19 MED ORDER — FENTANYL CITRATE (PF) 100 MCG/2ML IJ SOLN
INTRAMUSCULAR | Status: DC | PRN
  Administered 2016-10-19: 50 ug via INTRAVENOUS

## 2016-10-19 MED ORDER — LIDOCAINE HCL (PF) 4 % IJ SOLN
INTRAMUSCULAR | Status: AC
  Filled 2016-10-19: qty 5

## 2016-10-19 MED ORDER — NA CHONDROIT SULF-NA HYALURON 40-17 MG/ML IO SOLN
INTRAOCULAR | Status: AC
  Filled 2016-10-19: qty 1

## 2016-10-19 MED ORDER — MOXIFLOXACIN HCL 0.5 % OP SOLN: 1.0000 [drp] | OPHTHALMIC | Status: DC | PRN

## 2016-10-19 MED ORDER — POVIDONE-IODINE 5 % OP SOLN
OPHTHALMIC | Status: DC | PRN
  Administered 2016-10-19: 1 via OPHTHALMIC

## 2016-10-19 MED ORDER — POVIDONE-IODINE 5 % OP SOLN
OPHTHALMIC | Status: AC
  Filled 2016-10-19: qty 30

## 2016-10-19 MED ORDER — NA CHONDROIT SULF-NA HYALURON 40-30 MG/ML IO SOLN
INTRAOCULAR | Status: DC | PRN
  Administered 2016-10-19: 0.75 mL via INTRAOCULAR

## 2016-10-19 MED ORDER — SODIUM CHLORIDE 0.9 % IV SOLN
INTRAVENOUS | Status: DC
  Administered 2016-10-19: 06:00:00 via INTRAVENOUS

## 2016-10-19 MED ORDER — MIDAZOLAM HCL 2 MG/2ML IJ SOLN
INTRAMUSCULAR | Status: AC
  Filled 2016-10-19: qty 2

## 2016-10-19 MED ORDER — MOXIFLOXACIN HCL 0.5 % OP SOLN
OPHTHALMIC | Status: AC
  Filled 2016-10-19: qty 3

## 2016-10-19 MED ORDER — LIDOCAINE HCL (PF) 4 % IJ SOLN
INTRAOCULAR | Status: DC | PRN
  Administered 2016-10-19: 1 mL via OPHTHALMIC

## 2016-10-19 MED ORDER — FENTANYL CITRATE (PF) 100 MCG/2ML IJ SOLN
INTRAMUSCULAR | Status: AC
  Filled 2016-10-19: qty 2

## 2016-10-19 MED ORDER — MIDAZOLAM HCL 2 MG/2ML IJ SOLN
INTRAMUSCULAR | Status: DC | PRN
  Administered 2016-10-19 (×2): 1 mg via INTRAVENOUS

## 2016-10-19 MED ORDER — ARMC OPHTHALMIC DILATING DROPS
1.0000 "application " | OPHTHALMIC | Status: AC
  Administered 2016-10-19 (×3): 1 via OPHTHALMIC

## 2016-10-19 MED ORDER — ARMC OPHTHALMIC DILATING DROPS
OPHTHALMIC | Status: AC
  Administered 2016-10-19: 1 via OPHTHALMIC
  Filled 2016-10-19: qty 0.4

## 2016-10-19 SURGICAL SUPPLY — 18 items
DISSECTOR HYDRO NUCLEUS 50X22 (MISCELLANEOUS) ×3 IMPLANT
GLOVE BIO SURGEON STRL SZ8 (GLOVE) ×3 IMPLANT
GLOVE BIOGEL M 6.5 STRL (GLOVE) ×3 IMPLANT
GLOVE SURG LX 7.5 STRW (GLOVE) ×2
GLOVE SURG LX STRL 7.5 STRW (GLOVE) ×1 IMPLANT
GOWN STRL REUS W/ TWL LRG LVL3 (GOWN DISPOSABLE) ×2 IMPLANT
GOWN STRL REUS W/TWL LRG LVL3 (GOWN DISPOSABLE) ×4
LABEL CATARACT MEDS ST (LABEL) ×3 IMPLANT
LENS IOL TECNIS ITEC 20.0 (Intraocular Lens) ×3 IMPLANT
PACK CATARACT (MISCELLANEOUS) ×3 IMPLANT
PACK CATARACT KING (MISCELLANEOUS) ×3 IMPLANT
PACK EYE AFTER SURG (MISCELLANEOUS) ×3 IMPLANT
SOL BAL SALT 15ML (MISCELLANEOUS) ×3
SOL BSS BAG (MISCELLANEOUS) ×3
SOLUTION BAL SALT 15ML (MISCELLANEOUS) ×1 IMPLANT
SOLUTION BSS BAG (MISCELLANEOUS) ×1 IMPLANT
WATER STERILE IRR 250ML POUR (IV SOLUTION) ×3 IMPLANT
WIPE NON LINTING 3.25X3.25 (MISCELLANEOUS) ×3 IMPLANT

## 2016-10-19 NOTE — Op Note (Signed)
OPERATIVE NOTE  Nancy Chen [id number] 10/19/2016   PREOPERATIVE DIAGNOSIS:  Nuclear sclerotic cataract right eye.  H25.11   POSTOPERATIVE DIAGNOSIS:    Nuclear sclerotic cataract right eye.     PROCEDURE:  Phacoemusification with posterior chamber intraocular lens placement of the right eye   LENS:   Implant Name Type Inv. Item Serial No. Manufacturer Lot No. LRB No. Used  LENS IOL DIOP 20.0 - [id number] Intraocular Lens LENS IOL DIOP 20.0 [id number] AMO   Right 1       PCB00 +20.0   ULTRASOUND TIME: 0 minutes 59 seconds.  CDE 9.79   SURGEON:  Benay Pillow, MD, MPH  ANESTHESIOLOGIST: Anesthesiologist: Gunnar Fusi, MD CRNA: Jonna Clark, CRNA   ANESTHESIA:  Topical with tetracaine drops augmented with 1% preservative-free intracameral lidocaine.  ESTIMATED BLOOD LOSS: less than 1 mL.   COMPLICATIONS:  None.   DESCRIPTION OF PROCEDURE:  The patient was identified in the holding room and transported to the operating room and placed in the supine position under the operating microscope.  The right eye was identified as the operative eye and it was prepped and draped in the usual sterile ophthalmic fashion.   A 1.0 millimeter clear-corneal paracentesis was made at the 10:30 position. 0.5 ml of preservative-free 1% lidocaine with epinephrine was injected into the anterior chamber.  The anterior chamber was filled with Discovisc viscoelastic.  A 2.4 millimeter keratome was used to make a near-clear corneal incision at the 8:00 position.  A curvilinear capsulorrhexis was made with a cystotome and capsulorrhexis forceps.  Balanced salt solution was used to hydrodissect and hydrodelineate the nucleus.   Phacoemulsification was then used in stop and chop fashion to remove the lens nucleus and epinucleus.  The remaining cortex was then removed using the irrigation and aspiration handpiece. Discovisc was then placed into the capsular bag to distend it for lens placement.   A lens was then injected into the capsular bag.  The remaining viscoelastic was aspirated.   Wounds were hydrated with balanced salt solution.  The anterior chamber was inflated to a physiologic pressure with balanced salt solution.   Intracameral vigamox 0.1 mL undiluted was injected into the eye and a drop placed onto the ocular surface.  No wound leaks were noted.  The patient was taken to the recovery room in stable condition without complications of anesthesia or surgery  Benay Pillow 10/19/2016, 8:19 AM

## 2016-10-19 NOTE — Anesthesia Preprocedure Evaluation (Addendum)
Anesthesia Evaluation  Patient identified by MRN, date of birth, ID band Patient awake    Reviewed: Allergy & Precautions, NPO status , Patient's Chart, lab work & pertinent test results  History of Anesthesia Complications Negative for: history of anesthetic complications  Airway Mallampati: III       Dental   Pulmonary COPD,  COPD inhaler,           Cardiovascular hypertension, Pt. on medications (-) Past MI and (-) CHF (-) dysrhythmias (-) Valvular Problems/Murmurs     Neuro/Psych neg Seizures Anxiety    GI/Hepatic neg GERD  ,  Endo/Other  diabetes, Type 2  Renal/GU      Musculoskeletal   Abdominal   Peds  Hematology   Anesthesia Other Findings   Reproductive/Obstetrics                            Anesthesia Physical Anesthesia Plan  ASA: III  Anesthesia Plan: MAC   Post-op Pain Management:    Induction:   PONV Risk Score and Plan:   Airway Management Planned:   Additional Equipment:   Intra-op Plan:   Post-operative Plan:   Informed Consent: I have reviewed the patients History and Physical, chart, labs and discussed the procedure including the risks, benefits and alternatives for the proposed anesthesia with the patient or authorized representative who has indicated his/her understanding and acceptance.     Plan Discussed with:   Anesthesia Plan Comments:         Anesthesia Quick Evaluation

## 2016-10-19 NOTE — H&P (Signed)
The History and Physical notes are on paper, have been signed, and are to be scanned.   I have examined the patient and there are no changes to the H&P.   Benay Pillow 10/19/2016 7:38 AM

## 2016-10-19 NOTE — Transfer of Care (Signed)
Immediate Anesthesia Transfer of Care Note  Patient: Nancy Chen  Procedure(s) Performed: CATARACT EXTRACTION PHACO AND INTRAOCULAR LENS PLACEMENT (IOC) (Right )  Patient Location: PACU and Short Stay  Anesthesia Type:MAC  Level of Consciousness: awake, alert  and oriented  Airway & Oxygen Therapy: Patient Spontanous Breathing  Post-op Assessment: Report given to RN and Post -op Vital signs reviewed and stable  Post vital signs: Reviewed  Last Vitals:  Vitals:   10/19/16 0614 10/19/16 0820  BP: (!) 154/82 (!) 126/97  Pulse: 70 71  Resp: 20 16  Temp: (!) 36.4 C   SpO2: 98% 97%    Last Pain:  Vitals:   10/19/16 0614  TempSrc: Tympanic         Complications: No apparent anesthesia complications

## 2016-10-19 NOTE — Anesthesia Post-op Follow-up Note (Signed)
Anesthesia QCDR form completed.        

## 2016-10-19 NOTE — Anesthesia Postprocedure Evaluation (Signed)
Anesthesia Post Note  Patient: Nancy Chen  Procedure(s) Performed: CATARACT EXTRACTION PHACO AND INTRAOCULAR LENS PLACEMENT (IOC) (Right )  Patient location during evaluation: Short Stay Anesthesia Type: MAC Level of consciousness: awake and alert and oriented Pain management: pain level controlled Vital Signs Assessment: post-procedure vital signs reviewed and stable Respiratory status: spontaneous breathing Cardiovascular status: stable Postop Assessment: adequate PO intake and no headache Anesthetic complications: no     Last Vitals:  Vitals:   10/19/16 0819 10/19/16 0820  BP: (!) 126/97 (!) 126/97  Pulse: 68 71  Resp: 16 16  Temp: (!) 36.1 C   SpO2: 100% 97%    Last Pain:  Vitals:   10/19/16 0819  TempSrc: [name]

## 2016-10-19 NOTE — Discharge Instructions (Signed)
Eye Surgery Discharge Instructions  Expect mild scratchy sensation or mild soreness. DO NOT RUB YOUR EYE!  The day of surgery:  Minimal physical activity, but bed rest is not required  No reading, computer work, or close hand work  No bending, lifting, or straining.  May watch TV  For 24 hours:  No driving, legal decisions, or alcoholic beverages  Safety precautions  Eat anything you prefer: It is better to start with liquids, then soup then solid foods.  _____ Eye patch should be worn until postoperative exam tomorrow.  ____ Solar shield eyeglasses should be worn for comfort in the sunlight/patch while sleeping  Resume all regular medications including aspirin or Coumadin if these were discontinued prior to surgery. You may shower, bathe, shave, or wash your hair. Tylenol may be taken for mild discomfort.  Call your doctor if you experience significant pain, nausea, or vomiting, fever > 101 or other signs of infection. [phone] or [phone] Specific instructions:  Follow-up Information    Eulogio Bear, MD Follow up.   Specialty:  Ophthalmology Why:  10/20/16 at 10:40 Contact information: [address] [place] [address] [phone]          Eye Surgery Discharge Instructions  Expect mild scratchy sensation or mild soreness. DO NOT RUB YOUR EYE!  The day of surgery:  Minimal physical activity, but bed rest is not required  No reading, computer work, or close hand work  No bending, lifting, or straining.  May watch TV  For 24 hours:  No driving, legal decisions, or alcoholic beverages  Safety precautions  Eat anything you prefer: It is better to start with liquids, then soup then solid foods.  _____ Eye patch should be worn until postoperative exam tomorrow.  ____ Solar shield eyeglasses should be worn for comfort in the sunlight/patch while sleeping  Resume all regular medications including aspirin or Coumadin if these were  discontinued prior to surgery. You may shower, bathe, shave, or wash your hair. Tylenol may be taken for mild discomfort.  Call your doctor if you experience significant pain, nausea, or vomiting, fever > 101 or other signs of infection. [phone] or [phone] Specific instructions:  Follow-up Information    Eulogio Bear, MD Follow up.   Specialty:  Ophthalmology Why:  10/20/16 at 10:40 Contact information: [address] [place] [address] [phone]

## 2016-10-31 ENCOUNTER — Encounter: Payer: Self-pay | Admitting: Internal Medicine

## 2016-10-31 ENCOUNTER — Ambulatory Visit (INDEPENDENT_AMBULATORY_CARE_PROVIDER_SITE_OTHER): Payer: Medicare Other | Admitting: Internal Medicine

## 2016-10-31 VITALS — BP 138/78 | HR 68 | Ht 63.0 in | Wt 226.0 lb

## 2016-10-31 DIAGNOSIS — E119 Type 2 diabetes mellitus without complications: Secondary | ICD-10-CM | POA: Diagnosis not present

## 2016-10-31 DIAGNOSIS — I10 Essential (primary) hypertension: Secondary | ICD-10-CM | POA: Diagnosis not present

## 2016-10-31 DIAGNOSIS — C44310 Basal cell carcinoma of skin of unspecified parts of face: Secondary | ICD-10-CM | POA: Insufficient documentation

## 2016-10-31 DIAGNOSIS — G43009 Migraine without aura, not intractable, without status migrainosus: Secondary | ICD-10-CM | POA: Insufficient documentation

## 2016-10-31 DIAGNOSIS — G43909 Migraine, unspecified, not intractable, without status migrainosus: Secondary | ICD-10-CM | POA: Insufficient documentation

## 2016-10-31 HISTORY — DX: Basal cell carcinoma of skin of unspecified parts of face: C44.310

## 2016-10-31 NOTE — Progress Notes (Signed)
Date:  10/31/2016   Name:  Nancy Chen   DOB:  1945-06-16   MRN:  [id number]   Chief Complaint: Diabetes (BS- this morning 133.) and Hypertension Diabetes  She presents for her follow-up diabetic visit. She has type 2 diabetes mellitus. Her disease course has been stable. Hypoglycemia symptoms include headaches. Pertinent negatives for hypoglycemia include no dizziness or tremors. Pertinent negatives for diabetes include no chest pain, no fatigue, no polydipsia and no polyuria. Symptoms are stable. Current diabetic treatment includes oral agent (dual therapy). She is compliant with treatment all of the time.  Hypertension  This is a chronic problem. The problem is controlled. Associated symptoms include headaches. Pertinent negatives include no chest pain, palpitations or shortness of breath. Past treatments include ACE inhibitors and diuretics.  Basal cell ca - found on her left lower eye lid just after her left cataract surgery in August.   Cataracts - s/p bilateral lens replacement August and October.  Doing well with excellent far vision.  Still using readers for up close.  Lab Results  Component Value Date   HGBA1C 5.8 (H) 06/27/2016     Review of Systems  Constitutional: Negative for appetite change, fatigue, fever and unexpected weight change.  HENT: Negative for tinnitus and trouble swallowing.   Eyes: Negative for visual disturbance.  Respiratory: Negative for cough, chest tightness and shortness of breath.   Cardiovascular: Negative for chest pain, palpitations and leg swelling.  Gastrointestinal: Negative for abdominal pain.  Endocrine: Negative for polydipsia and polyuria.  Genitourinary: Negative for dysuria and hematuria.  Musculoskeletal: Negative for arthralgias.  Neurological: Positive for headaches. Negative for dizziness, tremors and numbness.  Psychiatric/Behavioral: Negative for dysphoric mood.    Patient Active Problem List   Diagnosis Date Noted  .  Facial basal cell cancer 10/31/2016  . Migraine 10/31/2016  . Sleep disorder 02/04/2016  . Sleep disorder breathing 10/11/2015  . Lipoma of left upper extremity 10/11/2015  . Controlled type 2 diabetes mellitus without complication, without long-term current use of insulin ([place]) 02/11/2015  . Ovarian failure 11/10/2014  . Long-term use of high-risk medication 11/10/2014  . Essential (primary) hypertension 07/15/2014  . Calcium blood increased 07/15/2014  . Mixed hyperlipidemia 07/15/2014  . Dermatophytosis of groin 07/15/2014  . Tricompartment osteoarthritis of knee 07/15/2014    Prior to Admission medications   Medication Sig Start Date End Date Taking? Authorizing Provider  Acetaminophen (TYLENOL ARTHRITIS PAIN PO) Take 1 capsule by mouth 2 (two) times daily.   Yes [provider]  albuterol (PROVENTIL HFA;VENTOLIN HFA) 108 (90 Base) MCG/ACT inhaler Inhale 2 puffs into the lungs every 6 (six) hours as needed for wheezing or shortness of breath. 04/02/15  Yes Juline Patch, MD  aspirin (ECOTRIN LOW STRENGTH) 81 MG EC tablet Take 1 tablet by mouth daily.   Yes [provider]  cetirizine (ZYRTEC) 10 MG tablet Take 10 mg by mouth daily.   Yes [provider]  cyanocobalamin 1000 MCG tablet Take 1 tablet by mouth daily.   Yes [provider]  glucose blood (ACCU-CHEK AVIVA) test strip  01/29/13  Yes [provider]  lisinopril-hydrochlorothiazide (PRINZIDE,ZESTORETIC) 20-12.5 MG tablet TAKE 1 TABLET BY MOUTH DAILY. 09/27/16  Yes Glean Hess, MD  metFORMIN (GLUCOPHAGE) 1000 MG tablet TAKE 1 TABLET (1,000 MG TOTAL) BY MOUTH 2 (TWO) TIMES DAILY. 09/27/16  Yes Glean Hess, MD  Multiple Vitamins-Minerals (MULTIVITAMIN WITH MINERALS) tablet Take 1 tablet by mouth daily.   Yes [provider]  naproxen sodium (ANAPROX) 220 MG tablet Take 220 mg by mouth 2 (two) times daily with a meal.   Yes [provider]  Omega 3-6-9 Fatty  Acids (OMEGA 3-6-9 COMPLEX PO) Take 1,600 mg by mouth daily.   Yes [provider]  Pyridoxine HCl (VITAMIN B6) 200 MG TABS Take 1 tablet by mouth 2 (two) times daily.    Yes [provider]  simvastatin (ZOCOR) 20 MG tablet TAKE 1 TABLET (20 MG TOTAL) BY MOUTH DAILY. 09/27/16  Yes Glean Hess, MD  sitaGLIPtin (JANUVIA) 100 MG tablet Take 1 tablet by mouth daily.   Yes [provider]  Turmeric 500 MG CAPS Take by mouth.   Yes [provider]    Allergies  Allergen Reactions  . Glipizide Swelling    hives  . Codeine Hives    Past Surgical History:  Procedure Laterality Date  . ABDOMINAL HYSTERECTOMY    . CARPAL TUNNEL RELEASE Bilateral   . CARPAL TUNNEL RELEASE    . CATARACT EXTRACTION W/PHACO Left 09/07/2016   Procedure: CATARACT EXTRACTION PHACO AND INTRAOCULAR LENS PLACEMENT (IOC);  Surgeon: Eulogio Bear, MD;  Location: [facility] ORS;  Service: Ophthalmology;  Laterality: Left;  [place] 00:41.9 AP% 14.9 CDE 6.23 Fluid pack lot # [id number]  . CATARACT EXTRACTION W/PHACO Right 10/19/2016   Procedure: CATARACT EXTRACTION PHACO AND INTRAOCULAR LENS PLACEMENT ([place]);  Surgeon: Eulogio Bear, MD;  Location: [facility] ORS;  Service: Ophthalmology;  Laterality: Right;  casette lot # [id number] [place]  00:59.5 AP%   16.5 CDE   9.79   . COLONOSCOPY  06/2012   normal (in [place])  . TONSILLECTOMY      Social History  Substance Use Topics  . Smoking status: Never Smoker  . Smokeless tobacco: Never Used  . Alcohol use 1.2 oz/week    2 Standard drinks or equivalent per week     Comment: 1 glass of wine a month      Medication list has been reviewed and updated.  PHQ 2/9 Scores 06/26/2016 10/11/2015 06/11/2015 02/11/2015  PHQ - 2 Score 0 0 0 0    Physical Exam  Constitutional: She is oriented to person, place, and time. She appears well-developed. No distress.  HENT:  Head: Normocephalic and atraumatic.  Neck: Normal range of motion. Neck supple. Carotid  bruit is not present. No thyromegaly present.  Cardiovascular: Normal rate, regular rhythm and normal heart sounds.   Pulmonary/Chest: Effort normal and breath sounds normal. No respiratory distress. She has no wheezes.  Musculoskeletal: Normal range of motion. She exhibits no edema.  Neurological: She is alert and oriented to person, place, and time.  Skin: Skin is warm and dry. No rash noted.  Psychiatric: She has a normal mood and affect. Her behavior is normal. Thought content normal.  Nursing note and vitals reviewed.   BP 138/78   Pulse 68   Ht 5\' 3"  (1.6 m)   Wt 226 lb (102.5 kg)   SpO2 95%   BMI 40.03 kg/m   Assessment and Plan: 1. Essential (primary) hypertension controlled  2. Controlled type 2 diabetes mellitus without complication, without long-term current use of insulin ([place]) Doing well - will fax reading to supplier May be able to reduce or stop Januvia if A1C is < 6.0 - Hemoglobin A1c  3. Facial basal cell cancer Follow up with Dr. Phillip Heal  4. Migraine without aura and without status migrainosus, not intractable More frequent with recent weather events  No orders of the defined types were placed in this encounter.   Partially dictated using Editor, commissioning. Any errors are unintentional.  Halina Maidens, MD [facility]  10/31/2016

## 2016-11-01 LAB — HEMOGLOBIN A1C
ESTIMATED AVERAGE GLUCOSE: 126 mg/dL
HEMOGLOBIN A1C: 6 % — AB (ref 4.8–5.6)

## 2016-11-06 IMAGING — MG MM DIGITAL SCREENING BILAT W/ CAD
1 series · 4 of 4 positions shown · non-contrast
Comparison: Previous exam(s).

CLINICAL DATA: Screening.

EXAM:
DIGITAL SCREENING BILATERAL MAMMOGRAM WITH CAD

[R CC · right · 4 of 4 slices shown]
[im 1/4]
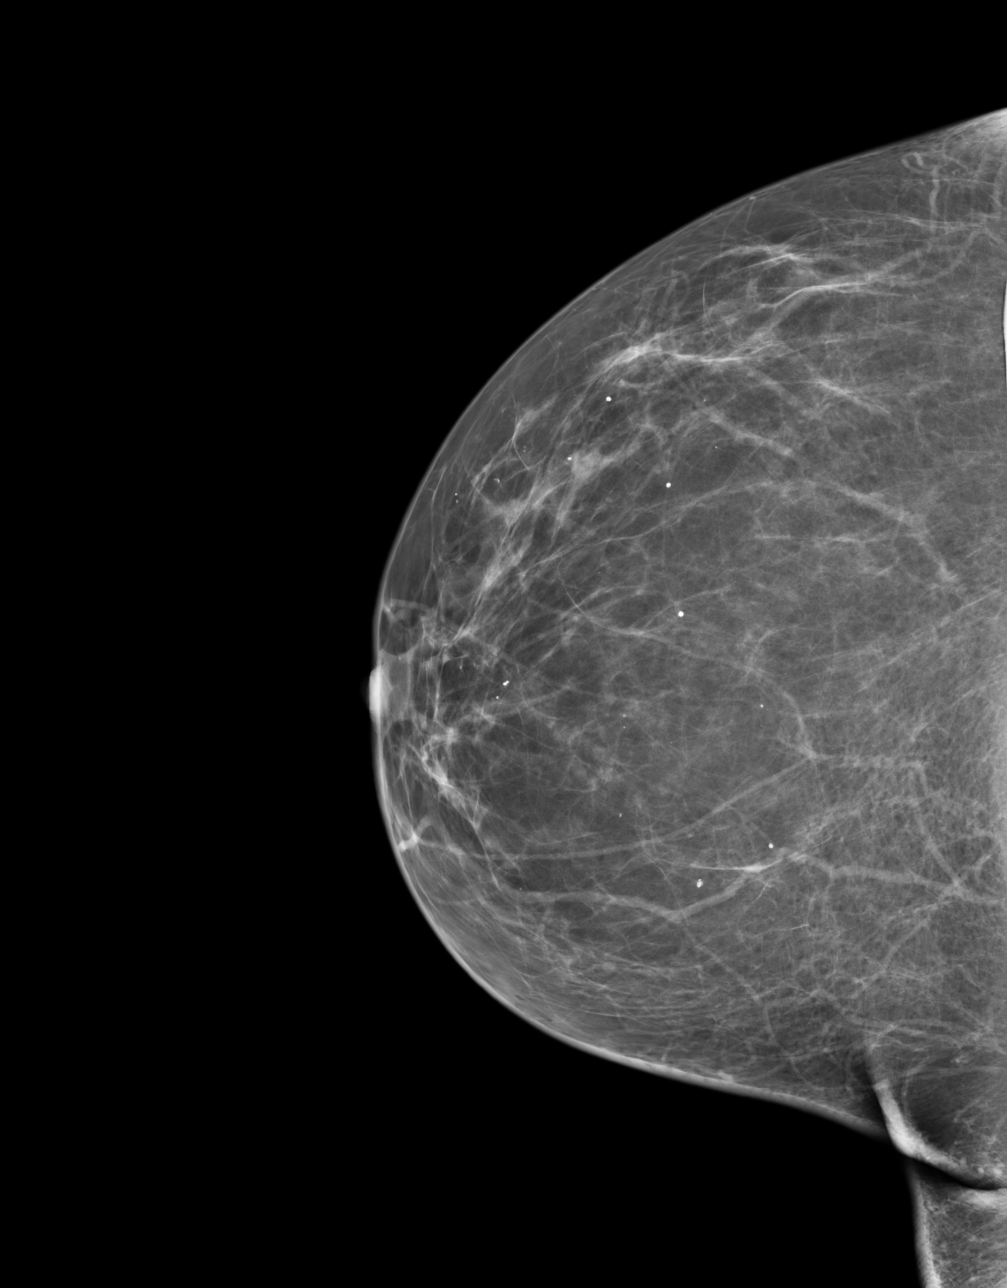
[im 2/4]
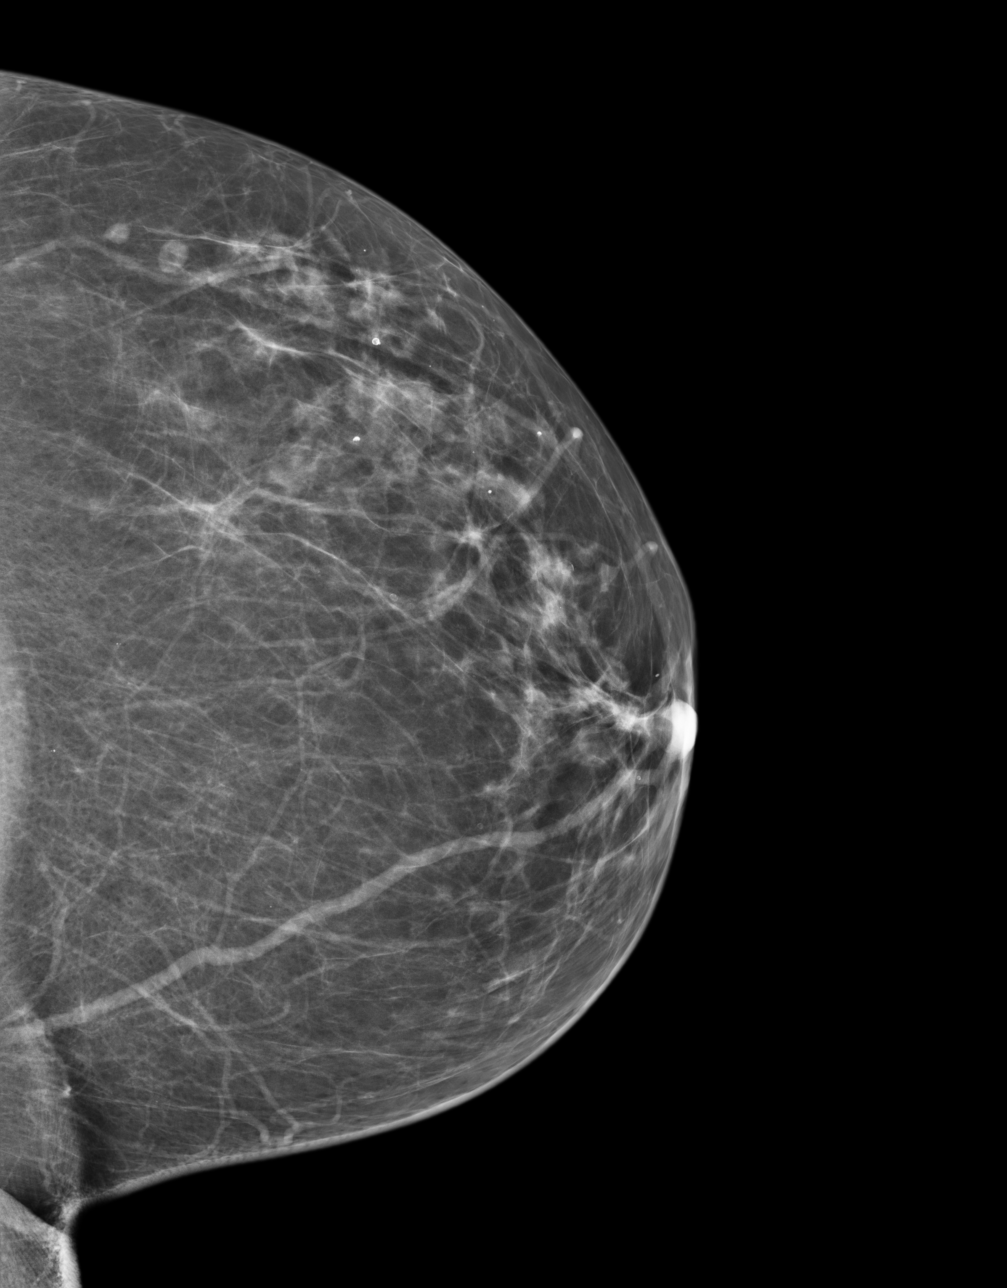
[im 3/4]
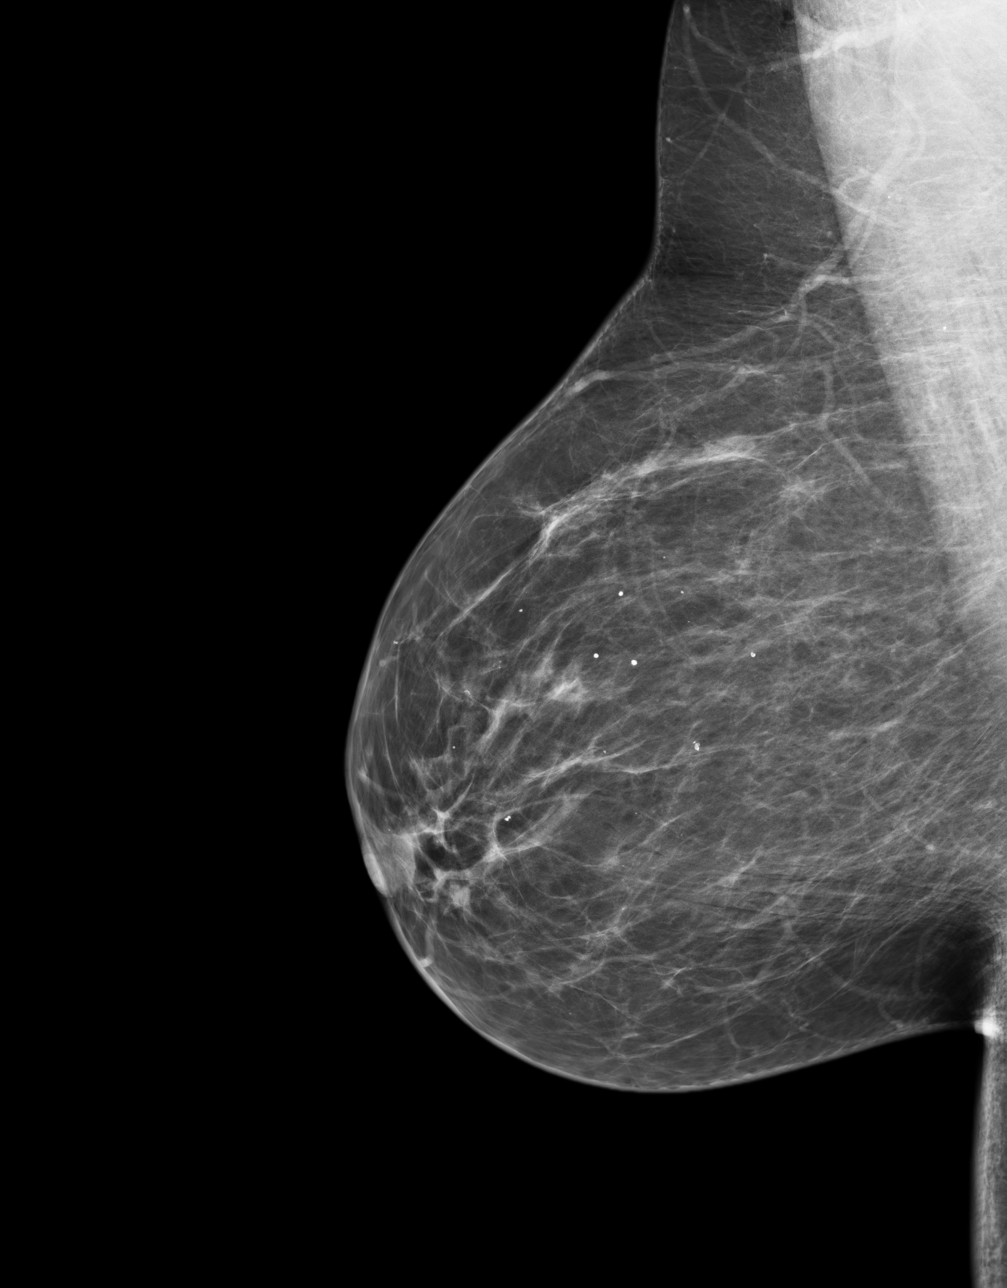
[im 4/4]
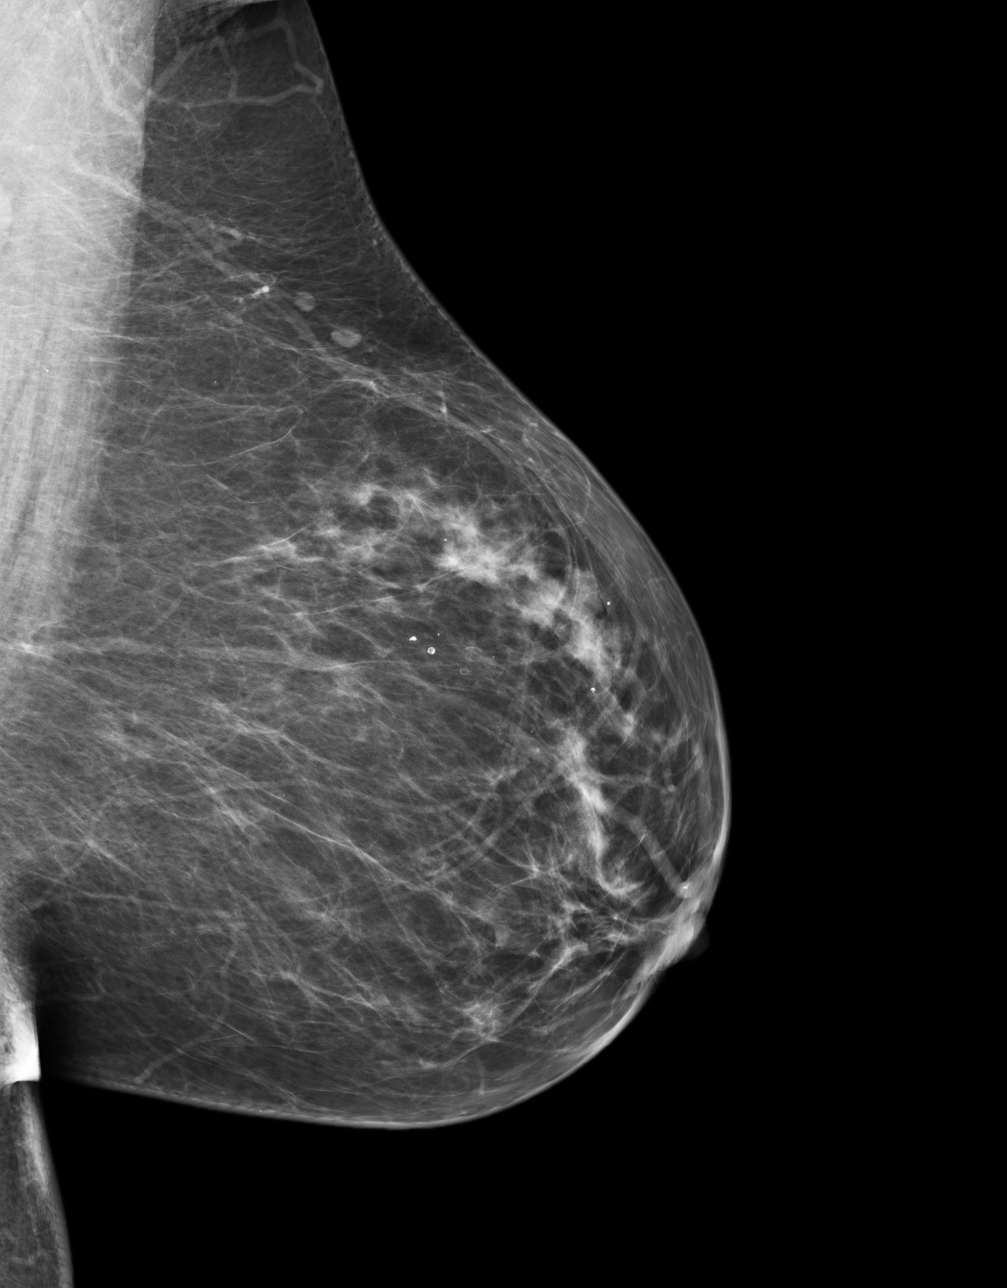

[4 of 4 positions shown; findings below may reference images not displayed]

ACR Breast Density Category b: There are scattered areas of
fibroglandular density.
FINDINGS: There are no findings suspicious for malignancy. Images were
processed with CAD.
IMPRESSION: No mammographic evidence of malignancy. A result letter of this
screening mammogram will be mailed directly to the patient.

RECOMMENDATION:
Screening mammogram in one year. (Code:AS-G-LCT)

BI-RADS CATEGORY  1: Negative.

## 2017-03-06 ENCOUNTER — Encounter: Payer: Self-pay | Admitting: Internal Medicine

## 2017-03-06 ENCOUNTER — Ambulatory Visit (INDEPENDENT_AMBULATORY_CARE_PROVIDER_SITE_OTHER): Payer: Medicare Other | Admitting: Internal Medicine

## 2017-03-06 VITALS — BP 124/80 | HR 92 | Ht 63.0 in | Wt 238.0 lb

## 2017-03-06 DIAGNOSIS — F5102 Adjustment insomnia: Secondary | ICD-10-CM

## 2017-03-06 DIAGNOSIS — E119 Type 2 diabetes mellitus without complications: Secondary | ICD-10-CM | POA: Diagnosis not present

## 2017-03-06 DIAGNOSIS — I10 Essential (primary) hypertension: Secondary | ICD-10-CM | POA: Diagnosis not present

## 2017-03-06 DIAGNOSIS — Z1231 Encounter for screening mammogram for malignant neoplasm of breast: Secondary | ICD-10-CM | POA: Diagnosis not present

## 2017-03-06 MED ORDER — TRAZODONE HCL 50 MG PO TABS: 25.0000 mg | ORAL_TABLET | Freq: Every evening | ORAL | 3 refills | Status: DC | PRN

## 2017-03-06 MED ORDER — SITAGLIPTIN PHOSPHATE 100 MG PO TABS: 100.0000 mg | ORAL_TABLET | Freq: Every day | ORAL | 3 refills | Status: DC

## 2017-03-06 NOTE — Progress Notes (Signed)
Date:  03/06/2017   Name:  Nancy Chen   DOB:  18-Apr-1945   MRN:  [id number]   Chief Complaint: Hypertension; Diabetes; and Depression (Lost two people close to her last year- PHQ9- 45. Not sleeping well for 4 months now. ) Hypertension  This is a chronic problem. The problem is unchanged. The problem is controlled. Pertinent negatives include no chest pain, palpitations or shortness of breath. Past treatments include ACE inhibitors and diuretics.  Insomnia  Primary symptoms: fragmented sleep, sleep disturbance.  The current episode started more than one month. The symptoms are aggravated by family issues.  She has tried tylenol PM.  She is hesitant to try prescription sleep meds but is fatigued and feels that she really needs to get some sleep.    Review of Systems  Constitutional: Negative for fever and unexpected weight change.  HENT: Negative for tinnitus and trouble swallowing.   Eyes: Negative for visual disturbance.  Respiratory: Negative for cough, chest tightness and shortness of breath.   Cardiovascular: Negative for chest pain, palpitations and leg swelling.  Gastrointestinal: Negative for abdominal pain.  Endocrine: Negative for polydipsia and polyuria.  Genitourinary: Negative for dysuria and hematuria.  Musculoskeletal: Negative for arthralgias.  Neurological: Negative for tremors and numbness.  Psychiatric/Behavioral: Positive for dysphoric mood and sleep disturbance.    Patient Active Problem List   Diagnosis Date Noted  . Facial basal cell cancer 10/31/2016  . Migraine 10/31/2016  . Sleep disorder 02/04/2016  . Lipoma of left upper extremity 10/11/2015  . Controlled type 2 diabetes mellitus without complication, without long-term current use of insulin ([place]) 02/11/2015  . Ovarian failure 11/10/2014  . Long-term use of high-risk medication 11/10/2014  . Essential (primary) hypertension 07/15/2014  . Calcium blood increased 07/15/2014  . Mixed  hyperlipidemia 07/15/2014  . Dermatophytosis of groin 07/15/2014  . Tricompartment osteoarthritis of knee 07/15/2014    Prior to Admission medications   Medication Sig Start Date End Date Taking? Authorizing Provider  Acetaminophen (TYLENOL ARTHRITIS PAIN PO) Take 1 capsule by mouth 2 (two) times daily.    [provider]  albuterol (PROVENTIL HFA;VENTOLIN HFA) 108 (90 Base) MCG/ACT inhaler Inhale 2 puffs into the lungs every 6 (six) hours as needed for wheezing or shortness of breath. 04/02/15   Juline Patch, MD  aspirin (ECOTRIN LOW STRENGTH) 81 MG EC tablet Take 1 tablet by mouth daily.    [provider]  cetirizine (ZYRTEC) 10 MG tablet Take 10 mg by mouth daily.    [provider]  cyanocobalamin 1000 MCG tablet Take 1 tablet by mouth daily.    [provider]  glucose blood (ACCU-CHEK AVIVA) test strip  01/29/13   [provider]  lisinopril-hydrochlorothiazide (PRINZIDE,ZESTORETIC) 20-12.5 MG tablet TAKE 1 TABLET BY MOUTH DAILY. 09/27/16   Glean Hess, MD  metFORMIN (GLUCOPHAGE) 1000 MG tablet TAKE 1 TABLET (1,000 MG TOTAL) BY MOUTH 2 (TWO) TIMES DAILY. 09/27/16   Glean Hess, MD  Multiple Vitamins-Minerals (MULTIVITAMIN WITH MINERALS) tablet Take 1 tablet by mouth daily.    [provider]  naproxen sodium (ANAPROX) 220 MG tablet Take 220 mg by mouth 2 (two) times daily with a meal.    [provider]  Omega 3-6-9 Fatty Acids (OMEGA 3-6-9 COMPLEX PO) Take 1,600 mg by mouth daily.    [provider]  Pyridoxine HCl (VITAMIN B6) 200 MG TABS Take 1 tablet by mouth 2 (two) times daily.     [provider]  simvastatin (ZOCOR) 20 MG tablet TAKE 1 TABLET (20 MG TOTAL) BY MOUTH DAILY. 09/27/16   Glean Hess, MD  sitaGLIPtin (JANUVIA) 100 MG tablet Take 1 tablet by mouth daily.    [provider]  Turmeric 500 MG CAPS Take by mouth.    [provider]    Allergies  Allergen  Reactions  . Glipizide Swelling    hives  . Codeine Hives    Past Surgical History:  Procedure Laterality Date  . ABDOMINAL HYSTERECTOMY    . CARPAL TUNNEL RELEASE Bilateral   . CARPAL TUNNEL RELEASE    . CATARACT EXTRACTION W/PHACO Left 09/07/2016   Procedure: CATARACT EXTRACTION PHACO AND INTRAOCULAR LENS PLACEMENT (IOC);  Surgeon: Eulogio Bear, MD;  Location: [facility] ORS;  Service: Ophthalmology;  Laterality: Left;  [place] 00:41.9 AP% 14.9 CDE 6.23 Fluid pack lot # [id number]  . CATARACT EXTRACTION W/PHACO Right 10/19/2016   Procedure: CATARACT EXTRACTION PHACO AND INTRAOCULAR LENS PLACEMENT ([place]);  Surgeon: Eulogio Bear, MD;  Location: [facility] ORS;  Service: Ophthalmology;  Laterality: Right;  casette lot # [id number] H [place]  00:59.5 AP%   16.5 CDE   9.79   . COLONOSCOPY  06/2012   normal (in [place])  . TONSILLECTOMY      Social History   Tobacco Use  . Smoking status: Never Smoker  . Smokeless tobacco: Never Used  Substance Use Topics  . Alcohol use: Yes    Alcohol/week: 1.2 oz    Types: 2 Standard drinks or equivalent per week    Comment: 1 glass of wine a month   . Drug use: No     Medication list has been reviewed and updated.  PHQ 2/9 Scores 03/06/2017 06/26/2016 10/11/2015 06/11/2015  PHQ - 2 Score 0 0 0 0  PHQ- 9 Score 8 - - -    Physical Exam  Constitutional: She is oriented to person, place, and time. She appears well-developed. No distress.  HENT:  Head: Normocephalic and atraumatic.  Neck: Normal range of motion. Neck supple. No thyromegaly present.  Cardiovascular: Normal rate, regular rhythm and normal heart sounds.  Pulmonary/Chest: Effort normal and breath sounds normal. No respiratory distress. She has no wheezes.  Musculoskeletal: Normal range of motion. She exhibits no edema.  Neurological: She is alert and oriented to person, place, and time. No sensory deficit.  Reflex Scores:      Patellar reflexes are 1+ on the right side and 1+ on the left  side. Skin: Skin is warm and dry. No rash noted.  Psychiatric: She has a normal mood and affect. Her behavior is normal. Thought content normal.  Nursing note and vitals reviewed.   BP 124/80   Pulse 92   Ht 5\' 3"  (1.6 m)   Wt 238 lb (108 kg)   SpO2 98%   BMI 42.16 kg/m   Assessment and Plan: 1. Controlled type 2 diabetes mellitus without complication, without long-term current use of insulin ([place]) Continue medication Continue working on diet and new exercise program - Hemoglobin A1c - Microalbumin / creatinine urine ratio - Comprehensive metabolic panel - sitaGLIPtin (JANUVIA) 100 MG tablet; Take 1 tablet (100 mg total) by mouth daily.  Dispense: 90 tablet; Refill: 3  2. Essential (primary) hypertension controlled  3. Adjustment insomnia Begin trazodone PRN - traZODone (DESYREL) 50 MG tablet; Take 0.5 tablets (25 mg total) by mouth at bedtime as needed for sleep.  Dispense: 30 tablet; Refill: 3  4. Encounter for screening mammogram for breast  cancer - MM DIGITAL SCREENING BILATERAL; Future   Meds ordered this encounter  Medications  . traZODone (DESYREL) 50 MG tablet    Sig: Take 0.5 tablets (25 mg total) by mouth at bedtime as needed for sleep.    Dispense:  30 tablet    Refill:  3  . sitaGLIPtin (JANUVIA) 100 MG tablet    Sig: Take 1 tablet (100 mg total) by mouth daily.    Dispense:  90 tablet    Refill:  3    Partially dictated using Editor, commissioning. Any errors are unintentional.  Halina Maidens, MD [facility]  03/06/2017

## 2017-03-07 LAB — COMPREHENSIVE METABOLIC PANEL
A/G RATIO: 2.1 (ref 1.2–2.2)
ALK PHOS: 57 IU/L (ref 39–117)
ALT: 26 IU/L (ref 0–32)
AST: 23 IU/L (ref 0–40)
Albumin: 4.8 g/dL (ref 3.6–4.8)
BUN/Creatinine Ratio: 22 (ref 12–28)
BUN: 17 mg/dL (ref 8–27)
Bilirubin Total: 0.3 mg/dL (ref 0.0–1.2)
CO2: 23 mmol/L (ref 20–29)
Calcium: 10.1 mg/dL (ref 8.7–10.3)
Chloride: 102 mmol/L (ref 96–106)
Creatinine, Ser: 0.76 mg/dL (ref 0.57–1.00)
GFR calc Af Amer: 93 mL/min/{1.73_m2} (ref >59)
GFR calc non Af Amer: 80 mL/min/{1.73_m2} (ref >59)
GLOBULIN, TOTAL: 2.3 g/dL (ref 1.5–4.5)
Glucose: 136 mg/dL — ABNORMAL HIGH (ref 65–99)
POTASSIUM: 5 mmol/L (ref 3.5–5.2)
SODIUM: 143 mmol/L (ref 134–144)
Total Protein: 7.1 g/dL (ref 6.0–8.5)

## 2017-03-07 LAB — HEMOGLOBIN A1C
Est. average glucose Bld gHb Est-mCnc: 146 mg/dL
HEMOGLOBIN A1C: 6.7 % — AB (ref 4.8–5.6)

## 2017-03-19 ENCOUNTER — Encounter: Payer: Self-pay | Admitting: Family Medicine

## 2017-03-19 ENCOUNTER — Ambulatory Visit (INDEPENDENT_AMBULATORY_CARE_PROVIDER_SITE_OTHER): Payer: Medicare Other | Admitting: Family Medicine

## 2017-03-19 VITALS — BP 128/84 | HR 78 | Temp 98.7°F | Resp 16 | Ht 63.0 in | Wt 232.0 lb

## 2017-03-19 DIAGNOSIS — E119 Type 2 diabetes mellitus without complications: Secondary | ICD-10-CM

## 2017-03-19 DIAGNOSIS — J42 Unspecified chronic bronchitis: Secondary | ICD-10-CM

## 2017-03-19 DIAGNOSIS — J209 Acute bronchitis, unspecified: Secondary | ICD-10-CM | POA: Diagnosis not present

## 2017-03-19 DIAGNOSIS — E782 Mixed hyperlipidemia: Secondary | ICD-10-CM | POA: Diagnosis not present

## 2017-03-19 DIAGNOSIS — I10 Essential (primary) hypertension: Secondary | ICD-10-CM

## 2017-03-19 MED ORDER — ALBUTEROL SULFATE (2.5 MG/3ML) 0.083% IN NEBU: 2.5000 mg | INHALATION_SOLUTION | Freq: Four times a day (QID) | RESPIRATORY_TRACT | 2 refills | Status: DC | PRN

## 2017-03-19 MED ORDER — DOXYCYCLINE HYCLATE 100 MG PO CAPS: 100.0000 mg | ORAL_CAPSULE | Freq: Two times a day (BID) | ORAL | 0 refills | Status: DC

## 2017-03-19 MED ORDER — BUDESONIDE-FORMOTEROL FUMARATE 160-4.5 MCG/ACT IN AERO
2.0000 | INHALATION_SPRAY | Freq: Two times a day (BID) | RESPIRATORY_TRACT | 3 refills | Status: DC
Start: 2017-03-19 — End: 2018-07-08

## 2017-03-19 MED ORDER — ALBUTEROL SULFATE HFA 108 (90 BASE) MCG/ACT IN AERS: 2.0000 | INHALATION_SPRAY | Freq: Four times a day (QID) | RESPIRATORY_TRACT | 2 refills | Status: DC | PRN

## 2017-03-19 NOTE — Progress Notes (Signed)
Date:  03/19/2017   Name:  Nancy Chen   DOB:  09/08/1945   MRN:  [id number]  PCP:  Glean Hess, MD    Chief Complaint: Cough (Did take and use Nebulizer and inhaler but they were expired as well as expired tesslon pearls....wheezing and chest congestion started about 5-6 days ago )   History of Present Illness:  This is a 72 y.o. female seen for same day visit. C/o 1 week hx cough/wheezing, hx recurrent bronchitis, nebs helping some. Some night sweats. HTN on Prinzide, T2DM on metformin/Januvia, a1c 6.7% last month.  Review of Systems:  Review of Systems  Constitutional: Negative for chills and fever.  HENT: Negative for rhinorrhea and sore throat.   Cardiovascular: Negative for chest pain and leg swelling.  Genitourinary: Negative for difficulty urinating.  Neurological: Negative for syncope and light-headedness.    Patient Active Problem List   Diagnosis Date Noted  . Facial basal cell cancer 10/31/2016  . Migraine 10/31/2016  . Sleep disorder 02/04/2016  . Lipoma of left upper extremity 10/11/2015  . Controlled type 2 diabetes mellitus without complication, without long-term current use of insulin ([place]) 02/11/2015  . Ovarian failure 11/10/2014  . Long-term use of high-risk medication 11/10/2014  . Essential (primary) hypertension 07/15/2014  . Mixed hyperlipidemia 07/15/2014  . Dermatophytosis of groin 07/15/2014  . Tricompartment osteoarthritis of knee 07/15/2014    Prior to Admission medications   Medication Sig Start Date End Date Taking? Authorizing Provider  Acetaminophen (TYLENOL ARTHRITIS PAIN PO) Take 1 capsule by mouth 2 (two) times daily.   Yes [provider]  albuterol (PROVENTIL HFA;VENTOLIN HFA) 108 (90 Chen) MCG/ACT inhaler Inhale 2 puffs into the lungs every 6 (six) hours as needed for wheezing or shortness of breath. 04/02/15  Yes Juline Patch, MD  cetirizine (ZYRTEC) 10 MG tablet Take 10 mg by mouth daily.   Yes [provider]  cyanocobalamin 1000 MCG tablet Take 1 tablet by mouth daily.   Yes [provider]  glucose blood (ACCU-CHEK AVIVA) test strip  01/29/13  Yes [provider]  lisinopril-hydrochlorothiazide (PRINZIDE,ZESTORETIC) 20-12.5 MG tablet TAKE 1 TABLET BY MOUTH DAILY. 09/27/16  Yes Glean Hess, MD  metFORMIN (GLUCOPHAGE) 1000 MG tablet TAKE 1 TABLET (1,000 MG TOTAL) BY MOUTH 2 (TWO) TIMES DAILY. 09/27/16  Yes Glean Hess, MD  Multiple Vitamins-Minerals (MULTIVITAMIN WITH MINERALS) tablet Take 1 tablet by mouth daily.   Yes [provider]  naproxen sodium (ANAPROX) 220 MG tablet Take 220 mg by mouth 2 (two) times daily with a meal.   Yes [provider]  Omega 3-6-9 Fatty Acids (OMEGA 3-6-9 COMPLEX PO) Take 1,600 mg by mouth daily.   Yes [provider]  Pyridoxine HCl (VITAMIN B6) 200 MG TABS Take 1 tablet by mouth 2 (two) times daily.    Yes [provider]  simvastatin (ZOCOR) 20 MG tablet TAKE 1 TABLET (20 MG TOTAL) BY MOUTH DAILY. 09/27/16  Yes Glean Hess, MD  sitaGLIPtin (JANUVIA) 100 MG tablet Take 1 tablet (100 mg total) by mouth daily. 03/06/17  Yes Glean Hess, MD  traZODone (DESYREL) 50 MG tablet Take 0.5 tablets (25 mg total) by mouth at bedtime as needed for sleep. 03/06/17  Yes Glean Hess, MD  doxycycline (VIBRAMYCIN) 100 MG capsule Take 1 capsule (100 mg total) by mouth 2 (two) times daily. 03/19/17   Adline Potter, MD    Allergies  Allergen Reactions  . Glipizide Swelling  hives  . Codeine Hives    Past Surgical History:  Procedure Laterality Date  . ABDOMINAL HYSTERECTOMY    . CARPAL TUNNEL RELEASE Bilateral   . CARPAL TUNNEL RELEASE    . CATARACT EXTRACTION W/PHACO Left 09/07/2016   Procedure: CATARACT EXTRACTION PHACO AND INTRAOCULAR LENS PLACEMENT (IOC);  Surgeon: Eulogio Bear, MD;  Location: [facility] ORS;  Service: Ophthalmology;  Laterality: Left;  [place] 00:41.9 AP% 14.9 CDE 6.23 Fluid pack  lot # [id number]  . CATARACT EXTRACTION W/PHACO Right 10/19/2016   Procedure: CATARACT EXTRACTION PHACO AND INTRAOCULAR LENS PLACEMENT ([place]);  Surgeon: Eulogio Bear, MD;  Location: [facility] ORS;  Service: Ophthalmology;  Laterality: Right;  casette lot # [id number] H [place]  00:59.5 AP%   16.5 CDE   9.79   . COLONOSCOPY  06/2012   normal (in [place])  . TONSILLECTOMY      Social History   Tobacco Use  . Smoking status: Never Smoker  . Smokeless tobacco: Never Used  Substance Use Topics  . Alcohol use: Yes    Alcohol/week: 1.2 oz    Types: 2 Standard drinks or equivalent per week    Comment: 1 glass of wine a month   . Drug use: No    Family History  Problem Relation Age of Onset  . CAD Father   . Breast cancer Maternal Grandmother        great gm    Medication list has been reviewed and updated.  Physical Examination: BP 128/84   Pulse 78   Temp 98.7 F (37.1 C) (Oral)   Resp 16   Ht 5\' 3"  (1.6 m)   Wt 232 lb (105.2 kg)   SpO2 93%   BMI 41.10 kg/m   Physical Exam  Constitutional: She appears well-developed and well-nourished. No distress.  Cardiovascular: Normal rate, regular rhythm and normal heart sounds.  Pulmonary/Chest: Effort normal and breath sounds normal.  Musculoskeletal: She exhibits no edema.  Neurological: She is alert.  Skin: Skin is warm and dry. She is not diaphoretic.  Psychiatric: She has a normal mood and affect. Her behavior is normal.  Nursing note and vitals reviewed.   Assessment and Plan:  1. Acute exacerbation of chronic bronchitis (HCC) Doxy 100 mg bid x 5d, refill Symbicort, albuterol nebs, albuterol MDI  2. Controlled type 2 diabetes mellitus without complication, without long-term current use of insulin ([place]) Well controlled on metformin/Januvia, needs MCR/ophtho exam next visit  3. Essential (primary) hypertension Well controlled on Prinzide  4. Mixed hyperlipidemia Well controlled on Zocor  5. Med review D/c asa as no clear  indication for use, consider changing Naprosyn to scheduled Tylenol, d/c fish oil next visit  Return if symptoms worsen or fail to improve.  Satira Anis. [facility]  03/19/2017

## 2017-03-22 ENCOUNTER — Other Ambulatory Visit: Payer: Self-pay

## 2017-03-22 MED ORDER — SIMVASTATIN 20 MG PO TABS: 20.0000 mg | ORAL_TABLET | Freq: Every day | ORAL | 1 refills | Status: DC

## 2017-03-22 MED ORDER — METFORMIN HCL 1000 MG PO TABS: 1000.0000 mg | ORAL_TABLET | Freq: Two times a day (BID) | ORAL | 1 refills | Status: DC

## 2017-03-22 MED ORDER — LISINOPRIL-HYDROCHLOROTHIAZIDE 20-12.5 MG PO TABS: 1.0000 | ORAL_TABLET | Freq: Every day | ORAL | 1 refills | Status: DC

## 2017-03-27 ENCOUNTER — Ambulatory Visit
Admission: RE | Admit: 2017-03-27 | Discharge: 2017-03-27 | Disposition: A | Discharge status: Home / Self Care | Payer: Medicare Other | Source: Ambulatory Visit | Attending: Internal Medicine | Admitting: Internal Medicine

## 2017-03-27 DIAGNOSIS — Z1231 Encounter for screening mammogram for malignant neoplasm of breast: Secondary | ICD-10-CM | POA: Diagnosis not present

## 2017-03-28 ENCOUNTER — Other Ambulatory Visit: Payer: Self-pay | Admitting: Internal Medicine

## 2017-03-28 DIAGNOSIS — F5102 Adjustment insomnia: Secondary | ICD-10-CM

## 2017-03-28 MED ORDER — TRAZODONE HCL 50 MG PO TABS: 25.0000 mg | ORAL_TABLET | Freq: Every evening | ORAL | 3 refills | Status: DC | PRN

## 2017-04-16 ENCOUNTER — Telehealth: Payer: Self-pay

## 2017-04-16 NOTE — Telephone Encounter (Signed)
07/05/17 AWV needing rescheduled. Program no longer available on Thurs. Last AWV 06/26/16. LVm requesting returned call.

## 2017-06-25 DIAGNOSIS — L578 Other skin changes due to chronic exposure to nonionizing radiation: Secondary | ICD-10-CM | POA: Diagnosis not present

## 2017-06-25 DIAGNOSIS — L570 Actinic keratosis: Secondary | ICD-10-CM | POA: Diagnosis not present

## 2017-06-25 DIAGNOSIS — C44529 Squamous cell carcinoma of skin of other part of trunk: Secondary | ICD-10-CM | POA: Diagnosis not present

## 2017-06-25 DIAGNOSIS — Z86018 Personal history of other benign neoplasm: Secondary | ICD-10-CM | POA: Diagnosis not present

## 2017-06-25 DIAGNOSIS — Z872 Personal history of diseases of the skin and subcutaneous tissue: Secondary | ICD-10-CM | POA: Diagnosis not present

## 2017-06-25 DIAGNOSIS — D485 Neoplasm of uncertain behavior of skin: Secondary | ICD-10-CM | POA: Diagnosis not present

## 2017-06-25 DIAGNOSIS — C44612 Basal cell carcinoma of skin of right upper limb, including shoulder: Secondary | ICD-10-CM | POA: Diagnosis not present

## 2017-07-02 ENCOUNTER — Ambulatory Visit (INDEPENDENT_AMBULATORY_CARE_PROVIDER_SITE_OTHER): Payer: Medicare Other

## 2017-07-02 VITALS — BP 110/60 | HR 90 | Temp 98.4°F | Resp 12 | Ht 63.0 in | Wt 241.2 lb

## 2017-07-02 DIAGNOSIS — E2839 Other primary ovarian failure: Secondary | ICD-10-CM

## 2017-07-02 DIAGNOSIS — Z0000 Encounter for general adult medical examination without abnormal findings: Secondary | ICD-10-CM | POA: Diagnosis not present

## 2017-07-02 DIAGNOSIS — M810 Age-related osteoporosis without current pathological fracture: Secondary | ICD-10-CM | POA: Diagnosis not present

## 2017-07-02 NOTE — Progress Notes (Signed)
Subjective:   LENAY LOVEJOY is a 72 y.o. female who presents for Medicare Annual (Subsequent) preventive examination.  Review of Systems:   N/A Cardiac Risk Factors include: advanced age (>62men, >73 women);diabetes mellitus;dyslipidemia;hypertension;obesity (BMI >30kg/m2)     Objective:     Vitals: BP 110/60 (BP Location: Right Arm, Patient Position: Sitting, Cuff Size: Large)   Pulse 90   Temp 98.4 F (36.9 C) (Oral)   Resp 12   Ht 5\' 3"  (1.6 m)   Wt 241 lb 3.2 oz (109.4 kg)   SpO2 94%   BMI 42.73 kg/m   Body mass index is 42.73 kg/m.  Advanced Directives 07/02/2017 09/07/2016 06/26/2016 03/26/2015 02/11/2015  Does Patient Have a Medical Advance Directive? Yes Yes Yes Yes Yes  Type of Paramedic of [place];Living will Living will;Healthcare Power of [place];Living will [place];Living will [place];Living will  Does patient want to make changes to medical advance directive? - No - Patient declined - - -  Copy of [place] in Chart? Yes No - copy requested Yes - -    Tobacco Social History   Tobacco Use  Smoking Status Never Smoker  Smokeless Tobacco Never Used  Tobacco Comment   smoking cessation materials not required     Counseling given: No Comment: smoking cessation materials not required  Clinical Intake:  Pre-visit preparation completed: Yes  Pain : No/denies pain   BMI - recorded: 42.73 Nutritional Status: BMI > 30  Obese Nutritional Risks: None  Nutrition Risk Assessment: Has the patient had any N/V/D within the last 2 months?  No Does the patient have any non-healing wounds?  No Has the patient had any unintentional weight loss or weight gain?  No  Is the patient diabetic?  Yes If diabetic, was a CBG obtained today?  No Did the patient bring in their glucometer from home?  No Comments: Pt monitors CBG's at least once per week. Denies  any financial strains with the device or supplies.  Diabetic Exams: Diabetic Eye Exam: Completed 03/15/16. Scheduled for updated diabetic eye exam on 07/06/17. Diabetic Foot Exam: Completed 03/06/17.  How often do you need to have someone help you when you read instructions, pamphlets, or other written materials from your doctor or pharmacy?: 1 - Never  Interpreter Needed?: No  Information entered by :: Idell Pickles, LPN  Past Medical History:  Diagnosis Date  . Anxiety   . Arthritis   . Bronchitis   . Diabetes mellitus without complication ([place])   . Facial basal cell cancer 10/31/2016   also left lower eyelid  . Hypercholesteremia   . Hypertension   . Lower extremity edema    Past Surgical History:  Procedure Laterality Date  . ABDOMINAL HYSTERECTOMY    . BASAL CELL CARCINOMA EXCISION    . CARPAL TUNNEL RELEASE Bilateral   . CARPAL TUNNEL RELEASE    . CATARACT EXTRACTION W/PHACO Left 09/07/2016   Procedure: CATARACT EXTRACTION PHACO AND INTRAOCULAR LENS PLACEMENT (IOC);  Surgeon: Eulogio Bear, MD;  Location: [facility] ORS;  Service: Ophthalmology;  Laterality: Left;  [place] 00:41.9 AP% 14.9 CDE 6.23 Fluid pack lot # [id number]  . CATARACT EXTRACTION W/PHACO Right 10/19/2016   Procedure: CATARACT EXTRACTION PHACO AND INTRAOCULAR LENS PLACEMENT ([place]);  Surgeon: Eulogio Bear, MD;  Location: [facility] ORS;  Service: Ophthalmology;  Laterality: Right;  casette lot # [id number] H [place]  00:59.5 AP%   16.5 CDE  9.79   . COLONOSCOPY  06/2012   normal (in [place])  . TONSILLECTOMY     Family History  Problem Relation Age of Onset  . Alzheimer's disease Mother   . CAD Father   . Kidney disease Father   . Breast cancer Maternal Grandmother        great gm   Social History   Socioeconomic History  . Marital status: Widowed    Spouse name: Not on file  . Number of children: 1  . Years of education: Not on file  . Highest education level: 12th grade  Occupational History  . Occupation:  Retired  Scientific laboratory technician  . Financial resource strain: Not hard at all  . Food insecurity:    Worry: Never true    Inability: Never true  . Transportation needs:    Medical: No    Non-medical: No  Tobacco Use  . Smoking status: Never Smoker  . Smokeless tobacco: Never Used  . Tobacco comment: smoking cessation materials not required  Substance and Sexual Activity  . Alcohol use: Not Currently  . Drug use: No  . Sexual activity: Never  Lifestyle  . Physical activity:    Days per week: 3 days    Minutes per session: 60 min  . Stress: Not at all  Relationships  . Social connections:    Talks on phone: Patient refused    Gets together: Patient refused    Attends religious service: Patient refused    Active member of club or organization: Patient refused    Attends meetings of clubs or organizations: Patient refused    Relationship status: Widowed  Other Topics Concern  . Not on file  Social History Narrative  . Not on file    Outpatient Encounter Medications as of 07/02/2017  Medication Sig  . Acetaminophen (TYLENOL ARTHRITIS PAIN PO) Take 1 capsule by mouth 2 (two) times daily.  Marland Kitchen albuterol (PROVENTIL HFA;VENTOLIN HFA) 108 (90 Base) MCG/ACT inhaler Inhale 2 puffs into the lungs every 6 (six) hours as needed for wheezing or shortness of breath.  Marland Kitchen albuterol (PROVENTIL) (2.5 MG/3ML) 0.083% nebulizer solution Take 3 mLs (2.5 mg total) by nebulization every 6 (six) hours as needed for wheezing or shortness of breath.  . budesonide-formoterol (SYMBICORT) 160-4.5 MCG/ACT inhaler Inhale 2 puffs into the lungs 2 (two) times daily. (Patient taking differently: Inhale 2 puffs into the lungs 2 (two) times daily as needed. )  . cetirizine (ZYRTEC) 10 MG tablet Take 10 mg by mouth as needed.   . cyanocobalamin 1000 MCG tablet Take 1 tablet by mouth daily.  Marland Kitchen glucose blood (ACCU-CHEK AVIVA) test strip   . lisinopril-hydrochlorothiazide (PRINZIDE,ZESTORETIC) 20-12.5 MG tablet Take 1 tablet by  mouth daily.  . metFORMIN (GLUCOPHAGE) 1000 MG tablet Take 1 tablet (1,000 mg total) by mouth 2 (two) times daily.  . Multiple Vitamins-Minerals (MULTIVITAMIN WITH MINERALS) tablet Take 1 tablet by mouth daily.  . Omega 3-6-9 Fatty Acids (OMEGA 3-6-9 COMPLEX PO) Take 1,600 mg by mouth daily.  . Pyridoxine HCl (VITAMIN B6) 200 MG TABS Take 1 tablet by mouth 2 (two) times daily.   . simvastatin (ZOCOR) 20 MG tablet Take 1 tablet (20 mg total) by mouth daily.  . sitaGLIPtin (JANUVIA) 100 MG tablet Take 1 tablet (100 mg total) by mouth daily.  . traZODone (DESYREL) 50 MG tablet Take 0.5 tablets (25 mg total) by mouth at bedtime as needed for sleep.  Marland Kitchen doxycycline (VIBRAMYCIN) 100 MG capsule Take 1 capsule (100  mg total) by mouth 2 (two) times daily.  . naproxen sodium (ANAPROX) 220 MG tablet Take 220 mg by mouth 2 (two) times daily with a meal.   Facility-Administered Encounter Medications as of 07/02/2017  Medication  . albuterol (PROVENTIL) (2.5 MG/3ML) 0.083% nebulizer solution 2.5 mg    Activities of Daily Living In your present state of health, do you have any difficulty performing the following activities: 07/02/2017  Hearing? N  Comment denies hearing aids  Vision? N  Comment wears eyeglasses  Difficulty concentrating or making decisions? Y  Comment short term memory loss  Walking or climbing stairs? Y  Comment joint pain, dyspnea  Dressing or bathing? N  Doing errands, shopping? N  Preparing Food and eating ? N  Comment denies dentures  Using the Toilet? N  In the past six months, have you accidently leaked urine? N  Do you have problems with loss of bowel control? N  Managing your Medications? N  Managing your Finances? N  Housekeeping or managing your Housekeeping? N  Some recent data might be hidden    Patient Care Team: Glean Hess, MD as PCP - General (Internal Medicine) Eulogio Bear, MD as Consulting Physician (Ophthalmology) Jannet Mantis, MD as  Consulting Physician (Dermatology)    Assessment:   This is a routine wellness examination for [place].  Exercise Activities and Dietary recommendations Current Exercise Habits: Structured exercise class, Type of exercise: Other - see comments(water aerobics), Time (Minutes): 60, Frequency (Times/Week): 3, Weekly Exercise (Minutes/Week): 180, Intensity: Mild, Exercise limited by: None identified  Goals    . DIET - INCREASE WATER INTAKE     Recommend to drink at least 6-8 8oz glasses of water per day.       Fall Risk Fall Risk  07/02/2017 06/26/2016 10/11/2015 06/11/2015 02/11/2015  Falls in the past year? No No No No No  Risk for fall due to : Impaired vision;Impaired balance/gait - - - -  Risk for fall due to: Comment wears eyeglasses, joint pain - - - -   FALL RISK PREVENTION PERTAINING TO HOME: Is your home free of loose throw rugs in walkways, pet beds, electrical cords, etc? Yes Is there adequate lighting in your home to reduce risk of falls?  Yes Are there stairs in or around your home WITH handrails? Yes  ASSISTIVE DEVICES UTILIZED TO PREVENT FALLS: Use of a cane, walker or w/c? Yes, use of walker Grab bars in the bathroom? Yes  Shower chair or a place to sit while bathing? Yes An elevated toilet seat or a handicapped toilet? Yes  Timed Get Up and Go Performed: Yes. Pt ambulated 10 feet within 23 sec. Gait slow, steady and without the use of an assistive device. No intervention required at this time. Fall risk prevention has been discussed.  Community Resource Referral:  [place] Referral not required at this time.   Depression Screen PHQ 2/9 Scores 07/02/2017 03/06/2017 06/26/2016 10/11/2015  PHQ - 2 Score 0 0 0 0  PHQ- 9 Score 0 8 - -     Cognitive Function     6CIT Screen 07/02/2017 06/26/2016  What Year? 0 points 0 points  What month? 0 points 0 points  What time? 0 points 0 points  Count back from 20 0 points 0 points  Months in reverse 0 points 0 points    Repeat phrase 0 points 0 points  Total Score 0 0    Immunization History  Administered Date(s) Administered  .  Hepatitis B 07/04/2011  . Hepatitis B, adult 07/04/2011  . Influenza, High Dose Seasonal PF 10/12/2016  . Influenza,inj,Quad PF,6+ Mos 09/29/2013, 09/29/2014, 10/11/2015  . Pneumococcal Conjugate-13 02/11/2015  . Pneumococcal Polysaccharide-23 01/10/2005, 06/26/2016  . Tdap 03/09/2012    Qualifies for Shingles Vaccine? Yes. Due for Shingrix. Education has been provided regarding the importance of this vaccine. Pt has been advised to call her insurance company to determine her out of pocket expense. Advised she may also receive this vaccine at her local pharmacy or Health Dept. Verbalized acceptance and understanding.  Screening Tests Health Maintenance  Topic Date Due  . OPHTHALMOLOGY EXAM  03/15/2017  . INFLUENZA VACCINE  08/09/2017  . HEMOGLOBIN A1C  09/03/2017  . FOOT EXAM  03/06/2018  . MAMMOGRAM  03/28/2018  . TETANUS/TDAP  03/10/2022  . COLONOSCOPY  06/24/2022  . DEXA SCAN  Completed  . Hepatitis C Screening  Completed  . PNA vac Low Risk Adult  Completed    Cancer Screenings: Lung: Low Dose CT Chest recommended if Age 55-80 years, 30 pack-year currently smoking OR have quit w/in 15years. Patient does not qualify. Breast:  Up to date on Mammogram? Yes. Completed 03/27/17. Repeat every year   Up to date of Bone Density/Dexa? Yes. Completed 11/12/14. Repeat every 2 years. Ordered today. Message sent to referral coordinator for scheduling purposes.  Colorectal: Completed 06/23/12. Repeat every 10 years  Additional Screenings: Hepatitis C Screening: Completed 02/11/15    Plan:  I have personally reviewed and addressed the Medicare Annual Wellness questionnaire and have noted the following in the patient's chart:  A. Medical and social history B. Use of alcohol, tobacco or illicit drugs  C. Current medications and supplements D. Functional ability and status E.   Nutritional status F.  Physical activity G. Advance directives H. List of other physicians I.  Hospitalizations, surgeries, and ER visits in previous 12 months J.  [place] such as hearing and vision if needed, cognitive and depression L. Referrals and appointments  In addition, I have reviewed and discussed with patient certain preventive protocols, quality metrics, and best practice recommendations. A written personalized care plan for preventive services as well as general preventive health recommendations were provided to patient.  Signed,  Aleatha Borer, LPN Nurse Health Advisor  MD Recommendations: Due for Shingrix. Education has been provided regarding the importance of this vaccine. Pt has been advised to call her insurance company to determine her out of pocket expense. Advised she may also receive this vaccine at her local pharmacy or Health Dept. Verbalized acceptance and understanding.  Bone Density/Dexa: Completed 11/12/14. Repeat every 2 years. Ordered today. Message sent to referral coordinator for scheduling purposes.

## 2017-07-02 NOTE — Patient Instructions (Signed)
Nancy Chen , Thank you for taking time to come for your Medicare Wellness Visit. I appreciate your ongoing commitment to your health goals. Please review the following plan we discussed and let me know if I can assist you in the future.   Screening recommendations/referrals: Colorectal Screening: Up to date Mammogram: Up to date Bone Density: You will receive a call from our office regarding your appointment  Vision and Dental Exams: Recommended annual ophthalmology exams for early detection of glaucoma and other disorders of the eye Recommended annual dental exams for proper oral hygiene  Diabetic Exams: Recommended annual diabetic eye exams for early detection of retinopathy Recommended annual diabetic foot exams for early detection of peripheral neuropathy.  Diabetic Eye Exam: Please keep your appointment with Dr. Edison Pace for completion of your diabetic eye exam Diabetic Foot Exam: Up to date  Vaccinations: Influenza vaccine: Up to date Pneumococcal vaccine: Up to date Tdap vaccine: Up to date Shingles vaccine: Please call your insurance company to determine your out of pocket expense for the Shingrix vaccine. You may also receive this vaccine at your local pharmacy or Health Dept.   Advanced directives: We have received a copy of your POA (Power of Kelly) and/or Living Will. These documents can be located in your chart.  Goals: Recommend to drink at least 6-8 8oz glasses of water per day.  Next appointment: Please schedule your Annual Wellness Visit with your Nurse Health Advisor in one year.  Preventive Care 1 Years and Older, Female Preventive care refers to lifestyle choices and visits with your health care provider that can promote health and wellness. What does preventive care include?  A yearly physical exam. This is also called an annual well check.  Dental exams once or twice a year.  Routine eye exams. Ask your health care provider how often you should have your  eyes checked.  Personal lifestyle choices, including:  Daily care of your teeth and gums.  Regular physical activity.  Eating a healthy diet.  Avoiding tobacco and drug use.  Limiting alcohol use.  Practicing safe sex.  Taking low-dose aspirin every day.  Taking vitamin and mineral supplements as recommended by your health care provider. What happens during an annual well check? The services and screenings done by your health care provider during your annual well check will depend on your age, overall health, lifestyle risk factors, and family history of disease. Counseling  Your health care provider may ask you questions about your:  Alcohol use.  Tobacco use.  Drug use.  Emotional well-being.  Home and relationship well-being.  Sexual activity.  Eating habits.  History of falls.  Memory and ability to understand (cognition).  Work and work Statistician.  Reproductive health. Screening  You may have the following tests or measurements:  Height, weight, and BMI.  Blood pressure.  Lipid and cholesterol levels. These may be checked every 5 years, or more frequently if you are over 69 years old.  Skin check.  Lung cancer screening. You may have this screening every year starting at age 38 if you have a 30-pack-year history of smoking and currently smoke or have quit within the past 15 years.  Fecal occult blood test (FOBT) of the stool. You may have this test every year starting at age 18.  Flexible sigmoidoscopy or colonoscopy. You may have a sigmoidoscopy every 5 years or a colonoscopy every 10 years starting at age 10.  Hepatitis C blood test.  Hepatitis B blood test.  Sexually transmitted  disease (STD) testing.  Diabetes screening. This is done by checking your blood sugar (glucose) after you have not eaten for a while (fasting). You may have this done every 1-3 years.  Bone density scan. This is done to screen for osteoporosis. You may have this  done starting at age 88.  Mammogram. This may be done every 1-2 years. Talk to your health care provider about how often you should have regular mammograms. Talk with your health care provider about your test results, treatment options, and if necessary, the need for more tests. Vaccines  Your health care provider may recommend certain vaccines, such as:  Influenza vaccine. This is recommended every year.  Tetanus, diphtheria, and acellular pertussis (Tdap, Td) vaccine. You may need a Td booster every 10 years.  Zoster vaccine. You may need this after age 46.  Pneumococcal 13-valent conjugate (PCV13) vaccine. One dose is recommended after age 65.  Pneumococcal polysaccharide (PPSV23) vaccine. One dose is recommended after age 74. Talk to your health care provider about which screenings and vaccines you need and how often you need them. This information is not intended to replace advice given to you by your health care provider. Make sure you discuss any questions you have with your health care provider. Document Released: 01/22/2015 Document Revised: 09/15/2015 Document Reviewed: 10/27/2014 Elsevier Interactive Patient Education  2017 [place] Prevention in the Home Falls can cause injuries. They can happen to people of all ages. There are many things you can do to make your home safe and to help prevent falls. What can I do on the outside of my home?  Regularly fix the edges of walkways and driveways and fix any cracks.  Remove anything that might make you trip as you walk through a door, such as a raised step or threshold.  Trim any bushes or trees on the path to your home.  Use bright outdoor lighting.  Clear any walking paths of anything that might make someone trip, such as rocks or tools.  Regularly check to see if handrails are loose or broken. Make sure that both sides of any steps have handrails.  Any raised decks and porches should have guardrails on the  edges.  Have any leaves, snow, or ice cleared regularly.  Use sand or salt on walking paths during winter.  Clean up any spills in your garage right away. This includes oil or grease spills. What can I do in the bathroom?  Use night lights.  Install grab bars by the toilet and in the tub and shower. Do not use towel bars as grab bars.  Use non-skid mats or decals in the tub or shower.  If you need to sit down in the shower, use a plastic, non-slip stool.  Keep the floor dry. Clean up any water that spills on the floor as soon as it happens.  Remove soap buildup in the tub or shower regularly.  Attach bath mats securely with double-sided non-slip rug tape.  Do not have throw rugs and other things on the floor that can make you trip. What can I do in the bedroom?  Use night lights.  Make sure that you have a light by your bed that is easy to reach.  Do not use any sheets or blankets that are too big for your bed. They should not hang down onto the floor.  Have a firm chair that has side arms. You can use this for support while you get dressed.  Do not have throw rugs and other things on the floor that can make you trip. What can I do in the kitchen?  Clean up any spills right away.  Avoid walking on wet floors.  Keep items that you use a lot in easy-to-reach places.  If you need to reach something above you, use a strong step stool that has a grab bar.  Keep electrical cords out of the way.  Do not use floor polish or wax that makes floors slippery. If you must use wax, use non-skid floor wax.  Do not have throw rugs and other things on the floor that can make you trip. What can I do with my stairs?  Do not leave any items on the stairs.  Make sure that there are handrails on both sides of the stairs and use them. Fix handrails that are broken or loose. Make sure that handrails are as long as the stairways.  Check any carpeting to make sure that it is firmly  attached to the stairs. Fix any carpet that is loose or worn.  Avoid having throw rugs at the top or bottom of the stairs. If you do have throw rugs, attach them to the floor with carpet tape.  Make sure that you have a light switch at the top of the stairs and the bottom of the stairs. If you do not have them, ask someone to add them for you. What else can I do to help prevent falls?  Wear shoes that:  Do not have high heels.  Have rubber bottoms.  Are comfortable and fit you well.  Are closed at the toe. Do not wear sandals.  If you use a stepladder:  Make sure that it is fully opened. Do not climb a closed stepladder.  Make sure that both sides of the stepladder are locked into place.  Ask someone to hold it for you, if possible.  Clearly mark and make sure that you can see:  Any grab bars or handrails.  First and last steps.  Where the edge of each step is.  Use tools that help you move around (mobility aids) if they are needed. These include:  Canes.  Walkers.  Scooters.  Crutches.  Turn on the lights when you go into a dark area. Replace any light bulbs as soon as they burn out.  Set up your furniture so you have a clear path. Avoid moving your furniture around.  If any of your floors are uneven, fix them.  If there are any pets around you, be aware of where they are.  Review your medicines with your doctor. Some medicines can make you feel dizzy. This can increase your chance of falling. Ask your doctor what other things that you can do to help prevent falls. This information is not intended to replace advice given to you by your health care provider. Make sure you discuss any questions you have with your health care provider. Document Released: 10/22/2008 Document Revised: 06/03/2015 Document Reviewed: 01/30/2014 Elsevier Interactive Patient Education  2017 [employer].

## 2017-07-05 ENCOUNTER — Ambulatory Visit: Payer: Self-pay

## 2017-07-05 ENCOUNTER — Encounter: Payer: Self-pay | Admitting: Internal Medicine

## 2017-07-05 ENCOUNTER — Ambulatory Visit (INDEPENDENT_AMBULATORY_CARE_PROVIDER_SITE_OTHER): Payer: Medicare Other | Admitting: Internal Medicine

## 2017-07-05 VITALS — BP 108/60 | HR 87 | Temp 98.0°F | Resp 16 | Ht 63.75 in | Wt 240.0 lb

## 2017-07-05 DIAGNOSIS — R21 Rash and other nonspecific skin eruption: Secondary | ICD-10-CM

## 2017-07-05 DIAGNOSIS — S46911A Strain of unspecified muscle, fascia and tendon at shoulder and upper arm level, right arm, initial encounter: Secondary | ICD-10-CM

## 2017-07-05 DIAGNOSIS — E119 Type 2 diabetes mellitus without complications: Secondary | ICD-10-CM | POA: Diagnosis not present

## 2017-07-05 DIAGNOSIS — G43009 Migraine without aura, not intractable, without status migrainosus: Secondary | ICD-10-CM

## 2017-07-05 DIAGNOSIS — E782 Mixed hyperlipidemia: Secondary | ICD-10-CM

## 2017-07-05 DIAGNOSIS — I10 Essential (primary) hypertension: Secondary | ICD-10-CM | POA: Diagnosis not present

## 2017-07-05 LAB — POCT URINALYSIS DIPSTICK
BILIRUBIN UA: NEGATIVE
Blood, UA: NEGATIVE
GLUCOSE UA: NEGATIVE
Ketones, UA: NEGATIVE
LEUKOCYTES UA: NEGATIVE
Nitrite, UA: NEGATIVE
Protein, UA: NEGATIVE
Spec Grav, UA: 1.015 (ref 1.010–1.025)
Urobilinogen, UA: 0.2 E.U./dL
pH, UA: 5 (ref 5.0–8.0)

## 2017-07-05 NOTE — Progress Notes (Signed)
Date:  07/05/2017   Name:  Nancy Chen   DOB:  1945-03-01   MRN:  [id number]   Chief Complaint: Hypertension Hypertension  This is a chronic problem. The problem is unchanged. The problem is controlled. Pertinent negatives include no chest pain, headaches, palpitations or shortness of breath. Past treatments include ACE inhibitors and diuretics. The current treatment provides significant improvement.  Diabetes  She presents for her follow-up diabetic visit. She has type 2 diabetes mellitus. Her disease course has been stable. Pertinent negatives for hypoglycemia include no dizziness, headaches, nervousness/anxiousness or tremors. Pertinent negatives for diabetes include no chest pain, no fatigue, no polydipsia and no polyuria. Current diabetic treatment includes oral agent (dual therapy). Her weight is stable. She is following a generally healthy diet. Her breakfast blood glucose is taken between 6-7 am. Her breakfast blood glucose range is generally 140-180 mg/dl. An ACE inhibitor/angiotensin II receptor blocker is being taken.  Hyperlipidemia  This is a chronic problem. Pertinent negatives include no chest pain or shortness of breath. Current antihyperlipidemic treatment includes statins. The current treatment provides significant improvement of lipids.  Migraine   This is a recurrent problem. The problem occurs intermittently. The pain is located in the left unilateral region. The pain is mild. Pertinent negatives include no abdominal pain, coughing, dizziness, fever, hearing loss, tinnitus or vomiting. She has tried Excedrin for the symptoms. The treatment provided significant relief. Her past medical history is significant for hypertension.  Rash  This is a new problem. The current episode started in the past 7 days. The problem is unchanged. The affected locations include the left lower leg and right lower leg. The rash is characterized by itchiness and redness. Pertinent negatives  include no congestion, cough, diarrhea, fatigue, fever, shortness of breath or vomiting. Past treatments include nothing.   Lab Results  Component Value Date   HGBA1C 6.7 (H) 03/06/2017     Review of Systems  Constitutional: Negative for chills, fatigue and fever.  HENT: Negative for congestion, hearing loss, tinnitus, trouble swallowing and voice change.   Eyes: Negative for visual disturbance.  Respiratory: Negative for cough, chest tightness, shortness of breath and wheezing.   Cardiovascular: Negative for chest pain, palpitations and leg swelling.  Gastrointestinal: Negative for abdominal pain, constipation, diarrhea and vomiting.  Endocrine: Negative for polydipsia and polyuria.  Genitourinary: Negative for dysuria, frequency, genital sores, vaginal bleeding and vaginal discharge.  Musculoskeletal: Negative for arthralgias, gait problem and joint swelling.  Skin: Positive for rash (on right and left leg). Negative for color change.       2 basal cell skin cancers - chest and shoulder - going for laser treatment next week  Neurological: Negative for dizziness, tremors, light-headedness and headaches.  Hematological: Negative for adenopathy. Does not bruise/bleed easily.  Psychiatric/Behavioral: Negative for dysphoric mood and sleep disturbance. The patient is not nervous/anxious.     Patient Active Problem List   Diagnosis Date Noted  . Facial basal cell cancer 10/31/2016  . Migraine 10/31/2016  . Sleep disorder 02/04/2016  . Lipoma of left upper extremity 10/11/2015  . Controlled type 2 diabetes mellitus without complication, without long-term current use of insulin ([place]) 02/11/2015  . Ovarian failure 11/10/2014  . Long-term use of high-risk medication 11/10/2014  . Essential (primary) hypertension 07/15/2014  . Mixed hyperlipidemia 07/15/2014  . Dermatophytosis of groin 07/15/2014  . Tricompartment osteoarthritis of knee 07/15/2014    Prior to Admission medications     Medication Sig Start Date End Date  Taking? Authorizing Provider  Acetaminophen (TYLENOL ARTHRITIS PAIN PO) Take 1 capsule by mouth 2 (two) times daily.   Yes [provider]  albuterol (PROVENTIL HFA;VENTOLIN HFA) 108 (90 Base) MCG/ACT inhaler Inhale 2 puffs into the lungs every 6 (six) hours as needed for wheezing or shortness of breath. 03/19/17  Yes Plonk, Gwyndolyn Saxon, MD  albuterol (PROVENTIL) (2.5 MG/3ML) 0.083% nebulizer solution Take 3 mLs (2.5 mg total) by nebulization every 6 (six) hours as needed for wheezing or shortness of breath. 03/19/17  Yes Plonk, Gwyndolyn Saxon, MD  budesonide-formoterol [facility]) 160-4.5 MCG/ACT inhaler Inhale 2 puffs into the lungs 2 (two) times daily. Patient taking differently: Inhale 2 puffs into the lungs 2 (two) times daily as needed.  03/19/17  Yes Plonk, Gwyndolyn Saxon, MD  cetirizine (ZYRTEC) 10 MG tablet Take 10 mg by mouth as needed.    Yes [provider]  cyanocobalamin 1000 MCG tablet Take 1 tablet by mouth daily.   Yes [provider]  glucose blood (ACCU-CHEK AVIVA) test strip  01/29/13  Yes [provider]  lisinopril-hydrochlorothiazide (PRINZIDE,ZESTORETIC) 20-12.5 MG tablet Take 1 tablet by mouth daily. 03/22/17  Yes Glean Hess, MD  metFORMIN (GLUCOPHAGE) 1000 MG tablet Take 1 tablet (1,000 mg total) by mouth 2 (two) times daily. 03/22/17  Yes Glean Hess, MD  Multiple Vitamins-Minerals (MULTIVITAMIN WITH MINERALS) tablet Take 1 tablet by mouth daily.   Yes [provider]  naproxen sodium (ANAPROX) 220 MG tablet Take 220 mg by mouth 2 (two) times daily with a meal.   Yes [provider]  Omega 3-6-9 Fatty Acids (OMEGA 3-6-9 COMPLEX PO) Take 1,600 mg by mouth daily.   Yes [provider]  Pyridoxine HCl (VITAMIN B6) 200 MG TABS Take 1 tablet by mouth 2 (two) times daily.    Yes [provider]  simvastatin (ZOCOR) 20 MG tablet Take 1 tablet (20 mg total) by mouth daily. 03/22/17  Yes  Glean Hess, MD  sitaGLIPtin (JANUVIA) 100 MG tablet Take 1 tablet (100 mg total) by mouth daily. 03/06/17  Yes Glean Hess, MD  traZODone (DESYREL) 50 MG tablet Take 0.5 tablets (25 mg total) by mouth at bedtime as needed for sleep. 03/28/17  Yes Glean Hess, MD    Allergies  Allergen Reactions  . Glipizide Swelling    hives  . Codeine Hives    Past Surgical History:  Procedure Laterality Date  . ABDOMINAL HYSTERECTOMY    . BASAL CELL CARCINOMA EXCISION    . CARPAL TUNNEL RELEASE Bilateral   . CARPAL TUNNEL RELEASE    . CATARACT EXTRACTION W/PHACO Left 09/07/2016   Procedure: CATARACT EXTRACTION PHACO AND INTRAOCULAR LENS PLACEMENT (IOC);  Surgeon: Eulogio Bear, MD;  Location: [facility] ORS;  Service: Ophthalmology;  Laterality: Left;  [place] 00:41.9 AP% 14.9 CDE 6.23 Fluid pack lot # [id number]  . CATARACT EXTRACTION W/PHACO Right 10/19/2016   Procedure: CATARACT EXTRACTION PHACO AND INTRAOCULAR LENS PLACEMENT ([place]);  Surgeon: Eulogio Bear, MD;  Location: [facility] ORS;  Service: Ophthalmology;  Laterality: Right;  casette lot # [id number] H [place]  00:59.5 AP%   16.5 CDE   9.79   . COLONOSCOPY  06/2012   normal (in [place])  . TONSILLECTOMY      Social History   Tobacco Use  . Smoking status: Never Smoker  . Smokeless tobacco: Never Used  . Tobacco comment: smoking cessation materials not required  Substance Use Topics  . Alcohol use: Not Currently  . Drug use: No  Medication list has been reviewed and updated.  Current Meds  Medication Sig  . Acetaminophen (TYLENOL ARTHRITIS PAIN PO) Take 1 capsule by mouth 2 (two) times daily.  Marland Kitchen albuterol (PROVENTIL HFA;VENTOLIN HFA) 108 (90 Base) MCG/ACT inhaler Inhale 2 puffs into the lungs every 6 (six) hours as needed for wheezing or shortness of breath.  Marland Kitchen albuterol (PROVENTIL) (2.5 MG/3ML) 0.083% nebulizer solution Take 3 mLs (2.5 mg total) by nebulization every 6 (six) hours as needed for wheezing or shortness of  breath.  . budesonide-formoterol (SYMBICORT) 160-4.5 MCG/ACT inhaler Inhale 2 puffs into the lungs 2 (two) times daily. (Patient taking differently: Inhale 2 puffs into the lungs 2 (two) times daily as needed. )  . cetirizine (ZYRTEC) 10 MG tablet Take 10 mg by mouth as needed.   . cyanocobalamin 1000 MCG tablet Take 1 tablet by mouth daily.  Marland Kitchen glucose blood (ACCU-CHEK AVIVA) test strip   . lisinopril-hydrochlorothiazide (PRINZIDE,ZESTORETIC) 20-12.5 MG tablet Take 1 tablet by mouth daily.  . metFORMIN (GLUCOPHAGE) 1000 MG tablet Take 1 tablet (1,000 mg total) by mouth 2 (two) times daily.  . Multiple Vitamins-Minerals (MULTIVITAMIN WITH MINERALS) tablet Take 1 tablet by mouth daily.  . naproxen sodium (ANAPROX) 220 MG tablet Take 220 mg by mouth 2 (two) times daily with a meal.  . Omega 3-6-9 Fatty Acids (OMEGA 3-6-9 COMPLEX PO) Take 1,600 mg by mouth daily.  . Pyridoxine HCl (VITAMIN B6) 200 MG TABS Take 1 tablet by mouth 2 (two) times daily.   . simvastatin (ZOCOR) 20 MG tablet Take 1 tablet (20 mg total) by mouth daily.  . sitaGLIPtin (JANUVIA) 100 MG tablet Take 1 tablet (100 mg total) by mouth daily.  . traZODone (DESYREL) 50 MG tablet Take 0.5 tablets (25 mg total) by mouth at bedtime as needed for sleep.   Current Facility-Administered Medications for the 07/05/17 encounter (Office Visit) with Glean Hess, MD  Medication  . albuterol (PROVENTIL) (2.5 MG/3ML) 0.083% nebulizer solution 2.5 mg    PHQ 2/9 Scores 07/05/2017 07/02/2017 03/06/2017 06/26/2016  PHQ - 2 Score 0 0 0 0  PHQ- 9 Score 0 0 8 -    Physical Exam  Constitutional: She is oriented to person, place, and time. She appears well-developed and well-nourished. No distress.  HENT:  Head: Normocephalic and atraumatic.  Right Ear: Tympanic membrane and ear canal normal.  Left Ear: Tympanic membrane and ear canal normal.  Nose: Right sinus exhibits no maxillary sinus tenderness. Left sinus exhibits no maxillary sinus  tenderness.  Mouth/Throat: Uvula is midline and oropharynx is clear and moist.  Eyes: Conjunctivae and EOM are normal. Right eye exhibits no discharge. Left eye exhibits no discharge. No scleral icterus.  Neck: Normal range of motion. Carotid bruit is not present. No erythema present. No thyromegaly present.  Cardiovascular: Normal rate, regular rhythm, normal heart sounds and normal pulses.  Pulmonary/Chest: Effort normal. No respiratory distress. She has no wheezes. Right breast exhibits no mass, no nipple discharge, no skin change and no tenderness. Left breast exhibits no mass, no nipple discharge, no skin change and no tenderness.  Abdominal: Soft. Bowel sounds are normal. There is no hepatosplenomegaly. There is no tenderness. There is no CVA tenderness.  Musculoskeletal:       Right shoulder: She exhibits decreased range of motion and tenderness. She exhibits no effusion, normal pulse and normal strength.  Lymphadenopathy:    She has no cervical adenopathy.    She has no axillary adenopathy.  Neurological: She is alert  and oriented to person, place, and time. She has normal reflexes. No cranial nerve deficit or sensory deficit.  Skin: Skin is warm, dry and intact. No rash noted.     Psychiatric: She has a normal mood and affect. Her speech is normal and behavior is normal. Thought content normal.  Nursing note and vitals reviewed.   BP 108/60   Pulse 87   Temp 98 F (36.7 C) (Oral)   Resp 16   Ht 5' 3.75" (1.619 m)   Wt 240 lb (108.9 kg)   SpO2 98%   BMI 41.52 kg/m   Assessment and Plan: 1. Essential (primary) hypertension controlled - CBC with Differential/Platelet - Comprehensive metabolic panel - TSH  2. Controlled type 2 diabetes mellitus without complication, without long-term current use of insulin ([facility]) Continue oral agents Continue exercise, healthy diet - Hemoglobin A1c - POCT urinalysis dipstick  3. Mixed hyperlipidemia On statin therapy - Lipid  panel  4. Rash and nonspecific skin eruption otc cortisone cream bid Consult dermatology if persistent  5. Migraine without aura and without status migrainosus, not intractable Stable, continue otc meds as needed  6. Strain of right shoulder, initial encounter Topical rubs, tylenol and avoid repeated strain/extreme ROM   No orders of the defined types were placed in this encounter.   Partially dictated using Editor, commissioning. Any errors are unintentional.  Halina Maidens, MD [facility]  07/05/2017   There are no diagnoses linked to this encounter.

## 2017-07-06 DIAGNOSIS — E119 Type 2 diabetes mellitus without complications: Secondary | ICD-10-CM | POA: Diagnosis not present

## 2017-07-06 LAB — HM DIABETES EYE EXAM

## 2017-07-06 LAB — COMPREHENSIVE METABOLIC PANEL
ALBUMIN: 4.9 g/dL — AB (ref 3.5–4.8)
ALK PHOS: 56 IU/L (ref 39–117)
ALT: 21 IU/L (ref 0–32)
AST: 20 IU/L (ref 0–40)
Albumin/Globulin Ratio: 2.2 (ref 1.2–2.2)
BUN/Creatinine Ratio: 34 — ABNORMAL HIGH (ref 12–28)
BUN: 22 mg/dL (ref 8–27)
Bilirubin Total: 0.3 mg/dL (ref 0.0–1.2)
CALCIUM: 9.8 mg/dL (ref 8.7–10.3)
CHLORIDE: 98 mmol/L (ref 96–106)
CO2: 24 mmol/L (ref 20–29)
CREATININE: 0.64 mg/dL (ref 0.57–1.00)
GFR calc Af Amer: 105 mL/min/{1.73_m2} (ref >59)
GFR calc non Af Amer: 91 mL/min/{1.73_m2} (ref >59)
GLOBULIN, TOTAL: 2.2 g/dL (ref 1.5–4.5)
Glucose: 140 mg/dL — ABNORMAL HIGH (ref 65–99)
Potassium: 4.7 mmol/L (ref 3.5–5.2)
Sodium: 139 mmol/L (ref 134–144)
Total Protein: 7.1 g/dL (ref 6.0–8.5)

## 2017-07-06 LAB — HEMOGLOBIN A1C
ESTIMATED AVERAGE GLUCOSE: 140 mg/dL
HEMOGLOBIN A1C: 6.5 % — AB (ref 4.8–5.6)

## 2017-07-06 LAB — CBC WITH DIFFERENTIAL/PLATELET
Basophils Absolute: 0 10*3/uL (ref 0.0–0.2)
Basos: 0 %
EOS (ABSOLUTE): 0.1 10*3/uL (ref 0.0–0.4)
EOS: 1 %
HEMATOCRIT: 41.9 % (ref 34.0–46.6)
HEMOGLOBIN: 13.8 g/dL (ref 11.1–15.9)
IMMATURE GRANULOCYTES: 0 %
Immature Grans (Abs): 0 10*3/uL (ref 0.0–0.1)
LYMPHS ABS: 1.6 10*3/uL (ref 0.7–3.1)
Lymphs: 36 %
MCH: 28.9 pg (ref 26.6–33.0)
MCHC: 32.9 g/dL (ref 31.5–35.7)
MCV: 88 fL (ref 79–97)
MONOCYTES: 11 %
Monocytes Absolute: 0.5 10*3/uL (ref 0.1–0.9)
NEUTROS PCT: 52 %
Neutrophils Absolute: 2.3 10*3/uL (ref 1.4–7.0)
Platelets: 241 10*3/uL (ref 150–450)
RBC: 4.77 x10E6/uL (ref 3.77–5.28)
RDW: 14.3 % (ref 12.3–15.4)
WBC: 4.5 10*3/uL (ref 3.4–10.8)

## 2017-07-06 LAB — LIPID PANEL
Chol/HDL Ratio: 3.6 ratio (ref 0.0–4.4)
Cholesterol, Total: 180 mg/dL (ref 100–199)
HDL: 50 mg/dL (ref >39)
LDL Calculated: 90 mg/dL (ref 0–99)
TRIGLYCERIDES: 202 mg/dL — AB (ref 0–149)
VLDL Cholesterol Cal: 40 mg/dL (ref 5–40)

## 2017-07-06 LAB — TSH: TSH: 1.37 u[IU]/mL (ref 0.450–4.500)

## 2017-07-09 ENCOUNTER — Encounter: Payer: Self-pay | Admitting: Internal Medicine

## 2017-07-11 DIAGNOSIS — C44519 Basal cell carcinoma of skin of other part of trunk: Secondary | ICD-10-CM | POA: Diagnosis not present

## 2017-07-25 ENCOUNTER — Telehealth: Payer: Self-pay

## 2017-07-25 DIAGNOSIS — C44519 Basal cell carcinoma of skin of other part of trunk: Secondary | ICD-10-CM | POA: Diagnosis not present

## 2017-07-25 DIAGNOSIS — C44612 Basal cell carcinoma of skin of right upper limb, including shoulder: Secondary | ICD-10-CM | POA: Diagnosis not present

## 2017-07-25 NOTE — Telephone Encounter (Signed)
Copied from [place] [id number]. Topic: General - Other >> Jul 24, 2017  2:52 PM Lazarus Gowda, CMA wrote: Patient called concerned that her Dexa from 06/2017 has not been scheduled. Please let me know if Dexa orders and Mammogram orders are not fallin the workqueue for [facility]. I thought that was fixed.

## 2017-08-02 ENCOUNTER — Other Ambulatory Visit: Payer: Medicare Other

## 2017-08-08 ENCOUNTER — Ambulatory Visit
Admission: RE | Admit: 2017-08-08 | Discharge: 2017-08-08 | Disposition: A | Discharge status: Home / Self Care | Payer: Medicare Other | Source: Ambulatory Visit | Attending: Internal Medicine | Admitting: Internal Medicine

## 2017-08-08 DIAGNOSIS — E2839 Other primary ovarian failure: Secondary | ICD-10-CM | POA: Insufficient documentation

## 2017-08-08 DIAGNOSIS — Z13820 Encounter for screening for osteoporosis: Secondary | ICD-10-CM | POA: Diagnosis not present

## 2017-08-08 DIAGNOSIS — Z780 Asymptomatic menopausal state: Secondary | ICD-10-CM | POA: Diagnosis not present

## 2017-08-08 DIAGNOSIS — C44519 Basal cell carcinoma of skin of other part of trunk: Secondary | ICD-10-CM | POA: Diagnosis not present

## 2017-08-08 DIAGNOSIS — M810 Age-related osteoporosis without current pathological fracture: Secondary | ICD-10-CM | POA: Insufficient documentation

## 2017-08-08 DIAGNOSIS — C44612 Basal cell carcinoma of skin of right upper limb, including shoulder: Secondary | ICD-10-CM | POA: Diagnosis not present

## 2017-09-23 ENCOUNTER — Other Ambulatory Visit: Payer: Self-pay | Admitting: Internal Medicine

## 2017-10-10 DIAGNOSIS — Z23 Encounter for immunization: Secondary | ICD-10-CM | POA: Diagnosis not present

## 2017-10-29 ENCOUNTER — Other Ambulatory Visit: Payer: Self-pay | Admitting: Internal Medicine

## 2017-11-06 ENCOUNTER — Ambulatory Visit (INDEPENDENT_AMBULATORY_CARE_PROVIDER_SITE_OTHER): Payer: Medicare Other | Admitting: Internal Medicine

## 2017-11-06 ENCOUNTER — Encounter: Payer: Self-pay | Admitting: Internal Medicine

## 2017-11-06 VITALS — BP 132/78 | HR 85 | Ht 63.0 in | Wt 242.0 lb

## 2017-11-06 DIAGNOSIS — E119 Type 2 diabetes mellitus without complications: Secondary | ICD-10-CM | POA: Diagnosis not present

## 2017-11-06 DIAGNOSIS — I10 Essential (primary) hypertension: Secondary | ICD-10-CM

## 2017-11-06 DIAGNOSIS — Z1231 Encounter for screening mammogram for malignant neoplasm of breast: Secondary | ICD-10-CM

## 2017-11-06 NOTE — Progress Notes (Signed)
Date:  11/06/2017   Name:  Nancy Chen   DOB:  02/16/1945   MRN:  [id number]   Chief Complaint: Diabetes  Diabetes  She presents for her follow-up diabetic visit. She has type 2 diabetes mellitus. Her disease course has been stable. Pertinent negatives for hypoglycemia include no headaches or tremors. Pertinent negatives for diabetes include no chest pain, no fatigue, no polydipsia and no polyuria. Current diabetic treatment includes oral agent (dual therapy). She is compliant with treatment all of the time. Her weight is stable. She participates in exercise three times a week. There is no change in her home blood glucose trend. Eye exam is current.  Hypertension  This is a chronic problem. The problem is unchanged. Pertinent negatives include no chest pain, headaches, palpitations or shortness of breath.   Lab Results  Component Value Date   HGBA1C 6.5 (H) 07/05/2017   Lab Results  Component Value Date   CHOL 180 07/05/2017   HDL 50 07/05/2017   LDLCALC 90 07/05/2017   TRIG 202 (H) 07/05/2017   CHOLHDL 3.6 07/05/2017     Review of Systems  Constitutional: Negative for appetite change, fatigue, fever and unexpected weight change.  HENT: Negative for tinnitus and trouble swallowing.   Eyes: Negative for visual disturbance.  Respiratory: Negative for cough, chest tightness and shortness of breath.   Cardiovascular: Negative for chest pain, palpitations and leg swelling.  Gastrointestinal: Negative for abdominal pain.  Endocrine: Negative for polydipsia and polyuria.  Genitourinary: Negative for dysuria and hematuria.  Musculoskeletal: Negative for arthralgias.  Neurological: Negative for tremors, numbness and headaches.  Psychiatric/Behavioral: Negative for dysphoric mood.    Patient Active Problem List   Diagnosis Date Noted  . Strain of right shoulder 07/05/2017  . Facial basal cell cancer 10/31/2016  . Migraine 10/31/2016  . Sleep disorder 02/04/2016  . Lipoma  of left upper extremity 10/11/2015  . Controlled type 2 diabetes mellitus without complication, without long-term current use of insulin ([place]) 02/11/2015  . Ovarian failure 11/10/2014  . Long-term use of high-risk medication 11/10/2014  . Essential (primary) hypertension 07/15/2014  . Mixed hyperlipidemia 07/15/2014  . Dermatophytosis of groin 07/15/2014  . Tricompartment osteoarthritis of knee 07/15/2014    Allergies  Allergen Reactions  . Glipizide Swelling    hives  . Codeine Hives    Past Surgical History:  Procedure Laterality Date  . ABDOMINAL HYSTERECTOMY    . BASAL CELL CARCINOMA EXCISION    . CARPAL TUNNEL RELEASE Bilateral   . CARPAL TUNNEL RELEASE    . CATARACT EXTRACTION W/PHACO Left 09/07/2016   Procedure: CATARACT EXTRACTION PHACO AND INTRAOCULAR LENS PLACEMENT (IOC);  Surgeon: Eulogio Bear, MD;  Location: [facility] ORS;  Service: Ophthalmology;  Laterality: Left;  [place] 00:41.9 AP% 14.9 CDE 6.23 Fluid pack lot # [id number]  . CATARACT EXTRACTION W/PHACO Right 10/19/2016   Procedure: CATARACT EXTRACTION PHACO AND INTRAOCULAR LENS PLACEMENT ([place]);  Surgeon: Eulogio Bear, MD;  Location: [facility] ORS;  Service: Ophthalmology;  Laterality: Right;  casette lot # [id number] H [place]  00:59.5 AP%   16.5 CDE   9.79   . COLONOSCOPY  06/2012   normal (in [place])  . TONSILLECTOMY      Social History   Tobacco Use  . Smoking status: Never Smoker  . Smokeless tobacco: Never Used  . Tobacco comment: smoking cessation materials not required  Substance Use Topics  . Alcohol use: Not Currently  . Drug use: No     Medication  list has been reviewed and updated.  Current Meds  Medication Sig  . Acetaminophen (TYLENOL ARTHRITIS PAIN PO) Take 1 capsule by mouth 2 (two) times daily.  Marland Kitchen albuterol (PROVENTIL HFA;VENTOLIN HFA) 108 (90 Base) MCG/ACT inhaler Inhale 2 puffs into the lungs every 6 (six) hours as needed for wheezing or shortness of breath.  Marland Kitchen albuterol (PROVENTIL) (2.5  MG/3ML) 0.083% nebulizer solution Take 3 mLs (2.5 mg total) by nebulization every 6 (six) hours as needed for wheezing or shortness of breath.  . budesonide-formoterol (SYMBICORT) 160-4.5 MCG/ACT inhaler Inhale 2 puffs into the lungs 2 (two) times daily. (Patient taking differently: Inhale 2 puffs into the lungs 2 (two) times daily as needed. )  . cetirizine (ZYRTEC) 10 MG tablet Take 10 mg by mouth as needed.   . cyanocobalamin 1000 MCG tablet Take 1 tablet by mouth daily.  Marland Kitchen glucose blood (ACCU-CHEK AVIVA) test strip   . lisinopril-hydrochlorothiazide (PRINZIDE,ZESTORETIC) 20-12.5 MG tablet Take 1 tablet by mouth daily.  . metFORMIN (GLUCOPHAGE) 1000 MG tablet TAKE 1 TABLET BY MOUTH TWICE A DAY  . Multiple Vitamins-Minerals (MULTIVITAMIN WITH MINERALS) tablet Take 1 tablet by mouth daily.  . naproxen sodium (ANAPROX) 220 MG tablet Take 220 mg by mouth 2 (two) times daily with a meal.  . Omega 3-6-9 Fatty Acids (OMEGA 3-6-9 COMPLEX PO) Take 1,600 mg by mouth daily.  . Pyridoxine HCl (VITAMIN B6) 200 MG TABS Take 1 tablet by mouth 2 (two) times daily.   . simvastatin (ZOCOR) 20 MG tablet TAKE 1 TABLET BY MOUTH EVERY DAY  . sitaGLIPtin (JANUVIA) 100 MG tablet Take 1 tablet (100 mg total) by mouth daily. (Patient taking differently: Take 100 mg by mouth every other day. )  . traZODone (DESYREL) 50 MG tablet Take 0.5 tablets (25 mg total) by mouth at bedtime as needed for sleep.   Current Facility-Administered Medications for the 11/06/17 encounter (Office Visit) with Glean Hess, MD  Medication  . albuterol (PROVENTIL) (2.5 MG/3ML) 0.083% nebulizer solution 2.5 mg    PHQ 2/9 Scores 07/05/2017 07/02/2017 03/06/2017 06/26/2016  PHQ - 2 Score 0 0 0 0  PHQ- 9 Score 0 0 8 -    Physical Exam  Constitutional: She is oriented to person, place, and time. She appears well-developed. No distress.  HENT:  Head: Normocephalic and atraumatic.  Neck: Normal range of motion. Neck supple.    Cardiovascular: Normal rate, regular rhythm and normal heart sounds.  Pulmonary/Chest: Effort normal and breath sounds normal. No respiratory distress.  Musculoskeletal: Normal range of motion.  Lymphadenopathy:    She has no cervical adenopathy.  Neurological: She is alert and oriented to person, place, and time.  Skin: Skin is warm and dry. No rash noted.     Psychiatric: She has a normal mood and affect. Her behavior is normal. Thought content normal.  Nursing note and vitals reviewed.   BP 132/78 (BP Location: Right Arm, Patient Position: Sitting, Cuff Size: Large)   Pulse 85   Ht 5\' 3"  (1.6 m)   Wt 242 lb (109.8 kg)   SpO2 96%   BMI 42.87 kg/m   Assessment and Plan: 1. Controlled type 2 diabetes mellitus without complication, without long-term current use of insulin ([facility]) Continue current therapy - sample of Januvia given (donut hole) - Hemoglobin A1c  2. Essential (primary) hypertension controlled - Basic metabolic panel  3. Encounter for screening mammogram for breast cancer - MM 3D SCREEN BREAST BILATERAL; Future   Partially dictated using Editor, commissioning.  Any errors are unintentional.  Halina Maidens, MD [facility]  11/06/2017

## 2017-11-07 LAB — HEMOGLOBIN A1C
ESTIMATED AVERAGE GLUCOSE: 166 mg/dL
Hgb A1c MFr Bld: 7.4 % — ABNORMAL HIGH (ref 4.8–5.6)

## 2017-11-07 LAB — BASIC METABOLIC PANEL
BUN/Creatinine Ratio: 21 (ref 12–28)
BUN: 15 mg/dL (ref 8–27)
CO2: 23 mmol/L (ref 20–29)
CREATININE: 0.72 mg/dL (ref 0.57–1.00)
Calcium: 10.2 mg/dL (ref 8.7–10.3)
Chloride: 103 mmol/L (ref 96–106)
GFR calc Af Amer: 98 mL/min/{1.73_m2} (ref >59)
GFR, EST NON AFRICAN AMERICAN: 85 mL/min/{1.73_m2} (ref >59)
Glucose: 154 mg/dL — ABNORMAL HIGH (ref 65–99)
Potassium: 4.4 mmol/L (ref 3.5–5.2)
Sodium: 144 mmol/L (ref 134–144)

## 2017-12-14 ENCOUNTER — Other Ambulatory Visit: Payer: Self-pay

## 2017-12-14 DIAGNOSIS — E119 Type 2 diabetes mellitus without complications: Secondary | ICD-10-CM

## 2017-12-14 MED ORDER — METFORMIN HCL 1000 MG PO TABS: 1000.0000 mg | ORAL_TABLET | Freq: Two times a day (BID) | ORAL | 0 refills | Status: DC

## 2017-12-14 MED ORDER — SIMVASTATIN 20 MG PO TABS: 20.0000 mg | ORAL_TABLET | Freq: Every day | ORAL | 0 refills | Status: DC

## 2017-12-14 MED ORDER — LISINOPRIL-HYDROCHLOROTHIAZIDE 20-12.5 MG PO TABS: 1.0000 | ORAL_TABLET | Freq: Every day | ORAL | 0 refills | Status: DC

## 2017-12-14 MED ORDER — SITAGLIPTIN PHOSPHATE 100 MG PO TABS: 100.0000 mg | ORAL_TABLET | Freq: Every day | ORAL | 0 refills | Status: DC

## 2017-12-22 ENCOUNTER — Other Ambulatory Visit: Payer: Self-pay | Admitting: Internal Medicine

## 2018-01-28 DIAGNOSIS — Z872 Personal history of diseases of the skin and subcutaneous tissue: Secondary | ICD-10-CM | POA: Diagnosis not present

## 2018-01-28 DIAGNOSIS — Z86018 Personal history of other benign neoplasm: Secondary | ICD-10-CM | POA: Diagnosis not present

## 2018-01-28 DIAGNOSIS — Z859 Personal history of malignant neoplasm, unspecified: Secondary | ICD-10-CM | POA: Diagnosis not present

## 2018-01-28 DIAGNOSIS — Z85828 Personal history of other malignant neoplasm of skin: Secondary | ICD-10-CM | POA: Diagnosis not present

## 2018-01-28 DIAGNOSIS — L570 Actinic keratosis: Secondary | ICD-10-CM | POA: Diagnosis not present

## 2018-01-28 DIAGNOSIS — L910 Hypertrophic scar: Secondary | ICD-10-CM | POA: Diagnosis not present

## 2018-01-28 DIAGNOSIS — D492 Neoplasm of unspecified behavior of bone, soft tissue, and skin: Secondary | ICD-10-CM | POA: Diagnosis not present

## 2018-03-13 ENCOUNTER — Ambulatory Visit (INDEPENDENT_AMBULATORY_CARE_PROVIDER_SITE_OTHER): Payer: Medicare Other | Admitting: Internal Medicine

## 2018-03-13 ENCOUNTER — Other Ambulatory Visit: Payer: Self-pay

## 2018-03-13 ENCOUNTER — Encounter: Payer: Self-pay | Admitting: Internal Medicine

## 2018-03-13 VITALS — BP 124/70 | HR 77 | Ht 63.0 in | Wt 240.0 lb

## 2018-03-13 DIAGNOSIS — E782 Mixed hyperlipidemia: Secondary | ICD-10-CM | POA: Diagnosis not present

## 2018-03-13 DIAGNOSIS — I10 Essential (primary) hypertension: Secondary | ICD-10-CM

## 2018-03-13 DIAGNOSIS — E119 Type 2 diabetes mellitus without complications: Secondary | ICD-10-CM

## 2018-03-13 NOTE — Progress Notes (Signed)
Date:  03/13/2018   Name:  Nancy Chen   DOB:  02/20/1945   MRN:  [id number]   Chief Complaint: Diabetes (6 month follow up. Foot exam. )  Diabetes  She presents for her follow-up diabetic visit. She has type 2 diabetes mellitus. Her disease course has been stable. Pertinent negatives for hypoglycemia include no headaches or tremors. Pertinent negatives for diabetes include no chest pain, no fatigue, no polydipsia and no polyuria. She monitors blood glucose at home 1-2 x per day. Her breakfast blood glucose is taken between 7-8 am. Her breakfast blood glucose range is generally 140-180 mg/dl. An ACE inhibitor/angiotensin II receptor blocker is being taken. Eye exam is current.  Hypertension  This is a chronic problem. The problem is unchanged. The problem is controlled. Pertinent negatives include no chest pain, headaches, neck pain, palpitations or shortness of breath. Past treatments include ACE inhibitors and diuretics.   Lab Results  Component Value Date   HGBA1C 7.4 (H) 11/06/2017     Review of Systems  Constitutional: Negative for appetite change, fatigue, fever and unexpected weight change.  HENT: Negative for tinnitus and trouble swallowing.   Eyes: Negative for visual disturbance.  Respiratory: Negative for cough, chest tightness and shortness of breath.   Cardiovascular: Negative for chest pain, palpitations and leg swelling.  Gastrointestinal: Negative for abdominal pain.  Endocrine: Negative for polydipsia and polyuria.  Genitourinary: Negative for dysuria and hematuria.  Musculoskeletal: Negative for arthralgias, joint swelling and neck pain.  Neurological: Negative for tremors, numbness and headaches.  Hematological: Negative for adenopathy.  Psychiatric/Behavioral: Negative for dysphoric mood and sleep disturbance.    Patient Active Problem List   Diagnosis Date Noted  . Strain of right shoulder 07/05/2017  . Facial basal cell cancer 10/31/2016  . Migraine  10/31/2016  . Sleep disorder 02/04/2016  . Lipoma of left upper extremity 10/11/2015  . Controlled type 2 diabetes mellitus without complication, without long-term current use of insulin ([place]) 02/11/2015  . Ovarian failure 11/10/2014  . Long-term use of high-risk medication 11/10/2014  . Essential (primary) hypertension 07/15/2014  . Mixed hyperlipidemia 07/15/2014  . Dermatophytosis of groin 07/15/2014  . Tricompartment osteoarthritis of knee 07/15/2014    Allergies  Allergen Reactions  . Glipizide Swelling    hives  . Codeine Hives    Past Surgical History:  Procedure Laterality Date  . ABDOMINAL HYSTERECTOMY    . BASAL CELL CARCINOMA EXCISION    . CARPAL TUNNEL RELEASE Bilateral   . CARPAL TUNNEL RELEASE    . CATARACT EXTRACTION W/PHACO Left 09/07/2016   Procedure: CATARACT EXTRACTION PHACO AND INTRAOCULAR LENS PLACEMENT (IOC);  Surgeon: Eulogio Bear, MD;  Location: [facility] ORS;  Service: Ophthalmology;  Laterality: Left;  [place] 00:41.9 AP% 14.9 CDE 6.23 Fluid pack lot # [id number]  . CATARACT EXTRACTION W/PHACO Right 10/19/2016   Procedure: CATARACT EXTRACTION PHACO AND INTRAOCULAR LENS PLACEMENT ([place]);  Surgeon: Eulogio Bear, MD;  Location: [facility] ORS;  Service: Ophthalmology;  Laterality: Right;  casette lot # [id number] H [place]  00:59.5 AP%   16.5 CDE   9.79   . COLONOSCOPY  06/2012   normal (in [place])  . TONSILLECTOMY      Social History   Tobacco Use  . Smoking status: Never Smoker  . Smokeless tobacco: Never Used  . Tobacco comment: smoking cessation materials not required  Substance Use Topics  . Alcohol use: Not Currently  . Drug use: No     Medication list has been  reviewed and updated.  Current Meds  Medication Sig  . Acetaminophen (TYLENOL ARTHRITIS PAIN PO) Take 1 capsule by mouth 2 (two) times daily.  Marland Kitchen albuterol (PROVENTIL HFA;VENTOLIN HFA) 108 (90 Base) MCG/ACT inhaler Inhale 2 puffs into the lungs every 6 (six) hours as needed for wheezing or  shortness of breath.  Marland Kitchen albuterol (PROVENTIL) (2.5 MG/3ML) 0.083% nebulizer solution Take 3 mLs (2.5 mg total) by nebulization every 6 (six) hours as needed for wheezing or shortness of breath.  . budesonide-formoterol (SYMBICORT) 160-4.5 MCG/ACT inhaler Inhale 2 puffs into the lungs 2 (two) times daily. (Patient taking differently: Inhale 2 puffs into the lungs 2 (two) times daily as needed. )  . cetirizine (ZYRTEC) 10 MG tablet Take 10 mg by mouth as needed.   . cyanocobalamin 1000 MCG tablet Take 1 tablet by mouth daily.  Marland Kitchen glucose blood (ACCU-CHEK AVIVA) test strip   . lisinopril-hydrochlorothiazide (PRINZIDE,ZESTORETIC) 20-12.5 MG tablet TAKE 1 TABLET BY MOUTH EVERY DAY  . metFORMIN (GLUCOPHAGE) 1000 MG tablet Take 1 tablet (1,000 mg total) by mouth 2 (two) times daily.  . Multiple Vitamins-Minerals (MULTIVITAMIN WITH MINERALS) tablet Take 1 tablet by mouth daily.  . naproxen sodium (ANAPROX) 220 MG tablet Take 220 mg by mouth 2 (two) times daily with a meal.  . Omega 3-6-9 Fatty Acids (OMEGA 3-6-9 COMPLEX PO) Take 1,600 mg by mouth daily.  . Pyridoxine HCl (VITAMIN B6) 200 MG TABS Take 1 tablet by mouth 2 (two) times daily.   . simvastatin (ZOCOR) 20 MG tablet Take 1 tablet (20 mg total) by mouth daily.  . sitaGLIPtin (JANUVIA) 100 MG tablet Take 1 tablet (100 mg total) by mouth daily.  . traZODone (DESYREL) 50 MG tablet Take 0.5 tablets (25 mg total) by mouth at bedtime as needed for sleep.   Current Facility-Administered Medications for the 03/13/18 encounter (Office Visit) with Glean Hess, MD  Medication  . albuterol (PROVENTIL) (2.5 MG/3ML) 0.083% nebulizer solution 2.5 mg    PHQ 2/9 Scores 03/13/2018 07/05/2017 07/02/2017 03/06/2017  PHQ - 2 Score 0 0 0 0  PHQ- 9 Score - 0 0 8   Wt Readings from Last 3 Encounters:  03/13/18 240 lb (108.9 kg)  11/06/17 242 lb (109.8 kg)  07/05/17 240 lb (108.9 kg)    Physical Exam Vitals signs and nursing note reviewed.  Constitutional:       General: She is not in acute distress.    Appearance: She is well-developed.  HENT:     Head: Normocephalic and atraumatic.  Eyes:     Pupils: Pupils are equal, round, and reactive to light.  Neck:     Musculoskeletal: Normal range of motion and neck supple.     Vascular: No carotid bruit.  Cardiovascular:     Rate and Rhythm: Regular rhythm. Tachycardia present.     Pulses: Normal pulses.  Pulmonary:     Effort: Pulmonary effort is normal. No respiratory distress.     Breath sounds: Normal breath sounds. No wheezing or rales.  Musculoskeletal: Normal range of motion.     Right lower leg: No edema.     Left lower leg: No edema.  Lymphadenopathy:     Cervical: No cervical adenopathy.  Skin:    General: Skin is warm and dry.     Findings: No rash.  Neurological:     Mental Status: She is alert and oriented to person, place, and time.  Psychiatric:        Behavior: Behavior normal.  Thought Content: Thought content normal.     BP 124/70   Pulse 77   Ht 5\' 3"  (1.6 m)   Wt 240 lb (108.9 kg)   SpO2 95%   BMI 42.51 kg/m   Assessment and Plan: 1. Controlled type 2 diabetes mellitus without complication, without long-term current use of insulin ([place]) Needs to work on diet and weight loss Continue same medications for now Next step is insulin  - Basic metabolic panel - Hemoglobin A1c  2. Essential (primary) hypertension controlled  3. Mixed hyperlipidemia On statin therapy   Partially dictated using Editor, commissioning. Any errors are unintentional.  Halina Maidens, MD [facility]  03/13/2018

## 2018-03-14 LAB — BASIC METABOLIC PANEL
BUN/Creatinine Ratio: 17 (ref 12–28)
BUN: 12 mg/dL (ref 8–27)
CO2: 22 mmol/L (ref 20–29)
CREATININE: 0.69 mg/dL (ref 0.57–1.00)
Calcium: 10.3 mg/dL (ref 8.7–10.3)
Chloride: 98 mmol/L (ref 96–106)
GFR calc Af Amer: 102 mL/min/{1.73_m2} (ref >59)
GFR calc non Af Amer: 88 mL/min/{1.73_m2} (ref >59)
Glucose: 187 mg/dL — ABNORMAL HIGH (ref 65–99)
Potassium: 4.2 mmol/L (ref 3.5–5.2)
Sodium: 143 mmol/L (ref 134–144)

## 2018-03-14 LAB — HEMOGLOBIN A1C
ESTIMATED AVERAGE GLUCOSE: 186 mg/dL
Hgb A1c MFr Bld: 8.1 % — ABNORMAL HIGH (ref 4.8–5.6)

## 2018-03-21 ENCOUNTER — Other Ambulatory Visit: Payer: Self-pay | Admitting: Internal Medicine

## 2018-03-21 DIAGNOSIS — E119 Type 2 diabetes mellitus without complications: Secondary | ICD-10-CM

## 2018-04-01 ENCOUNTER — Ambulatory Visit: Payer: Medicare Other

## 2018-04-04 ENCOUNTER — Other Ambulatory Visit: Payer: Self-pay | Admitting: Internal Medicine

## 2018-05-13 ENCOUNTER — Ambulatory Visit: Payer: Medicare Other

## 2018-06-28 ENCOUNTER — Telehealth: Payer: Self-pay | Admitting: Internal Medicine

## 2018-06-28 NOTE — Telephone Encounter (Signed)
~  LM to confirm in-OFFICE or  TELE-AWV 6/19 knb

## 2018-07-01 DIAGNOSIS — Z85828 Personal history of other malignant neoplasm of skin: Secondary | ICD-10-CM | POA: Diagnosis not present

## 2018-07-01 DIAGNOSIS — Z859 Personal history of malignant neoplasm, unspecified: Secondary | ICD-10-CM | POA: Diagnosis not present

## 2018-07-01 DIAGNOSIS — L578 Other skin changes due to chronic exposure to nonionizing radiation: Secondary | ICD-10-CM | POA: Diagnosis not present

## 2018-07-01 DIAGNOSIS — L570 Actinic keratosis: Secondary | ICD-10-CM | POA: Diagnosis not present

## 2018-07-03 ENCOUNTER — Ambulatory Visit (INDEPENDENT_AMBULATORY_CARE_PROVIDER_SITE_OTHER): Payer: Medicare Other

## 2018-07-03 VITALS — Ht 63.0 in | Wt 238.2 lb

## 2018-07-03 DIAGNOSIS — Z0000 Encounter for general adult medical examination without abnormal findings: Secondary | ICD-10-CM | POA: Diagnosis not present

## 2018-07-03 NOTE — Progress Notes (Signed)
Subjective:   Nancy Chen is a 73 y.o. female who presents for Medicare Annual (Subsequent) preventive examination.  Virtual Visit via Telephone Note  I connected with Nancy Chen on 07/03/18 at 10:40 AM EDT by telephone and verified that I am speaking with the correct person using two identifiers.  Medicare Annual Wellness visit completed telephonically due to Covid-19 pandemic.   Location: Patient: home Provider: office   I discussed the limitations, risks, security and privacy concerns of performing an evaluation and management service by telephone and the availability of in person appointments. The patient expressed understanding and agreed to proceed.  Some vital signs may be absent or patient reported. Pt does not have a blood pressure monitor at home to record blood pressure.    Clemetine Marker, LPN   Review of Systems:   Cardiac Risk Factors include: advanced age (>56men, >5 women);diabetes mellitus;hypertension;dyslipidemia;obesity (BMI >30kg/m2)     Objective:     Vitals: Ht 5\' 3"  (1.6 m)   Wt 238 lb 3.2 oz (108 kg)   BMI 42.20 kg/m   Body mass index is 42.2 kg/m.  Advanced Directives 07/03/2018 07/02/2017 09/07/2016 06/26/2016 03/26/2015 02/11/2015  Does Patient Have a Medical Advance Directive? Yes Yes Yes Yes Yes Yes  Type of Paramedic of [place];Living will [place];Living will Living will;Healthcare Power of [place];Living will [place];Living will [place];Living will  Does patient want to make changes to medical advance directive? - - No - Patient declined - - -  Copy of [place] in Chart? Yes - validated most recent copy scanned in chart (See row information) Yes No - copy requested Yes - -    Tobacco Social History   Tobacco Use  Smoking Status Never Smoker  Smokeless Tobacco Never Used  Tobacco Comment   smoking cessation materials not required     Counseling given: Not Answered Comment: smoking cessation materials not required   Clinical Intake:  Pre-visit preparation completed: Yes  Pain : 0-10 Pain Score: 6  Pain Type: Chronic pain Pain Location: Leg Pain Orientation: Left, Right Pain Descriptors / Indicators: Aching, Discomfort Pain Onset: More than a month ago Pain Frequency: Constant     BMI - recorded: 42.2 Nutritional Status: BMI > 30  Obese Nutritional Risks: None Diabetes: Yes CBG done?: No Did pt. bring in CBG monitor from home?: No   Nutrition Risk Assessment:  Has the patient had any N/V/D within the last 2 months?  No  Does the patient have any non-healing wounds?  No  Has the patient had any unintentional weight loss or weight gain?  No   Diabetes:  Is the patient diabetic?  Yes  If diabetic, was a CBG obtained today?  No  Did the patient bring in their glucometer from home?  No  How often do you monitor your CBG's? daily.   Financial Strains and Diabetes Management:  Are you having any financial strains with the device, your supplies or your medication? No .  Does the patient want to be seen by Chronic Care Management for management of their diabetes?  No  Would the patient like to be referred to a Nutritionist or for Diabetic Management?  No   Diabetic Exams:  Diabetic Eye Exam: Completed 07/06/17 negative retinopathy.   Diabetic Foot Exam: Completed 03/13/18.   How often do you need to have someone help you when you read instructions, pamphlets,  or other written materials from your doctor or pharmacy?: 1 - Never  Interpreter Needed?: No  Information entered by :: Clemetine Marker LPN  Past Medical History:  Diagnosis Date  . Allergy   . Anxiety   . Arthritis   . Bronchitis   . Diabetes mellitus without complication ([place])   . Facial basal cell cancer 10/31/2016   also left lower eyelid  . Hypercholesteremia   . Hypertension   . Lower  extremity edema    Past Surgical History:  Procedure Laterality Date  . ABDOMINAL HYSTERECTOMY    . BASAL CELL CARCINOMA EXCISION    . CARPAL TUNNEL RELEASE Bilateral   . CARPAL TUNNEL RELEASE    . CATARACT EXTRACTION W/PHACO Left 09/07/2016   Procedure: CATARACT EXTRACTION PHACO AND INTRAOCULAR LENS PLACEMENT (IOC);  Surgeon: Eulogio Bear, MD;  Location: [facility] ORS;  Service: Ophthalmology;  Laterality: Left;  [place] 00:41.9 AP% 14.9 CDE 6.23 Fluid pack lot # [id number]  . CATARACT EXTRACTION W/PHACO Right 10/19/2016   Procedure: CATARACT EXTRACTION PHACO AND INTRAOCULAR LENS PLACEMENT ([place]);  Surgeon: Eulogio Bear, MD;  Location: [facility] ORS;  Service: Ophthalmology;  Laterality: Right;  casette lot # [id number] [place]  00:59.5 AP%   16.5 CDE   9.79   . COLONOSCOPY  06/2012   normal (in [place])  . TONSILLECTOMY     Family History  Problem Relation Age of Onset  . Alzheimer's disease Mother   . CAD Father   . Kidney disease Father   . Breast cancer Maternal Grandmother        great gm  . Diabetes Maternal Aunt   . Kidney disease Maternal Aunt    Social History   Socioeconomic History  . Marital status: Widowed    Spouse name: Not on file  . Number of children: 1  . Years of education: Not on file  . Highest education level: 12th grade  Occupational History  . Occupation: Retired  Scientific laboratory technician  . Financial resource strain: Not hard at all  . Food insecurity    Worry: Never true    Inability: Never true  . Transportation needs    Medical: No    Non-medical: No  Tobacco Use  . Smoking status: Never Smoker  . Smokeless tobacco: Never Used  . Tobacco comment: smoking cessation materials not required  Substance and Sexual Activity  . Alcohol use: Not Currently  . Drug use: No  . Sexual activity: Never  Lifestyle  . Physical activity    Days per week: 3 days    Minutes per session: 60 min  . Stress: Only a little  Relationships  . Social connections    Talks on phone:  More than three times a week    Gets together: Twice a week    Attends religious service: Never    Active member of club or organization: No    Attends meetings of clubs or organizations: Never    Relationship status: Widowed  Other Topics Concern  . Not on file  Social History Narrative  . Not on file    Outpatient Encounter Medications as of 07/03/2018  Medication Sig  . Acetaminophen (TYLENOL ARTHRITIS PAIN PO) Take 1 capsule by mouth 2 (two) times daily.  Marland Kitchen albuterol (PROVENTIL HFA;VENTOLIN HFA) 108 (90 Base) MCG/ACT inhaler Inhale 2 puffs into the lungs every 6 (six) hours as needed for wheezing or shortness of breath.  Marland Kitchen albuterol (PROVENTIL) (2.5 MG/3ML) 0.083% nebulizer solution Take 3 mLs (2.5 mg  total) by nebulization every 6 (six) hours as needed for wheezing or shortness of breath.  . budesonide-formoterol (SYMBICORT) 160-4.5 MCG/ACT inhaler Inhale 2 puffs into the lungs 2 (two) times daily. (Patient taking differently: Inhale 2 puffs into the lungs 2 (two) times daily as needed. )  . cyanocobalamin 1000 MCG tablet Take 1 tablet by mouth daily.  Marland Kitchen JANUVIA 100 MG tablet TAKE 1 TABLET EVERY DAY  . lisinopril-hydrochlorothiazide (PRINZIDE,ZESTORETIC) 20-12.5 MG tablet TAKE 1 TABLET EVERY DAY  . metFORMIN (GLUCOPHAGE) 1000 MG tablet TAKE 1 TABLET BY MOUTH TWICE A DAY  . Multiple Vitamins-Minerals (MULTIVITAMIN WITH MINERALS) tablet Take 1 tablet by mouth daily.  . naproxen sodium (ANAPROX) 220 MG tablet Take 220 mg by mouth 2 (two) times daily with a meal. PRN  . Omega 3-6-9 Fatty Acids (OMEGA 3-6-9 COMPLEX PO) Take 1,600 mg by mouth daily.  . Pyridoxine HCl (VITAMIN B6) 200 MG TABS Take 1 tablet by mouth 2 (two) times daily.   . simvastatin (ZOCOR) 20 MG tablet TAKE 1 TABLET EVERY DAY  . traZODone (DESYREL) 50 MG tablet Take 0.5 tablets (25 mg total) by mouth at bedtime as needed for sleep. (Patient taking differently: Take 25 mg by mouth at bedtime as needed for sleep. Pt takes  very rarely)  . cetirizine (ZYRTEC) 10 MG tablet Take 10 mg by mouth as needed.   Marland Kitchen glucose blood (ACCU-CHEK AVIVA) test strip    Facility-Administered Encounter Medications as of 07/03/2018  Medication  . albuterol (PROVENTIL) (2.5 MG/3ML) 0.083% nebulizer solution 2.5 mg    Activities of Daily Living In your present state of health, do you have any difficulty performing the following activities: 07/03/2018  Hearing? N  Comment declines hearing aids  Vision? N  Difficulty concentrating or making decisions? N  Walking or climbing stairs? N  Dressing or bathing? N  Doing errands, shopping? N  Preparing Food and eating ? N  Using the Toilet? N  In the past six months, have you accidently leaked urine? Y  Comment wears pads for protection  Do you have problems with loss of bowel control? N  Managing your Medications? N  Managing your Finances? N  Housekeeping or managing your Housekeeping? N  Some recent data might be hidden    Patient Care Team: Glean Hess, MD as PCP - General (Internal Medicine) Eulogio Bear, MD as Consulting Physician (Ophthalmology) Jannet Mantis, MD as Consulting Physician (Dermatology)    Assessment:   This is a routine wellness examination for Shannon.  Exercise Activities and Dietary recommendations Current Exercise Habits: The patient does not participate in regular exercise at present(active at home and in yard), Exercise limited by: orthopedic condition(s)  Goals    . DIET - INCREASE WATER INTAKE     Recommend to drink at least 6-8 8oz glasses of water per day.       Fall Risk Fall Risk  07/03/2018 03/13/2018 07/05/2017 07/02/2017 06/26/2016  Falls in the past year? 0 1 No No No  Number falls in past yr: 0 1 - - -  Injury with Fall? 0 0 - - -  Risk for fall due to : Impaired balance/gait Impaired balance/gait;History of fall(s);Impaired mobility - Impaired vision;Impaired balance/gait -  Risk for fall due to: Comment - - -  wears eyeglasses, joint pain -  Follow up Falls prevention discussed Falls evaluation completed - - -   FALL RISK PREVENTION PERTAINING TO THE HOME:  Any stairs in or around the home?  Yes  If so, do they handrails? Yes   Home free of loose throw rugs in walkways, pet beds, electrical cords, etc? Yes  Adequate lighting in your home to reduce risk of falls? Yes   ASSISTIVE DEVICES UTILIZED TO PREVENT FALLS:  Life alert? No  Use of a cane, walker or w/c? Yes  Grab bars in the bathroom? Yes  Shower chair or bench in shower? Yes  Elevated toilet seat or a handicapped toilet? Yes   DME ORDERS:  DME order needed?  No   TIMED UP AND GO:  Was the test performed? No . Telephonic visit.  Education: Fall risk prevention has been discussed.  Intervention(s) required? No   Depression Screen PHQ 2/9 Scores 07/03/2018 03/13/2018 07/05/2017 07/02/2017  PHQ - 2 Score 0 0 0 0  PHQ- 9 Score - - 0 0     Cognitive Function     6CIT Screen 07/03/2018 07/02/2017 06/26/2016  What Year? 0 points 0 points 0 points  What month? 0 points 0 points 0 points  What time? 0 points 0 points 0 points  Count back from 20 0 points 0 points 0 points  Months in reverse 0 points 0 points 0 points  Repeat phrase 0 points 0 points 0 points  Total Score 0 0 0    Immunization History  Administered Date(s) Administered  . Hepatitis B 07/04/2011  . Hepatitis B, adult 07/04/2011  . Influenza, High Dose Seasonal PF 10/12/2016, 10/10/2017  . Influenza,inj,Quad PF,6+ Mos 09/29/2013, 09/29/2014, 10/11/2015  . Pneumococcal Conjugate-13 02/11/2015  . Pneumococcal Polysaccharide-23 01/10/2005, 06/26/2016  . Tdap 03/09/2012  . Zoster Recombinat (Shingrix) 07/09/2017, 09/25/2017    Qualifies for Shingles Vaccine? Yes  Shingrix series completed.  Tdap: Up to date  Flu Vaccine: Up to date  Pneumococcal Vaccine: Up to date   Screening Tests Health Maintenance  Topic Date Due  . MAMMOGRAM  03/28/2018  .  OPHTHALMOLOGY EXAM  07/07/2018  . INFLUENZA VACCINE  08/10/2018  . HEMOGLOBIN A1C  09/13/2018  . FOOT EXAM  03/13/2019  . TETANUS/TDAP  03/10/2022  . COLONOSCOPY  06/24/2022  . DEXA SCAN  Completed  . Hepatitis C Screening  Completed  . PNA vac Low Risk Adult  Completed    Cancer Screenings:  Colorectal Screening: Completed 06/23/12. Repeat every 10 years. Pt states she does not plan on completing any additional colonoscopies.   Mammogram: Completed 03/27/17. Repeat every year. Scheduled or 07/15/2018  Bone Density: Completed 08/08/17. Results reflect NORMAL. Repeat every 2 years.   Lung Cancer Screening: (Low Dose CT Chest recommended if Age 65-80 years, 30 pack-year currently smoking OR have quit w/in 15years.) does not qualify.    Additional Screening:  Hepatitis C Screening: does qualify; Completed 02/11/15  Vision Screening: Recommended annual ophthalmology exams for early detection of glaucoma and other disorders of the eye. Is the patient up to date with their annual eye exam?  Yes  Who is the provider or what is the name of the office in which the pt attends annual eye exams? Dr. Edison Pace   Dental Screening: Recommended annual dental exams for proper oral hygiene  Community Resource Referral:  CRR required this visit?  No       Plan:     I have personally reviewed and addressed the Medicare Annual Wellness questionnaire and have noted the following in the patient's chart:  A. Medical and social history B. Use of alcohol, tobacco or illicit drugs  C. Current medications and supplements D.  Functional ability and status E.  Nutritional status F.  Physical activity G. Advance directives H. List of other physicians I.  Hospitalizations, surgeries, and ER visits in previous 12 months J.  [place] such as hearing and vision if needed, cognitive and depression L. Referrals and appointments   In addition, I have reviewed and discussed with patient certain  preventive protocols, quality metrics, and best practice recommendations. A written personalized care plan for preventive services as well as general preventive health recommendations were provided to patient.   Signed,  Clemetine Marker, LPN Nurse Health Advisor   Nurse Notes: pt doing well and appreciative of visit today. Scheduled for yearly exam next week with Dr. Army Melia

## 2018-07-03 NOTE — Patient Instructions (Signed)
Nancy Chen , Thank you for taking time to come for your Medicare Wellness Visit. I appreciate your ongoing commitment to your health goals. Please review the following plan we discussed and let me know if I can assist you in the future.   Screening recommendations/referrals: Colonoscopy: done 06/10/12. Repeat in 2024. Mammogram: done 03/27/17. Scheduled for 07/15/18.  Bone Density: done 08/08/17 Recommended yearly ophthalmology/optometry visit for glaucoma screening and checkup Recommended yearly dental visit for hygiene and checkup  Vaccinations: Influenza vaccine: done 10/10/17 Pneumococcal vaccine: done 06/26/16 Tdap vaccine: done 03/09/12 Shingles vaccine: series completed 09/25/17    Conditions/risks identified: Recommend drinking 6-8 glasses of water per day  Next appointment: Please follow up in one year for your Medicare Annual Wellness visit.     Preventive Care 27 Years and Older, Female Preventive care refers to lifestyle choices and visits with your health care provider that can promote health and wellness. What does preventive care include?  A yearly physical exam. This is also called an annual well check.  Dental exams once or twice a year.  Routine eye exams. Ask your health care provider how often you should have your eyes checked.  Personal lifestyle choices, including:  Daily care of your teeth and gums.  Regular physical activity.  Eating a healthy diet.  Avoiding tobacco and drug use.  Limiting alcohol use.  Practicing safe sex.  Taking low-dose aspirin every day.  Taking vitamin and mineral supplements as recommended by your health care provider. What happens during an annual well check? The services and screenings done by your health care provider during your annual well check will depend on your age, overall health, lifestyle risk factors, and family history of disease. Counseling  Your health care provider may ask you questions about your:  Alcohol  use.  Tobacco use.  Drug use.  Emotional well-being.  Home and relationship well-being.  Sexual activity.  Eating habits.  History of falls.  Memory and ability to understand (cognition).  Work and work Statistician.  Reproductive health. Screening  You may have the following tests or measurements:  Height, weight, and BMI.  Blood pressure.  Lipid and cholesterol levels. These may be checked every 5 years, or more frequently if you are over 64 years old.  Skin check.  Lung cancer screening. You may have this screening every year starting at age 33 if you have a 30-pack-year history of smoking and currently smoke or have quit within the past 15 years.  Fecal occult blood test (FOBT) of the stool. You may have this test every year starting at age 46.  Flexible sigmoidoscopy or colonoscopy. You may have a sigmoidoscopy every 5 years or a colonoscopy every 10 years starting at age 60.  Hepatitis C blood test.  Hepatitis B blood test.  Sexually transmitted disease (STD) testing.  Diabetes screening. This is done by checking your blood sugar (glucose) after you have not eaten for a while (fasting). You may have this done every 1-3 years.  Bone density scan. This is done to screen for osteoporosis. You may have this done starting at age 89.  Mammogram. This may be done every 1-2 years. Talk to your health care provider about how often you should have regular mammograms. Talk with your health care provider about your test results, treatment options, and if necessary, the need for more tests. Vaccines  Your health care provider may recommend certain vaccines, such as:  Influenza vaccine. This is recommended every year.  Tetanus, diphtheria, and  acellular pertussis (Tdap, Td) vaccine. You may need a Td booster every 10 years.  Zoster vaccine. You may need this after age 57.  Pneumococcal 13-valent conjugate (PCV13) vaccine. One dose is recommended after age 22.   Pneumococcal polysaccharide (PPSV23) vaccine. One dose is recommended after age 79. Talk to your health care provider about which screenings and vaccines you need and how often you need them. This information is not intended to replace advice given to you by your health care provider. Make sure you discuss any questions you have with your health care provider. Document Released: 01/22/2015 Document Revised: 09/15/2015 Document Reviewed: 10/27/2014 Elsevier Interactive Patient Education  2017 [place] Prevention in the Home Falls can cause injuries. They can happen to people of all ages. There are many things you can do to make your home safe and to help prevent falls. What can I do on the outside of my home?  Regularly fix the edges of walkways and driveways and fix any cracks.  Remove anything that might make you trip as you walk through a door, such as a raised step or threshold.  Trim any bushes or trees on the path to your home.  Use bright outdoor lighting.  Clear any walking paths of anything that might make someone trip, such as rocks or tools.  Regularly check to see if handrails are loose or broken. Make sure that both sides of any steps have handrails.  Any raised decks and porches should have guardrails on the edges.  Have any leaves, snow, or ice cleared regularly.  Use sand or salt on walking paths during winter.  Clean up any spills in your garage right away. This includes oil or grease spills. What can I do in the bathroom?  Use night lights.  Install grab bars by the toilet and in the tub and shower. Do not use towel bars as grab bars.  Use non-skid mats or decals in the tub or shower.  If you need to sit down in the shower, use a plastic, non-slip stool.  Keep the floor dry. Clean up any water that spills on the floor as soon as it happens.  Remove soap buildup in the tub or shower regularly.  Attach bath mats securely with double-sided non-slip  rug tape.  Do not have throw rugs and other things on the floor that can make you trip. What can I do in the bedroom?  Use night lights.  Make sure that you have a light by your bed that is easy to reach.  Do not use any sheets or blankets that are too big for your bed. They should not hang down onto the floor.  Have a firm chair that has side arms. You can use this for support while you get dressed.  Do not have throw rugs and other things on the floor that can make you trip. What can I do in the kitchen?  Clean up any spills right away.  Avoid walking on wet floors.  Keep items that you use a lot in easy-to-reach places.  If you need to reach something above you, use a strong step stool that has a grab bar.  Keep electrical cords out of the way.  Do not use floor polish or wax that makes floors slippery. If you must use wax, use non-skid floor wax.  Do not have throw rugs and other things on the floor that can make you trip. What can I do with my stairs?  Do not leave any items on the stairs.  Make sure that there are handrails on both sides of the stairs and use them. Fix handrails that are broken or loose. Make sure that handrails are as long as the stairways.  Check any carpeting to make sure that it is firmly attached to the stairs. Fix any carpet that is loose or worn.  Avoid having throw rugs at the top or bottom of the stairs. If you do have throw rugs, attach them to the floor with carpet tape.  Make sure that you have a light switch at the top of the stairs and the bottom of the stairs. If you do not have them, ask someone to add them for you. What else can I do to help prevent falls?  Wear shoes that:  Do not have high heels.  Have rubber bottoms.  Are comfortable and fit you well.  Are closed at the toe. Do not wear sandals.  If you use a stepladder:  Make sure that it is fully opened. Do not climb a closed stepladder.  Make sure that both sides of  the stepladder are locked into place.  Ask someone to hold it for you, if possible.  Clearly mark and make sure that you can see:  Any grab bars or handrails.  First and last steps.  Where the edge of each step is.  Use tools that help you move around (mobility aids) if they are needed. These include:  Canes.  Walkers.  Scooters.  Crutches.  Turn on the lights when you go into a dark area. Replace any light bulbs as soon as they burn out.  Set up your furniture so you have a clear path. Avoid moving your furniture around.  If any of your floors are uneven, fix them.  If there are any pets around you, be aware of where they are.  Review your medicines with your doctor. Some medicines can make you feel dizzy. This can increase your chance of falling. Ask your doctor what other things that you can do to help prevent falls. This information is not intended to replace advice given to you by your health care provider. Make sure you discuss any questions you have with your health care provider. Document Released: 10/22/2008 Document Revised: 06/03/2015 Document Reviewed: 01/30/2014 Elsevier Interactive Patient Education  2017 [employer].

## 2018-07-08 ENCOUNTER — Encounter: Payer: Self-pay | Admitting: Internal Medicine

## 2018-07-08 ENCOUNTER — Ambulatory Visit (INDEPENDENT_AMBULATORY_CARE_PROVIDER_SITE_OTHER): Payer: Medicare Other | Admitting: Internal Medicine

## 2018-07-08 ENCOUNTER — Other Ambulatory Visit: Payer: Self-pay

## 2018-07-08 VITALS — BP 126/82 | HR 95 | Ht 63.0 in | Wt 236.0 lb

## 2018-07-08 DIAGNOSIS — E1169 Type 2 diabetes mellitus with other specified complication: Secondary | ICD-10-CM

## 2018-07-08 DIAGNOSIS — I10 Essential (primary) hypertension: Secondary | ICD-10-CM | POA: Diagnosis not present

## 2018-07-08 DIAGNOSIS — M170 Bilateral primary osteoarthritis of knee: Secondary | ICD-10-CM

## 2018-07-08 DIAGNOSIS — E785 Hyperlipidemia, unspecified: Secondary | ICD-10-CM | POA: Diagnosis not present

## 2018-07-08 DIAGNOSIS — E118 Type 2 diabetes mellitus with unspecified complications: Secondary | ICD-10-CM | POA: Diagnosis not present

## 2018-07-08 DIAGNOSIS — G43009 Migraine without aura, not intractable, without status migrainosus: Secondary | ICD-10-CM | POA: Diagnosis not present

## 2018-07-08 DIAGNOSIS — Z6841 Body Mass Index (BMI) 40.0 and over, adult: Secondary | ICD-10-CM

## 2018-07-08 LAB — POCT URINALYSIS DIPSTICK
Bilirubin, UA: NEGATIVE
Blood, UA: NEGATIVE
Glucose, UA: NEGATIVE
Ketones, UA: NEGATIVE
Leukocytes, UA: NEGATIVE
Nitrite, UA: NEGATIVE
Protein, UA: NEGATIVE
Spec Grav, UA: 1.02 (ref 1.010–1.025)
Urobilinogen, UA: 0.2 E.U./dL
pH, UA: 6 (ref 5.0–8.0)

## 2018-07-08 NOTE — Progress Notes (Signed)
Date:  07/08/2018   Name:  Nancy Chen   DOB:  09-13-1945   MRN:  [id number]   Chief Complaint: Annual Exam (Breast Exam. No pap.) Nancy Chen is a 73 y.o. female who presents today for her annual exam.  She feels fairly well. She reports exercising doing yard work. She reports she is sleeping fairly well. She denies breast problems.  Mammogram scheduled for July Colonoscopy 2014 DM eye exam due now Immunizations are up to date DEXA 07/2017  Diabetes She presents for her follow-up diabetic visit. She has type 2 diabetes mellitus. Hypoglycemia symptoms include headaches. Pertinent negatives for hypoglycemia include no dizziness, nervousness/anxiousness or tremors. Pertinent negatives for diabetes include no chest pain, no fatigue, no polydipsia and no polyuria. Her weight is stable. She monitors blood glucose at home 3-4 x per week. Blood glucose monitoring compliance is good. Her breakfast blood glucose is taken between 7-8 am. Her breakfast blood glucose range is generally 140-180 mg/dl. An ACE inhibitor/angiotensin II receptor blocker is being taken. Eye exam current: is scheduled.  Hypertension This is a chronic problem. The problem is controlled. Associated symptoms include headaches. Pertinent negatives include no chest pain, palpitations or shortness of breath. Past treatments include ACE inhibitors and diuretics.  Hyperlipidemia The problem is controlled. Pertinent negatives include no chest pain or shortness of breath. Current antihyperlipidemic treatment includes statins. The current treatment provides significant improvement of lipids.  Migraine  This is a recurrent problem. Episode frequency: about 3 per month. The quality of the pain is described as shooting. Pertinent negatives include no abdominal pain, coughing, dizziness, fever, hearing loss, photophobia, tinnitus or vomiting. She has tried Excedrin for the symptoms. The treatment provided significant relief. Her past  medical history is significant for hypertension.  Knee Pain  The pain is present in the right knee. The quality of the pain is described as shooting (started after doing excess yard work). Exacerbated by: chronic severe OA both knees.   Lab Results  Component Value Date   HGBA1C 8.1 (H) 03/13/2018   Lab Results  Component Value Date   CREATININE 0.69 03/13/2018   BUN 12 03/13/2018   NA 143 03/13/2018   K 4.2 03/13/2018   CL 98 03/13/2018   CO2 22 03/13/2018   Lab Results  Component Value Date   CHOL 180 07/05/2017   HDL 50 07/05/2017   LDLCALC 90 07/05/2017   TRIG 202 (H) 07/05/2017   CHOLHDL 3.6 07/05/2017     Review of Systems  Constitutional: Negative for chills, fatigue and fever.  HENT: Negative for congestion, hearing loss, tinnitus, trouble swallowing and voice change.   Eyes: Negative for photophobia and visual disturbance.  Respiratory: Negative for cough, chest tightness, shortness of breath and wheezing.   Cardiovascular: Negative for chest pain, palpitations and leg swelling.  Gastrointestinal: Negative for abdominal pain, constipation, diarrhea and vomiting.  Endocrine: Negative for polydipsia and polyuria.  Genitourinary: Negative for dysuria, frequency, genital sores, vaginal bleeding and vaginal discharge.  Musculoskeletal: Positive for arthralgias (right knee strain) and gait problem. Negative for joint swelling.  Skin: Negative for color change and rash.  Allergic/Immunologic: Negative for environmental allergies.  Neurological: Positive for headaches. Negative for dizziness, tremors and light-headedness.  Hematological: Negative for adenopathy. Does not bruise/bleed easily.  Psychiatric/Behavioral: Negative for dysphoric mood and sleep disturbance. The patient is not nervous/anxious.     Patient Active Problem List   Diagnosis Date Noted  . Strain of right shoulder 07/05/2017  .  Facial basal cell cancer 10/31/2016  . Migraine 10/31/2016  . Sleep  disorder 02/04/2016  . Lipoma of left upper extremity 10/11/2015  . Type II diabetes mellitus with complication ([place]) 37/90/2409  . Ovarian failure 11/10/2014  . Long-term use of high-risk medication 11/10/2014  . Essential (primary) hypertension 07/15/2014  . Hyperlipidemia associated with type 2 diabetes mellitus ([place]) 07/15/2014  . Dermatophytosis of groin 07/15/2014  . Tricompartment osteoarthritis of knee 07/15/2014    Allergies  Allergen Reactions  . Glipizide Swelling    hives  . Codeine Hives    Past Surgical History:  Procedure Laterality Date  . ABDOMINAL HYSTERECTOMY    . BASAL CELL CARCINOMA EXCISION    . CARPAL TUNNEL RELEASE Bilateral   . CARPAL TUNNEL RELEASE    . CATARACT EXTRACTION W/PHACO Left 09/07/2016   Procedure: CATARACT EXTRACTION PHACO AND INTRAOCULAR LENS PLACEMENT (IOC);  Surgeon: Eulogio Bear, MD;  Location: [facility] ORS;  Service: Ophthalmology;  Laterality: Left;  [place] 00:41.9 AP% 14.9 CDE 6.23 Fluid pack lot # [id number]  . CATARACT EXTRACTION W/PHACO Right 10/19/2016   Procedure: CATARACT EXTRACTION PHACO AND INTRAOCULAR LENS PLACEMENT ([place]);  Surgeon: Eulogio Bear, MD;  Location: [facility] ORS;  Service: Ophthalmology;  Laterality: Right;  casette lot # [id number] H [place]  00:59.5 AP%   16.5 CDE   9.79   . COLONOSCOPY  06/2012   normal (in [place])  . TONSILLECTOMY      Social History   Tobacco Use  . Smoking status: Never Smoker  . Smokeless tobacco: Never Used  . Tobacco comment: smoking cessation materials not required  Substance Use Topics  . Alcohol use: Not Currently  . Drug use: No     Medication list has been reviewed and updated.  Current Meds  Medication Sig  . Acetaminophen (TYLENOL ARTHRITIS PAIN PO) Take 1 capsule by mouth 2 (two) times daily.  Marland Kitchen albuterol (PROVENTIL HFA;VENTOLIN HFA) 108 (90 Base) MCG/ACT inhaler Inhale 2 puffs into the lungs every 6 (six) hours as needed for wheezing or shortness of breath.  Marland Kitchen albuterol  (PROVENTIL) (2.5 MG/3ML) 0.083% nebulizer solution Take 3 mLs (2.5 mg total) by nebulization every 6 (six) hours as needed for wheezing or shortness of breath.  . cetirizine (ZYRTEC) 10 MG tablet Take 10 mg by mouth as needed.   . cyanocobalamin 1000 MCG tablet Take 1 tablet by mouth daily.  Marland Kitchen glucose blood (ACCU-CHEK AVIVA) test strip   . JANUVIA 100 MG tablet TAKE 1 TABLET EVERY DAY  . lisinopril-hydrochlorothiazide (PRINZIDE,ZESTORETIC) 20-12.5 MG tablet TAKE 1 TABLET EVERY DAY  . metFORMIN (GLUCOPHAGE) 1000 MG tablet TAKE 1 TABLET BY MOUTH TWICE A DAY  . Multiple Vitamins-Minerals (MULTIVITAMIN WITH MINERALS) tablet Take 1 tablet by mouth daily.  . naproxen sodium (ANAPROX) 220 MG tablet Take 220 mg by mouth 2 (two) times daily with a meal. PRN  . Omega 3-6-9 Fatty Acids (OMEGA 3-6-9 COMPLEX PO) Take 1,600 mg by mouth daily.  . Pyridoxine HCl (VITAMIN B6) 200 MG TABS Take 1 tablet by mouth 2 (two) times daily.   . simvastatin (ZOCOR) 20 MG tablet TAKE 1 TABLET EVERY DAY  . traZODone (DESYREL) 50 MG tablet Take 0.5 tablets (25 mg total) by mouth at bedtime as needed for sleep. (Patient taking differently: Take 25 mg by mouth at bedtime as needed for sleep. Pt takes very rarely)  . [DISCONTINUED] budesonide-formoterol (SYMBICORT) 160-4.5 MCG/ACT inhaler Inhale 2 puffs into the lungs 2 (two) times daily. (Patient taking differently: Inhale 2  puffs into the lungs 2 (two) times daily as needed. )   Current Facility-Administered Medications for the 07/08/18 encounter (Office Visit) with Glean Hess, MD  Medication  . albuterol (PROVENTIL) (2.5 MG/3ML) 0.083% nebulizer solution 2.5 mg    PHQ 2/9 Scores 07/08/2018 07/03/2018 03/13/2018 07/05/2017  PHQ - 2 Score 0 0 0 0  PHQ- 9 Score 0 - - 0    BP Readings from Last 3 Encounters:  07/08/18 126/82  03/13/18 124/70  11/06/17 132/78    Physical Exam Vitals signs and nursing note reviewed.  Constitutional:      General: She is not in acute  distress.    Appearance: She is well-developed. She is obese.  HENT:     Head: Normocephalic and atraumatic.     Right Ear: Tympanic membrane and ear canal normal.     Left Ear: Tympanic membrane and ear canal normal.     Nose:     Right Sinus: No maxillary sinus tenderness.     Left Sinus: No maxillary sinus tenderness.     Mouth/Throat:     Pharynx: Uvula midline.  Eyes:     General: No scleral icterus.       Right eye: No discharge.        Left eye: No discharge.     Conjunctiva/sclera: Conjunctivae normal.  Neck:     Musculoskeletal: Normal range of motion. No erythema.     Thyroid: No thyromegaly.     Vascular: No carotid bruit.  Cardiovascular:     Rate and Rhythm: Normal rate and regular rhythm.  No extrasystoles are present.    Pulses: Normal pulses.          Dorsalis pedis pulses are 2+ on the right side and 2+ on the left side.       Posterior tibial pulses are 2+ on the right side and 2+ on the left side.     Heart sounds: Normal heart sounds. No murmur.  Pulmonary:     Effort: Pulmonary effort is normal. No respiratory distress.     Breath sounds: No wheezing.  Chest:     Breasts:        Right: No mass, nipple discharge, skin change or tenderness.        Left: No mass, nipple discharge, skin change or tenderness.  Abdominal:     General: Bowel sounds are normal.     Palpations: Abdomen is soft.     Tenderness: There is no abdominal tenderness.  Musculoskeletal:        General: Tenderness present.     Right knee: Tenderness found. Lateral joint line tenderness noted.     Right lower leg: No edema.     Left lower leg: No edema.  Lymphadenopathy:     Cervical: No cervical adenopathy.  Skin:    General: Skin is warm and dry.     Capillary Refill: Capillary refill takes less than 2 seconds.     Findings: No rash.  Neurological:     General: No focal deficit present.     Mental Status: She is alert and oriented to person, place, and time.     Cranial Nerves:  No cranial nerve deficit.     Sensory: No sensory deficit.     Deep Tendon Reflexes: Reflexes are normal and symmetric.  Psychiatric:        Attention and Perception: Attention normal.        Mood and Affect: Mood normal.  Speech: Speech normal.        Behavior: Behavior normal.        Thought Content: Thought content normal.     Wt Readings from Last 3 Encounters:  07/08/18 236 lb (107 kg)  07/03/18 238 lb 3.2 oz (108 kg)  03/13/18 240 lb (108.9 kg)    BP 126/82   Pulse 95   Ht 5\' 3"  (1.6 m)   Wt 236 lb (107 kg)   SpO2 95%   BMI 41.81 kg/m   Assessment and Plan: 1. Essential (primary) hypertension Controlled, continue current therapy - CBC with Differential/Platelet - POCT urinalysis dipstick  2. Type II diabetes mellitus with complication (HCC) Continue current medications If not worse, will add Trulicity - demo done with patient Schedule DM eye exam - Comprehensive metabolic panel - Hemoglobin A1c - TSH  3. Hyperlipidemia associated with type 2 diabetes mellitus ([place]) On statin medication - Lipid panel  4. Tricompartment osteoarthritis of both knees Continue tendonitis strap and walker If not improving over the next few weeks, consider Ortho evaluation  5. Migraine without aura and without status migrainosus, not intractable Responds well to Excedrin migraine  6. BMI 40.0-44.9, adult ([place]) Continue to work on diet, exercise as able   Partially dictated using Editor, commissioning. Any errors are unintentional.  Halina Maidens, MD [facility]  07/08/2018

## 2018-07-09 ENCOUNTER — Other Ambulatory Visit: Payer: Self-pay | Admitting: Internal Medicine

## 2018-07-09 DIAGNOSIS — E118 Type 2 diabetes mellitus with unspecified complications: Secondary | ICD-10-CM

## 2018-07-09 LAB — CBC WITH DIFFERENTIAL/PLATELET
Basophils Absolute: 0 10*3/uL (ref 0.0–0.2)
Basos: 1 %
EOS (ABSOLUTE): 0.2 10*3/uL (ref 0.0–0.4)
Eos: 4 %
Hematocrit: 42.5 % (ref 34.0–46.6)
Hemoglobin: 13.9 g/dL (ref 11.1–15.9)
Immature Grans (Abs): 0 10*3/uL (ref 0.0–0.1)
Immature Granulocytes: 0 %
Lymphocytes Absolute: 1.8 10*3/uL (ref 0.7–3.1)
Lymphs: 37 %
MCH: 29.7 pg (ref 26.6–33.0)
MCHC: 32.7 g/dL (ref 31.5–35.7)
MCV: 91 fL (ref 79–97)
Monocytes Absolute: 0.5 10*3/uL (ref 0.1–0.9)
Monocytes: 9 %
Neutrophils Absolute: 2.4 10*3/uL (ref 1.4–7.0)
Neutrophils: 49 %
Platelets: 225 10*3/uL (ref 150–450)
RBC: 4.68 x10E6/uL (ref 3.77–5.28)
RDW: 12.9 % (ref 11.7–15.4)
WBC: 4.9 10*3/uL (ref 3.4–10.8)

## 2018-07-09 LAB — COMPREHENSIVE METABOLIC PANEL
ALT: 44 IU/L — ABNORMAL HIGH (ref 0–32)
AST: 36 IU/L (ref 0–40)
Albumin/Globulin Ratio: 2.6 — ABNORMAL HIGH (ref 1.2–2.2)
Albumin: 5 g/dL — ABNORMAL HIGH (ref 3.7–4.7)
Alkaline Phosphatase: 51 IU/L (ref 39–117)
BUN/Creatinine Ratio: 24 (ref 12–28)
BUN: 17 mg/dL (ref 8–27)
Bilirubin Total: 0.4 mg/dL (ref 0.0–1.2)
CO2: 23 mmol/L (ref 20–29)
Calcium: 10.1 mg/dL (ref 8.7–10.3)
Chloride: 98 mmol/L (ref 96–106)
Creatinine, Ser: 0.7 mg/dL (ref 0.57–1.00)
GFR calc Af Amer: 101 mL/min/{1.73_m2} (ref >59)
GFR calc non Af Amer: 87 mL/min/{1.73_m2} (ref >59)
Globulin, Total: 1.9 g/dL (ref 1.5–4.5)
Glucose: 207 mg/dL — ABNORMAL HIGH (ref 65–99)
Potassium: 4.5 mmol/L (ref 3.5–5.2)
Sodium: 141 mmol/L (ref 134–144)
Total Protein: 6.9 g/dL (ref 6.0–8.5)

## 2018-07-09 LAB — LIPID PANEL
Chol/HDL Ratio: 3.7 ratio (ref 0.0–4.4)
Cholesterol, Total: 176 mg/dL (ref 100–199)
HDL: 47 mg/dL (ref >39)
LDL Calculated: 83 mg/dL (ref 0–99)
Triglycerides: 230 mg/dL — ABNORMAL HIGH (ref 0–149)
VLDL Cholesterol Cal: 46 mg/dL — ABNORMAL HIGH (ref 5–40)

## 2018-07-09 LAB — HEMOGLOBIN A1C
Est. average glucose Bld gHb Est-mCnc: 194 mg/dL
Hgb A1c MFr Bld: 8.4 % — ABNORMAL HIGH (ref 4.8–5.6)

## 2018-07-09 LAB — TSH: TSH: 1.07 u[IU]/mL (ref 0.450–4.500)

## 2018-07-09 MED ORDER — TRULICITY 0.75 MG/0.5ML ~~LOC~~ SOAJ: 0.7500 mg | SUBCUTANEOUS | 0 refills | Status: DC

## 2018-07-15 ENCOUNTER — Ambulatory Visit
Admission: RE | Admit: 2018-07-15 | Discharge: 2018-07-15 | Disposition: A | Discharge status: Home / Self Care | Payer: Medicare Other | Source: Ambulatory Visit | Attending: Internal Medicine | Admitting: Internal Medicine

## 2018-07-15 ENCOUNTER — Other Ambulatory Visit: Payer: Self-pay

## 2018-07-15 DIAGNOSIS — Z1231 Encounter for screening mammogram for malignant neoplasm of breast: Secondary | ICD-10-CM | POA: Diagnosis not present

## 2018-08-30 ENCOUNTER — Telehealth: Payer: Self-pay

## 2018-08-30 NOTE — Telephone Encounter (Signed)
Completed PA on patient medication for Januvia 100 mg once daily.  [facility] - PA Case ID: [id number] - Rx #: [id number]  Awaiting fax for outcome of PA from [facility].

## 2018-08-30 NOTE — Telephone Encounter (Signed)
Received fax from patient insurance. PA has been APPROVED until 01/09/2019.

## 2018-09-02 ENCOUNTER — Other Ambulatory Visit: Payer: Self-pay

## 2018-09-02 ENCOUNTER — Telehealth: Payer: Self-pay

## 2018-09-02 DIAGNOSIS — E118 Type 2 diabetes mellitus with unspecified complications: Secondary | ICD-10-CM

## 2018-09-02 NOTE — Telephone Encounter (Signed)
Patient called asking for refill of Trulicity and asked if she will remain on Metformin and Januvia ? She is at 5 vials of Trulicity and Mcarthur Rossetti is calling her reminding to refill so she will need refill if deciding to remain on it. Has changed diet and is getting BS readings 125-150. F/U is not until Nov.

## 2018-09-02 NOTE — Telephone Encounter (Signed)
Yes, she is to continue all three until her next appointment.

## 2018-09-02 NOTE — Telephone Encounter (Signed)
Please advise. I just did the PA on Januvia and got the medication approved. I just want to be sure that we said patient needs to continue all three medications. If so, ill send in her RF.  Thank you.

## 2018-09-02 NOTE — Telephone Encounter (Signed)
Called and spoke with patient informed of this. She will continue all three medications and call when running low for a refill. She is no wanting it to be refilled at this time.

## 2018-09-20 ENCOUNTER — Other Ambulatory Visit: Payer: Self-pay | Admitting: Internal Medicine

## 2018-09-20 DIAGNOSIS — E118 Type 2 diabetes mellitus with unspecified complications: Secondary | ICD-10-CM

## 2018-10-01 DIAGNOSIS — E119 Type 2 diabetes mellitus without complications: Secondary | ICD-10-CM | POA: Diagnosis not present

## 2018-10-01 LAB — HM DIABETES EYE EXAM

## 2018-10-11 ENCOUNTER — Encounter: Payer: Self-pay | Admitting: Internal Medicine

## 2018-10-11 DIAGNOSIS — Z23 Encounter for immunization: Secondary | ICD-10-CM | POA: Diagnosis not present

## 2018-11-12 ENCOUNTER — Other Ambulatory Visit: Payer: Self-pay

## 2018-11-12 ENCOUNTER — Ambulatory Visit (INDEPENDENT_AMBULATORY_CARE_PROVIDER_SITE_OTHER): Payer: Medicare Other | Admitting: Internal Medicine

## 2018-11-12 ENCOUNTER — Encounter: Payer: Self-pay | Admitting: Internal Medicine

## 2018-11-12 VITALS — BP 116/64 | HR 92 | Ht 63.0 in | Wt 222.0 lb

## 2018-11-12 DIAGNOSIS — M170 Bilateral primary osteoarthritis of knee: Secondary | ICD-10-CM

## 2018-11-12 DIAGNOSIS — I10 Essential (primary) hypertension: Secondary | ICD-10-CM | POA: Diagnosis not present

## 2018-11-12 DIAGNOSIS — E118 Type 2 diabetes mellitus with unspecified complications: Secondary | ICD-10-CM | POA: Diagnosis not present

## 2018-11-12 NOTE — Patient Instructions (Signed)
After your labs come back - I may very well stop Januvia and increase the dose of Trulicity.

## 2018-11-12 NOTE — Progress Notes (Signed)
Date:  11/12/2018   Name:  Nancy Chen   DOB:  May 31, 1945   MRN:  [id number]   Chief Complaint: Diabetes (A1C.)  Diabetes She presents for her follow-up diabetic visit. She has type 2 diabetes mellitus. Her disease course has been improving. Pertinent negatives for hypoglycemia include no headaches or tremors. Pertinent negatives for diabetes include no chest pain, no fatigue, no polydipsia and no polyuria. Current diabetic treatment includes oral agent (dual therapy) (metformin, januvia; Trulicity added last visit). She is compliant with treatment all of the time. An ACE inhibitor/angiotensin II receptor blocker is being taken.  Hypertension This is a chronic problem. The problem is controlled. Pertinent negatives include no chest pain, headaches, palpitations or shortness of breath. Past treatments include ACE inhibitors and diuretics. The current treatment provides significant improvement.   Lab Results  Component Value Date   HGBA1C 8.4 (H) 07/08/2018   Lab Results  Component Value Date   CREATININE 0.70 07/08/2018   BUN 17 07/08/2018   NA 141 07/08/2018   K 4.5 07/08/2018   CL 98 07/08/2018   CO2 23 07/08/2018     Review of Systems  Constitutional: Negative for appetite change, fatigue, fever and unexpected weight change.  HENT: Negative for tinnitus and trouble swallowing.   Eyes: Negative for visual disturbance.  Respiratory: Negative for cough, chest tightness and shortness of breath.   Cardiovascular: Negative for chest pain, palpitations and leg swelling.  Gastrointestinal: Negative for abdominal pain.  Endocrine: Negative for polydipsia and polyuria.  Genitourinary: Negative for dysuria and hematuria.  Musculoskeletal: Negative for arthralgias.  Neurological: Negative for tremors, numbness and headaches.  Psychiatric/Behavioral: Negative for dysphoric mood.    Patient Active Problem List   Diagnosis Date Noted  . Strain of right shoulder 07/05/2017  .  Facial basal cell cancer 10/31/2016  . Migraine 10/31/2016  . Sleep disorder 02/04/2016  . Lipoma of left upper extremity 10/11/2015  . Type II diabetes mellitus with complication ([place]) 123XX123  . Ovarian failure 11/10/2014  . Long-term use of high-risk medication 11/10/2014  . Essential (primary) hypertension 07/15/2014  . Hyperlipidemia associated with type 2 diabetes mellitus ([place]) 07/15/2014  . Dermatophytosis of groin 07/15/2014  . Tricompartment osteoarthritis of knee 07/15/2014    Allergies  Allergen Reactions  . Glipizide Swelling    hives  . Codeine Hives    Past Surgical History:  Procedure Laterality Date  . ABDOMINAL HYSTERECTOMY    . BASAL CELL CARCINOMA EXCISION    . CARPAL TUNNEL RELEASE Bilateral   . CARPAL TUNNEL RELEASE    . CATARACT EXTRACTION W/PHACO Left 09/07/2016   Procedure: CATARACT EXTRACTION PHACO AND INTRAOCULAR LENS PLACEMENT (IOC);  Surgeon: Eulogio Bear, MD;  Location: [facility] ORS;  Service: Ophthalmology;  Laterality: Left;  [place] 00:41.9 AP% 14.9 CDE 6.23 Fluid pack lot # [id number]  . CATARACT EXTRACTION W/PHACO Right 10/19/2016   Procedure: CATARACT EXTRACTION PHACO AND INTRAOCULAR LENS PLACEMENT ([place]);  Surgeon: Eulogio Bear, MD;  Location: [facility] ORS;  Service: Ophthalmology;  Laterality: Right;  casette lot # [id number] H [place]  00:59.5 AP%   16.5 CDE   9.79   . COLONOSCOPY  06/2012   normal (in [place])  . TONSILLECTOMY      Social History   Tobacco Use  . Smoking status: Never Smoker  . Smokeless tobacco: Never Used  . Tobacco comment: smoking cessation materials not required  Substance Use Topics  . Alcohol use: Not Currently  . Drug use: No  Medication list has been reviewed and updated.  Current Meds  Medication Sig  . Acetaminophen (TYLENOL ARTHRITIS PAIN PO) Take 1 capsule by mouth 2 (two) times daily.  Marland Kitchen albuterol (PROVENTIL HFA;VENTOLIN HFA) 108 (90 Base) MCG/ACT inhaler Inhale 2 puffs into the lungs every 6 (six)  hours as needed for wheezing or shortness of breath.  Marland Kitchen albuterol (PROVENTIL) (2.5 MG/3ML) 0.083% nebulizer solution Take 3 mLs (2.5 mg total) by nebulization every 6 (six) hours as needed for wheezing or shortness of breath.  . cetirizine (ZYRTEC) 10 MG tablet Take 10 mg by mouth as needed.   . cyanocobalamin 1000 MCG tablet Take 1 tablet by mouth daily.  Marland Kitchen glucose blood (ACCU-CHEK AVIVA) test strip   . JANUVIA 100 MG tablet TAKE 1 TABLET EVERY DAY  . lisinopril-hydrochlorothiazide (PRINZIDE,ZESTORETIC) 20-12.5 MG tablet TAKE 1 TABLET EVERY DAY  . metFORMIN (GLUCOPHAGE) 1000 MG tablet TAKE 1 TABLET BY MOUTH TWICE A DAY  . Multiple Vitamins-Minerals (MULTIVITAMIN WITH MINERALS) tablet Take 1 tablet by mouth daily.  . naproxen sodium (ANAPROX) 220 MG tablet Take 220 mg by mouth 2 (two) times daily with a meal. PRN  . Omega 3-6-9 Fatty Acids (OMEGA 3-6-9 COMPLEX PO) Take 1,600 mg by mouth daily.  . Pyridoxine HCl (VITAMIN B6) 200 MG TABS Take 1 tablet by mouth 2 (two) times daily.   . simvastatin (ZOCOR) 20 MG tablet TAKE 1 TABLET EVERY DAY  . traZODone (DESYREL) 50 MG tablet Take 0.5 tablets (25 mg total) by mouth at bedtime as needed for sleep. (Patient taking differently: Take 25 mg by mouth at bedtime as needed for sleep. Pt takes very rarely)  . TRULICITY [id number] 0000000 SOPN INJECT  0.75MG   INTO  THE  SKIN  ONCE  A  WEEK   Current Facility-Administered Medications for the 11/12/18 encounter (Office Visit) with Glean Hess, MD  Medication  . albuterol (PROVENTIL) (2.5 MG/3ML) 0.083% nebulizer solution 2.5 mg    PHQ 2/9 Scores 11/12/2018 07/08/2018 07/03/2018 03/13/2018  PHQ - 2 Score 0 0 0 0  PHQ- 9 Score 3 0 - -    BP Readings from Last 3 Encounters:  11/12/18 116/64  07/08/18 126/82  03/13/18 124/70    Physical Exam Vitals signs and nursing note reviewed.  Constitutional:      General: She is not in acute distress.    Appearance: She is well-developed.  HENT:     Head:  Normocephalic and atraumatic.  Cardiovascular:     Rate and Rhythm: Normal rate and regular rhythm.     Heart sounds: No murmur.  Pulmonary:     Effort: Pulmonary effort is normal. No respiratory distress.  Musculoskeletal:     Right lower leg: No edema.     Left lower leg: No edema.       Legs:  Skin:    General: Skin is warm and dry.     Capillary Refill: Capillary refill takes less than 2 seconds.     Findings: No rash.  Neurological:     General: No focal deficit present.     Mental Status: She is alert and oriented to person, place, and time.  Psychiatric:        Behavior: Behavior normal.        Thought Content: Thought content normal.     Wt Readings from Last 3 Encounters:  11/12/18 222 lb (100.7 kg)  07/08/18 236 lb (107 kg)  07/03/18 238 lb 3.2 oz (108 kg)    BP  116/64   Pulse 92   Ht 5\' 3"  (1.6 m)   Wt 222 lb (100.7 kg)   SpO2 98%   BMI 39.33 kg/m   Assessment and Plan: 1. Type II diabetes mellitus with complication (HCC) Clinically stable by exam and report without s/s of hypoglycemia. She has lost 14 lbs with diet change and addition of Trulicity A999333 mg. DM complicated by HTN. Tolerating medications metformin 1000 mg bid and Januvia 100 mg well without side effects or other concerns. If A1C is much better, will stop Januvia and increase Trulicity - Basic metabolic panel - Hemoglobin A1c  2. Essential (primary) hypertension Clinically stable exam with well controlled BP.   Tolerating medications, lisinopril hct 20/12.5 mg, without side effects at this time. Pt to continue current regimen and low sodium diet; benefits of regular exercise as able discussed.   3. Tricompartment osteoarthritis of both knees Pt has not had surgery due to weight and A1C elevations She is doing well at this time with weight loss but has decided that she will not pursue surgery at this time She continues to have pain from a left sided Baker's cyst and wears a soft brace  to reduce discomfort   Partially dictated using Editor, commissioning. Any errors are unintentional.  Halina Maidens, MD [facility]  11/12/2018

## 2018-11-13 ENCOUNTER — Other Ambulatory Visit: Payer: Self-pay | Admitting: Internal Medicine

## 2018-11-13 DIAGNOSIS — E118 Type 2 diabetes mellitus with unspecified complications: Secondary | ICD-10-CM

## 2018-11-13 LAB — BASIC METABOLIC PANEL
BUN/Creatinine Ratio: 24 (ref 12–28)
BUN: 18 mg/dL (ref 8–27)
CO2: 24 mmol/L (ref 20–29)
Calcium: 11.1 mg/dL — ABNORMAL HIGH (ref 8.7–10.3)
Chloride: 99 mmol/L (ref 96–106)
Creatinine, Ser: 0.74 mg/dL (ref 0.57–1.00)
GFR calc Af Amer: 94 mL/min/{1.73_m2} (ref >59)
GFR calc non Af Amer: 82 mL/min/{1.73_m2} (ref >59)
Glucose: 131 mg/dL — ABNORMAL HIGH (ref 65–99)
Potassium: 4.7 mmol/L (ref 3.5–5.2)
Sodium: 140 mmol/L (ref 134–144)

## 2018-11-13 LAB — HEMOGLOBIN A1C
Est. average glucose Bld gHb Est-mCnc: 151 mg/dL
Hgb A1c MFr Bld: 6.9 % — ABNORMAL HIGH (ref 4.8–5.6)

## 2018-11-13 MED ORDER — TRULICITY 1.5 MG/0.5ML ~~LOC~~ SOAJ: 1.5000 mg | SUBCUTANEOUS | 3 refills | Status: DC

## 2019-03-04 ENCOUNTER — Other Ambulatory Visit: Payer: Self-pay

## 2019-03-04 DIAGNOSIS — E118 Type 2 diabetes mellitus with unspecified complications: Secondary | ICD-10-CM

## 2019-03-04 DIAGNOSIS — F5102 Adjustment insomnia: Secondary | ICD-10-CM

## 2019-03-04 DIAGNOSIS — E119 Type 2 diabetes mellitus without complications: Secondary | ICD-10-CM

## 2019-03-04 MED ORDER — METFORMIN HCL 1000 MG PO TABS: 1000.0000 mg | ORAL_TABLET | Freq: Two times a day (BID) | ORAL | 1 refills | Status: DC

## 2019-03-04 MED ORDER — TRULICITY 1.5 MG/0.5ML ~~LOC~~ SOAJ: 1.5000 mg | SUBCUTANEOUS | 3 refills | Status: DC

## 2019-03-04 MED ORDER — SIMVASTATIN 20 MG PO TABS: 20.0000 mg | ORAL_TABLET | Freq: Every day | ORAL | 3 refills | Status: DC

## 2019-03-04 MED ORDER — SITAGLIPTIN PHOSPHATE 100 MG PO TABS: 100.0000 mg | ORAL_TABLET | Freq: Every day | ORAL | 3 refills | Status: DC

## 2019-03-04 MED ORDER — LISINOPRIL-HYDROCHLOROTHIAZIDE 20-12.5 MG PO TABS: 1.0000 | ORAL_TABLET | Freq: Every day | ORAL | 3 refills | Status: DC

## 2019-03-12 ENCOUNTER — Encounter: Payer: Self-pay | Admitting: Internal Medicine

## 2019-03-12 ENCOUNTER — Other Ambulatory Visit: Payer: Self-pay

## 2019-03-12 ENCOUNTER — Ambulatory Visit (INDEPENDENT_AMBULATORY_CARE_PROVIDER_SITE_OTHER): Payer: Medicare Other | Admitting: Internal Medicine

## 2019-03-12 VITALS — BP 130/68 | HR 82 | Temp 95.7°F | Ht 63.0 in | Wt 226.0 lb

## 2019-03-12 DIAGNOSIS — Z6841 Body Mass Index (BMI) 40.0 and over, adult: Secondary | ICD-10-CM

## 2019-03-12 DIAGNOSIS — S7001XA Contusion of right hip, initial encounter: Secondary | ICD-10-CM | POA: Diagnosis not present

## 2019-03-12 DIAGNOSIS — E118 Type 2 diabetes mellitus with unspecified complications: Secondary | ICD-10-CM

## 2019-03-12 NOTE — Progress Notes (Signed)
Date:  03/12/2019   Name:  Nancy Chen   DOB:  1945-08-15   MRN:  [id number]   Chief Complaint: Diabetes (4 month follow up. Pt wondering of [place] is causing her to be more off balance since increasing this to 1.5 weekly. [place] twice since December. Diarrhea on and off. Takes the shot Wednesday nights. ) and Hip Pain (Right hip pain since falling in December. And right shoulder is hurting from another fall. )  Diabetes She presents for her follow-up diabetic visit. She has type 2 diabetes mellitus. Her disease course has been improving. There are no hypoglycemic associated symptoms. Pertinent negatives for hypoglycemia include no dizziness or headaches. (But feels like her balance is worse in general. ) Pertinent negatives for diabetes include no blurred vision, no chest pain, no fatigue, no foot paresthesias, no polydipsia, no polyphagia and no weakness. There are no hypoglycemic complications. Symptoms are stable. Current diabetic treatment includes oral agent (monotherapy) (and trulicity). She is compliant with treatment all of the time (but more GI side effects lasting a day on the higher dose of trulicity). Her weight is stable. She monitors blood glucose at home 1-2 x per day. Her breakfast blood glucose is taken between 6-7 am. Her breakfast blood glucose range is generally 130-140 mg/dl. An ACE inhibitor/angiotensin II receptor blocker is being taken.  Hip Pain  The incident occurred in the street. The injury mechanism was a fall. The pain is present in the right hip. The quality of the pain is described as aching. The pain is mild (worse at night). The pain has been fluctuating since onset. Pertinent negatives include no inability to bear weight, loss of sensation or muscle weakness. She has tried rest, heat and acetaminophen for the symptoms. The treatment provided mild relief.   Immunization History  Administered Date(s) Administered  . Hepatitis B 07/04/2011  . Hepatitis B, adult  07/04/2011  . Influenza, High Dose Seasonal PF 10/12/2016, 10/10/2017  . Influenza,inj,Quad PF,6+ Mos 09/29/2013, 09/29/2014, 10/11/2015  . Influenza-Unspecified 10/10/2018  . Pneumococcal Conjugate-13 02/11/2015  . Pneumococcal Polysaccharide-23 01/10/2005, 06/26/2016  . Tdap 03/09/2012  . Zoster Recombinat (Shingrix) 07/09/2017, 09/25/2017     Lab Results  Component Value Date   CREATININE 0.74 11/12/2018   BUN 18 11/12/2018   NA 140 11/12/2018   K 4.7 11/12/2018   CL 99 11/12/2018   CO2 24 11/12/2018   Lab Results  Component Value Date   CHOL 176 07/08/2018   HDL 47 07/08/2018   LDLCALC 83 07/08/2018   TRIG 230 (H) 07/08/2018   CHOLHDL 3.7 07/08/2018   Lab Results  Component Value Date   TSH 1.070 07/08/2018   Lab Results  Component Value Date   HGBA1C 6.9 (H) 11/12/2018     Review of Systems  Constitutional: Negative for appetite change, fatigue and unexpected weight change.  Eyes: Negative for blurred vision.  Respiratory: Negative for cough, chest tightness and stridor.   Cardiovascular: Negative for chest pain and leg swelling.  Gastrointestinal: Positive for abdominal distention (mild distention and gurglin after trulicity dose). Negative for nausea and vomiting.  Endocrine: Negative for polydipsia and polyphagia.  Musculoskeletal: Positive for arthralgias (right hip and right shoulder since fall in December).  Neurological: Positive for light-headedness (intermittent). Negative for dizziness, syncope, weakness and headaches.    Patient Active Problem List   Diagnosis Date Noted  . Strain of right shoulder 07/05/2017  . Facial basal cell cancer 10/31/2016  . Migraine 10/31/2016  . Sleep  disorder 02/04/2016  . Lipoma of left upper extremity 10/11/2015  . Type II diabetes mellitus with complication ([place]) 123XX123  . Ovarian failure 11/10/2014  . Essential (primary) hypertension 07/15/2014  . Hyperlipidemia associated with type 2 diabetes mellitus  ([place]) 07/15/2014  . Dermatophytosis of groin 07/15/2014  . Tricompartment osteoarthritis of knee 07/15/2014    Allergies  Allergen Reactions  . Glipizide Swelling    hives  . Codeine Hives    Past Surgical History:  Procedure Laterality Date  . ABDOMINAL HYSTERECTOMY    . BASAL CELL CARCINOMA EXCISION    . CARPAL TUNNEL RELEASE Bilateral   . CARPAL TUNNEL RELEASE    . CATARACT EXTRACTION W/PHACO Left 09/07/2016   Procedure: CATARACT EXTRACTION PHACO AND INTRAOCULAR LENS PLACEMENT (IOC);  Surgeon: Eulogio Bear, MD;  Location: [facility] ORS;  Service: Ophthalmology;  Laterality: Left;  [place] 00:41.9 AP% 14.9 CDE 6.23 Fluid pack lot # [id number]  . CATARACT EXTRACTION W/PHACO Right 10/19/2016   Procedure: CATARACT EXTRACTION PHACO AND INTRAOCULAR LENS PLACEMENT ([place]);  Surgeon: Eulogio Bear, MD;  Location: [facility] ORS;  Service: Ophthalmology;  Laterality: Right;  casette lot # [id number] H [place]  00:59.5 AP%   16.5 CDE   9.79   . COLONOSCOPY  06/2012   normal (in [place])  . TONSILLECTOMY      Social History   Tobacco Use  . Smoking status: Never Smoker  . Smokeless tobacco: Never Used  . Tobacco comment: smoking cessation materials not required  Substance Use Topics  . Alcohol use: Not Currently  . Drug use: No     Medication list has been reviewed and updated.  Current Meds  Medication Sig  . Acetaminophen (TYLENOL ARTHRITIS PAIN PO) Take 1 capsule by mouth 2 (two) times daily.  Marland Kitchen albuterol (PROVENTIL HFA;VENTOLIN HFA) 108 (90 Base) MCG/ACT inhaler Inhale 2 puffs into the lungs every 6 (six) hours as needed for wheezing or shortness of breath.  Marland Kitchen albuterol (PROVENTIL) (2.5 MG/3ML) 0.083% nebulizer solution Take 3 mLs (2.5 mg total) by nebulization every 6 (six) hours as needed for wheezing or shortness of breath.  . cetirizine (ZYRTEC) 10 MG tablet Take 10 mg by mouth as needed.   . cyanocobalamin 1000 MCG tablet Take 1 tablet by mouth daily.  . Dulaglutide (TRULICITY) 1.5  0000000 SOPN Inject 1.5 mg into the skin once a week.  Marland Kitchen glucose blood (ACCU-CHEK AVIVA) test strip   . lisinopril-hydrochlorothiazide (ZESTORETIC) 20-12.5 MG tablet Take 1 tablet by mouth daily.  . metFORMIN (GLUCOPHAGE) 1000 MG tablet Take 1 tablet (1,000 mg total) by mouth 2 (two) times daily.  . Multiple Vitamins-Minerals (MULTIVITAMIN WITH MINERALS) tablet Take 1 tablet by mouth daily.  . naproxen sodium (ANAPROX) 220 MG tablet Take 220 mg by mouth 2 (two) times daily with a meal. PRN  . Omega 3-6-9 Fatty Acids (OMEGA 3-6-9 COMPLEX PO) Take 1,600 mg by mouth daily.  . Pyridoxine HCl (VITAMIN B6) 200 MG TABS Take 1 tablet by mouth 2 (two) times daily.   . simvastatin (ZOCOR) 20 MG tablet Take 1 tablet (20 mg total) by mouth daily.  . traZODone (DESYREL) 50 MG tablet Take 0.5 tablets (25 mg total) by mouth at bedtime as needed for sleep. (Patient taking differently: Take 25 mg by mouth at bedtime as needed for sleep. Pt takes very rarely)   Current Facility-Administered Medications for the 03/12/19 encounter (Office Visit) with Glean Hess, MD  Medication  . albuterol (PROVENTIL) (2.5 MG/3ML) 0.083% nebulizer solution 2.5 mg  PHQ 2/9 Scores 03/12/2019 11/12/2018 07/08/2018 07/03/2018  PHQ - 2 Score 0 0 0 0  PHQ- 9 Score 0 3 0 -    BP Readings from Last 3 Encounters:  03/12/19 130/68  11/12/18 116/64  07/08/18 126/82    Physical Exam Vitals and nursing note reviewed.  Constitutional:      General: She is not in acute distress.    Appearance: She is well-developed.  HENT:     Head: Normocephalic and atraumatic.  Cardiovascular:     Rate and Rhythm: Normal rate and regular rhythm.     Pulses: Normal pulses.     Heart sounds: No murmur.  Pulmonary:     Effort: Pulmonary effort is normal. No respiratory distress.     Breath sounds: No wheezing or rhonchi.  Musculoskeletal:     Right shoulder: No swelling or deformity. Decreased range of motion.     Left shoulder: No  swelling or deformity. Decreased range of motion.     Cervical back: Normal range of motion.     Right hip: Bony tenderness present. No tenderness. Decreased range of motion.     Right lower leg: No edema.     Left lower leg: No edema.  Lymphadenopathy:     Cervical: No cervical adenopathy.  Skin:    General: Skin is warm and dry.     Findings: No rash.  Neurological:     Mental Status: She is alert and oriented to person, place, and time.     Motor: Motor function is intact.     Gait: Gait abnormal (uses walker/rollator).  Psychiatric:        Behavior: Behavior normal.        Thought Content: Thought content normal.     Wt Readings from Last 3 Encounters:  03/12/19 226 lb (102.5 kg)  11/12/18 222 lb (100.7 kg)  07/08/18 236 lb (107 kg)    BP 130/68   Pulse 82   Temp (!) 95.7 F (35.4 C) (Temporal)   Ht 5\' 3"  (1.6 m)   Wt 226 lb (102.5 kg)   SpO2 97%   BMI 40.03 kg/m   Assessment and Plan: 1. Type II diabetes mellitus with complication (HCC) Having mild side effects to Trullicity at the higher dose; she just paid for a 3 months refill so will continue same dose. If side effects become intolerable, call a refill of the lower dose - Hemoglobin [id number] - Basic metabolic panel  2. Contusion of right hip, initial encounter Pt seems to be gradually recovering Continue tylenol and heat If worsening, would recommend Orthopedic referral  3. BMI 40.0-44.9, adult (HCC) Weight is still down 10 lbs from last Re-double diet efforts  Scheduled for first covid vaccine tomorrow. Partially dictated using Editor, commissioning. Any errors are unintentional.  Halina Maidens, MD [facility]  03/12/2019

## 2019-03-13 LAB — BASIC METABOLIC PANEL
BUN/Creatinine Ratio: 19 (ref 12–28)
BUN: 14 mg/dL (ref 8–27)
CO2: 22 mmol/L (ref 20–29)
Calcium: 10.5 mg/dL — ABNORMAL HIGH (ref 8.7–10.3)
Chloride: 100 mmol/L (ref 96–106)
Creatinine, Ser: 0.72 mg/dL (ref 0.57–1.00)
GFR calc Af Amer: 97 mL/min/{1.73_m2} (ref >59)
GFR calc non Af Amer: 85 mL/min/{1.73_m2} (ref >59)
Glucose: 129 mg/dL — ABNORMAL HIGH (ref 65–99)
Potassium: 4 mmol/L (ref 3.5–5.2)
Sodium: 142 mmol/L (ref 134–144)

## 2019-03-13 LAB — HEMOGLOBIN A1C
Est. average glucose Bld gHb Est-mCnc: 163 mg/dL
Hgb A1c MFr Bld: 7.3 % — ABNORMAL HIGH (ref 4.8–5.6)

## 2019-06-03 ENCOUNTER — Encounter: Payer: Self-pay | Admitting: Internal Medicine

## 2019-06-03 ENCOUNTER — Other Ambulatory Visit: Payer: Self-pay | Admitting: Internal Medicine

## 2019-06-03 DIAGNOSIS — Z1231 Encounter for screening mammogram for malignant neoplasm of breast: Secondary | ICD-10-CM

## 2019-07-07 ENCOUNTER — Ambulatory Visit (INDEPENDENT_AMBULATORY_CARE_PROVIDER_SITE_OTHER): Payer: Medicare Other

## 2019-07-07 VITALS — Ht 63.0 in | Wt 229.0 lb

## 2019-07-07 DIAGNOSIS — Z780 Asymptomatic menopausal state: Secondary | ICD-10-CM

## 2019-07-07 DIAGNOSIS — Z0000 Encounter for general adult medical examination without abnormal findings: Secondary | ICD-10-CM | POA: Diagnosis not present

## 2019-07-07 NOTE — Progress Notes (Signed)
Subjective:   Nancy Chen is a 74 y.o. female who presents for Medicare Annual (Subsequent) preventive examination.  Virtual Visit via Video Note  I connected with  Nancy Chen on 07/07/19 at 10:00 AM EDT via telehealth video enabled device and verified that I am speaking with the correct person using two identifiers.  Medicare Annual Wellness visit completed vitually due to Covid-19 pandemic.   Location: Patient: home Provider: office   I discussed the limitations, risks, security and privacy concerns of performing an evaluation and management service by telephone and the availability of in person appointments. The patient expressed understanding and agreed to proceed.  Some vital signs may be absent or patient reported.   Clemetine Marker, LPN   Review of Systems     Cardiac Risk Factors include: diabetes mellitus;advanced age (>67men, >43 women);dyslipidemia;hypertension;obesity (BMI >30kg/m2);sedentary lifestyle     Objective:    Today's Vitals   07/07/19 1017  Weight: 229 lb (103.9 kg)  Height: 5\' 3"  (1.6 m)   Body mass index is 40.57 kg/m.  Advanced Directives 07/07/2019 07/03/2018 07/02/2017 09/07/2016 06/26/2016 03/26/2015 02/11/2015  Does Patient Have a Medical Advance Directive? Yes Yes Yes Yes Yes Yes Yes  Type of Paramedic of [place];Living will [place];Living will [place];Living will Living will;Healthcare Power of [place];Living will [place];Living will [place];Living will  Does patient want to make changes to medical advance directive? - - - No - Patient declined - - -  Copy of [place] in Chart? Yes - validated most recent copy scanned in chart (See row information) Yes - validated most recent copy scanned in chart (See row information) Yes No - copy requested Yes - -    Current Medications  (verified) Outpatient Encounter Medications as of 07/07/2019  Medication Sig  . Acetaminophen (TYLENOL ARTHRITIS PAIN PO) Take 1 capsule by mouth 2 (two) times daily.  . cetirizine (ZYRTEC) 10 MG tablet Take 10 mg by mouth as needed.   . cyanocobalamin 1000 MCG tablet Take 1 tablet by mouth daily.  . Dulaglutide (TRULICITY) 1.5 XV/4.0GQ SOPN Inject 1.5 mg into the skin once a week.  Marland Kitchen glucose blood (ACCU-CHEK AVIVA) test strip   . lisinopril-hydrochlorothiazide (ZESTORETIC) 20-12.5 MG tablet Take 1 tablet by mouth daily.  . metFORMIN (GLUCOPHAGE) 1000 MG tablet Take 1 tablet (1,000 mg total) by mouth 2 (two) times daily.  . Multiple Vitamins-Minerals (MULTIVITAMIN WITH MINERALS) tablet Take 1 tablet by mouth daily.  . naproxen sodium (ANAPROX) 220 MG tablet Take 220 mg by mouth 2 (two) times daily with a meal. PRN  . Omega 3-6-9 Fatty Acids (OMEGA 3-6-9 COMPLEX PO) Take 1,600 mg by mouth daily.  . Pyridoxine HCl (VITAMIN B6) 200 MG TABS Take 1 tablet by mouth 2 (two) times daily.   . simvastatin (ZOCOR) 20 MG tablet Take 1 tablet (20 mg total) by mouth daily.  . traZODone (DESYREL) 50 MG tablet Take 0.5 tablets (25 mg total) by mouth at bedtime as needed for sleep. (Patient taking differently: Take 25 mg by mouth at bedtime as needed for sleep. Pt takes very rarely)  . albuterol (PROVENTIL HFA;VENTOLIN HFA) 108 (90 Base) MCG/ACT inhaler Inhale 2 puffs into the lungs every 6 (six) hours as needed for wheezing or shortness of breath. (Patient not taking: Reported on 07/07/2019)  . albuterol (PROVENTIL) (2.5 MG/3ML) 0.083% nebulizer solution Take 3 mLs (2.5 mg total)  by nebulization every 6 (six) hours as needed for wheezing or shortness of breath. (Patient not taking: Reported on 07/07/2019)   Facility-Administered Encounter Medications as of 07/07/2019  Medication  . albuterol (PROVENTIL) (2.5 MG/3ML) 0.083% nebulizer solution 2.5 mg    Allergies (verified) Glipizide and Codeine    History: Past Medical History:  Diagnosis Date  . Allergy   . Anxiety   . Arthritis   . Bronchitis   . Diabetes mellitus without complication ([place])   . Facial basal cell cancer 10/31/2016   also left lower eyelid, chest and shoulder  . Hypercholesteremia   . Hypertension   . Long-term use of high-risk medication 11/10/2014   Actos started 11/2014   . Lower extremity edema    Past Surgical History:  Procedure Laterality Date  . ABDOMINAL HYSTERECTOMY    . BASAL CELL CARCINOMA EXCISION    . CARPAL TUNNEL RELEASE Bilateral   . CARPAL TUNNEL RELEASE    . CATARACT EXTRACTION W/PHACO Left 09/07/2016   Procedure: CATARACT EXTRACTION PHACO AND INTRAOCULAR LENS PLACEMENT (IOC);  Surgeon: Eulogio Bear, MD;  Location: [facility] ORS;  Service: Ophthalmology;  Laterality: Left;  [place] 00:41.9 AP% 14.9 CDE 6.23 Fluid pack lot # [id number]  . CATARACT EXTRACTION W/PHACO Right 10/19/2016   Procedure: CATARACT EXTRACTION PHACO AND INTRAOCULAR LENS PLACEMENT ([place]);  Surgeon: Eulogio Bear, MD;  Location: [facility] ORS;  Service: Ophthalmology;  Laterality: Right;  casette lot # [id number] [place]  00:59.5 AP%   16.5 CDE   9.79   . COLONOSCOPY  06/2012   normal (in [place])  . TONSILLECTOMY     Family History  Problem Relation Age of Onset  . Alzheimer's disease Mother   . CAD Father   . Kidney disease Father   . Breast cancer Maternal Grandmother        great gm  . Diabetes Maternal Aunt   . Kidney disease Maternal Aunt    Social History   Socioeconomic History  . Marital status: Widowed    Spouse name: Not on file  . Number of children: 1  . Years of education: Not on file  . Highest education level: 12th grade  Occupational History  . Occupation: Retired  Tobacco Use  . Smoking status: Never Smoker  . Smokeless tobacco: Never Used  . Tobacco comment: smoking cessation materials not required  Vaping Use  . Vaping Use: Never used  Substance and Sexual Activity  . Alcohol use: Not  Currently  . Drug use: No  . Sexual activity: Never  Other Topics Concern  . Not on file  Social History Narrative  . Not on file   Social Determinants of Health   Financial Resource Strain: Low Risk   . Difficulty of Paying Living Expenses: Not hard at all  Food Insecurity: No Food Insecurity  . Worried About Charity fundraiser in the Last Year: Never true  . Ran Out of Food in the Last Year: Never true  Transportation Needs: No Transportation Needs  . Lack of Transportation (Medical): No  . Lack of Transportation (Non-Medical): No  Physical Activity: Inactive  . Days of Exercise per Week: 0 days  . Minutes of Exercise per Session: 0 min  Stress: No Stress Concern Present  . Feeling of Stress : Not at all  Social Connections: Socially Isolated  . Frequency of Communication with Friends and Family: More than three times a week  . Frequency of Social Gatherings with Friends and Family: Twice a week  .  Attends Religious Services: Never  . Active Member of Clubs or Organizations: No  . Attends Archivist Meetings: Never  . Marital Status: Widowed    Tobacco Counseling Counseling given: Not Answered Comment: smoking cessation materials not required   Clinical Intake:  Pre-visit preparation completed: Yes  Pain : No/denies pain     BMI - recorded: 40.57 Nutritional Status: BMI > 30  Obese Nutritional Risks: Nausea/ vomitting/ diarrhea (occasional diarrhea) Diabetes: Yes CBG done?: No Did pt. bring in CBG monitor from home?: No  How often do you need to have someone help you when you read instructions, pamphlets, or other written materials from your doctor or pharmacy?: 1 - Never  Nutrition Risk Assessment:  Has the patient had any N/V/D within the last 2 months?  Yes  Does the patient have any non-healing wounds?  No Has the patient had any unintentional weight loss or weight gain?  No   Diabetes:  Is the patient diabetic?  Yes  If diabetic, was a  CBG obtained today?  Yes  - 164 fasting at home per patient Did the patient bring in their glucometer from home?  No  How often do you monitor your CBG's? Every so often per patient.   Financial Strains and Diabetes Management:  Are you having any financial strains with the device, your supplies or your medication? Yes . Trulicity caused patient to go into donut hole early on and does not qualify for patient assistance; interested in possible alternative.   Does the patient want to be seen by Chronic Care Management for management of their diabetes?  No  Would the patient like to be referred to a Nutritionist or for Diabetic Management?  No   Diabetic Exams:  Diabetic Eye Exam: Completed 10/01/18 negative retinopathy.   Diabetic Foot Exam: Completed 11/12/18. Interpreter Needed?: No  Information entered by :: Clemetine Marker LPN   Activities of Daily Living In your present state of health, do you have any difficulty performing the following activities: 07/07/2019  Hearing? N  Comment decline hearing aids  Vision? N  Difficulty concentrating or making decisions? N  Walking or climbing stairs? N  Dressing or bathing? N  Doing errands, shopping? N  Preparing Food and eating ? N  Using the Toilet? N  In the past six months, have you accidently leaked urine? Y  Comment wears pads for protection  Do you have problems with loss of bowel control? N  Managing your Medications? N  Managing your Finances? N  Housekeeping or managing your Housekeeping? N  Some recent data might be hidden    Patient Care Team: Glean Hess, MD as PCP - General (Internal Medicine) Eulogio Bear, MD as Consulting Physician (Ophthalmology) Jannet Mantis, MD as Consulting Physician (Dermatology)  Indicate any recent Medical Services you may have received from other than [facility] providers in the past year (date may be approximate).     Assessment:   This is a routine wellness examination for  [place].  Hearing/Vision screen  Hearing Screening   125Hz  250Hz  500Hz  1000Hz  2000Hz  3000Hz  4000Hz  6000Hz  8000Hz   Right ear:           Left ear:           Comments: Pt denies hearing difficulty  Vision Screening Comments: Annual vision screenings done at [facility] Dr. Edison Pace  Dietary issues and exercise activities discussed: Current Exercise Habits: The patient does not participate in regular exercise at present, Exercise limited  by: orthopedic condition(s)  Goals    . DIET - INCREASE WATER INTAKE     Recommend to drink at least 6-8 8oz glasses of water per day.      Depression Screen PHQ 2/9 Scores 07/07/2019 03/12/2019 11/12/2018 07/08/2018 07/03/2018 03/13/2018 07/05/2017  PHQ - 2 Score 0 0 0 0 0 0 0  PHQ- 9 Score - 0 3 0 - - 0    Fall Risk Fall Risk  07/07/2019 03/12/2019 07/08/2018 07/03/2018 03/13/2018  Falls in the past year? 0 1 1 0 1  Number falls in past yr: 0 1 1 0 1  Injury with Fall? 0 1 0 0 0  Risk for fall due to : Impaired balance/gait;Impaired mobility;Orthopedic patient History of fall(s);Impaired balance/gait;Impaired mobility History of fall(s);Impaired balance/gait Impaired balance/gait Impaired balance/gait;History of fall(s);Impaired mobility  Risk for fall due to: Comment - - - - -  Follow up Falls prevention discussed Falls evaluation completed;Falls prevention discussed - Falls prevention discussed Falls evaluation completed    Any stairs in or around the home? Yes  If so, are there any without handrails? Yes  Home free of loose throw rugs in walkways, pet beds, electrical cords, etc? Yes  Adequate lighting in your home to reduce risk of falls? Yes   ASSISTIVE DEVICES UTILIZED TO PREVENT FALLS:  Life alert? NO Use of a cane, walker or w/c? Yes  Grab bars in the bathroom? Yes  Shower chair or bench in shower? Yes  Elevated toilet seat or a handicapped toilet? Yes   TIMED UP AND GO:  Was the test performed? No . Telephonic visit.   Cognitive  Function:     6CIT Screen 07/07/2019 07/03/2018 07/02/2017 06/26/2016  What Year? 0 points 0 points 0 points 0 points  What month? 0 points 0 points 0 points 0 points  What time? 0 points 0 points 0 points 0 points  Count back from 20 0 points 0 points 0 points 0 points  Months in reverse 0 points 0 points 0 points 0 points  Repeat phrase 0 points 0 points 0 points 0 points  Total Score 0 0 0 0    Immunizations Immunization History  Administered Date(s) Administered  . Hepatitis B 07/04/2011  . Hepatitis B, adult 07/04/2011  . Influenza, High Dose Seasonal PF 10/12/2016, 10/10/2017  . Influenza,inj,Quad PF,6+ Mos 09/29/2013, 09/29/2014, 10/11/2015  . Influenza-Unspecified 10/10/2018  . PFIZER SARS-COV-2 Vaccination 03/10/2019, 03/30/2019  . Pneumococcal Conjugate-13 02/11/2015  . Pneumococcal Polysaccharide-23 01/10/2005, 06/26/2016  . Tdap 03/09/2012  . Zoster Recombinat (Shingrix) 07/09/2017, 09/25/2017    TDAP status: Up to date   Flu Vaccine status: Up to date   Pneumococcal vaccine status: Up to date   Covid-19 vaccine status: Completed vaccines  Qualifies for Shingles Vaccine? Yes   Zostavax completed No   Shingrix Completed?: Yes  Screening Tests Health Maintenance  Topic Date Due  . MAMMOGRAM  07/15/2019  . INFLUENZA VACCINE  08/10/2019  . HEMOGLOBIN A1C  09/12/2019  . OPHTHALMOLOGY EXAM  10/01/2019  . FOOT EXAM  11/12/2019  . TETANUS/TDAP  03/10/2022  . COLONOSCOPY  06/24/2022  . DEXA SCAN  Completed  . COVID-19 Vaccine  Completed  . Hepatitis C Screening  Completed  . PNA vac Low Risk Adult  Completed    Health Maintenance  There are no preventive care reminders to display for this patient.  Colorectal cancer screening: Completed 06/23/12. Repeat every 10 years   Mammogram status: Completed 07/15/18. Repeat every year  Bone Density status: Completed 08/08/17. Results reflect: Bone density results: NORMAL. Repeat every 2 years.  Lung Cancer  Screening: (Low Dose CT Chest recommended if Age 20-80 years, 30 pack-year currently smoking OR have quit w/in 15years.) does not qualify.   Additional Screening:  Hepatitis C Screening: does qualify; Completed 02/11/15  Vision Screening: Recommended annual ophthalmology exams for early detection of glaucoma and other disorders of the eye. Is the patient up to date with their annual eye exam?  Yes  Who is the provider or what is the name of the office in which the patient attends annual eye exams? [place] Screening: Recommended annual dental exams for proper oral hygiene  Community Resource Referral / Chronic Care Management: CRR required this visit?  No   CCM required this visit?  No      Plan:     I have personally reviewed and noted the following in the patient's chart:   . Medical and social history . Use of alcohol, tobacco or illicit drugs  . Current medications and supplements . Functional ability and status . Nutritional status . Physical activity . Advanced directives . List of other physicians . Hospitalizations, surgeries, and ER visits in previous 12 months . Vitals . Screenings to include cognitive, depression, and falls . Referrals and appointments  In addition, I have reviewed and discussed with patient certain preventive protocols, quality metrics, and best practice recommendations. A written personalized care plan for preventive services as well as general preventive health recommendations were provided to patient.     Clemetine Marker, LPN   0/05/1100   Nurse Notes: pt doing well and appreciative of visit today. Pt plans to discuss possible referral to endocrinology at next visit on 07/15/19 to manage DM and she is also interested in possible alternative to Trulicity due to cost and states injections are painful.

## 2019-07-07 NOTE — Patient Instructions (Signed)
Nancy Chen , Thank you for taking time to come for your Medicare Wellness Visit. I appreciate your ongoing commitment to your health goals. Please review the following plan we discussed and let me know if I can assist you in the future.   Screening recommendations/referrals: Colonoscopy: done 06/23/12. Repeat in 2024. Mammogram: done 07/15/18. Scheduled for 07/22/19. Bone Density: done 08/08/17. Please call [phone] to schedule your bone density screening.   Recommended yearly ophthalmology/optometry visit for glaucoma screening and checkup Recommended yearly dental visit for hygiene and checkup  Vaccinations: Influenza vaccine: done 10/11/18 Pneumococcal vaccine: done 06/26/16 Tdap vaccine: done 03/09/12 Shingles vaccine: done 07/09/17 & 09/25/17   Covid-19:done 03/10/19 & 03/30/19  Conditions/risks identified: Recommend drinking 6-8 glasses of water per day.  Next appointment: Follow up in one year for your annual wellness visit    Preventive Care 65 Years and Older, Female Preventive care refers to lifestyle choices and visits with your health care provider that can promote health and wellness. What does preventive care include?  A yearly physical exam. This is also called an annual well check.  Dental exams once or twice a year.  Routine eye exams. Ask your health care provider how often you should have your eyes checked.  Personal lifestyle choices, including:  Daily care of your teeth and gums.  Regular physical activity.  Eating a healthy diet.  Avoiding tobacco and drug use.  Limiting alcohol use.  Practicing safe sex.  Taking low-dose aspirin every day.  Taking vitamin and mineral supplements as recommended by your health care provider. What happens during an annual well check? The services and screenings done by your health care provider during your annual well check will depend on your age, overall health, lifestyle risk factors, and family history of  disease. Counseling  Your health care provider may ask you questions about your:  Alcohol use.  Tobacco use.  Drug use.  Emotional well-being.  Home and relationship well-being.  Sexual activity.  Eating habits.  History of falls.  Memory and ability to understand (cognition).  Work and work Statistician.  Reproductive health. Screening  You may have the following tests or measurements:  Height, weight, and BMI.  Blood pressure.  Lipid and cholesterol levels. These may be checked every 5 years, or more frequently if you are over 55 years old.  Skin check.  Lung cancer screening. You may have this screening every year starting at age 98 if you have a 30-pack-year history of smoking and currently smoke or have quit within the past 15 years.  Fecal occult blood test (FOBT) of the stool. You may have this test every year starting at age 72.  Flexible sigmoidoscopy or colonoscopy. You may have a sigmoidoscopy every 5 years or a colonoscopy every 10 years starting at age 50.  Hepatitis C blood test.  Hepatitis B blood test.  Sexually transmitted disease (STD) testing.  Diabetes screening. This is done by checking your blood sugar (glucose) after you have not eaten for a while (fasting). You may have this done every 1-3 years.  Bone density scan. This is done to screen for osteoporosis. You may have this done starting at age 35.  Mammogram. This may be done every 1-2 years. Talk to your health care provider about how often you should have regular mammograms. Talk with your health care provider about your test results, treatment options, and if necessary, the need for more tests. Vaccines  Your health care provider may recommend certain vaccines, such as:  Influenza vaccine. This is recommended every year.  Tetanus, diphtheria, and acellular pertussis (Tdap, Td) vaccine. You may need a Td booster every 10 years.  Zoster vaccine. You may need this after age  7.  Pneumococcal 13-valent conjugate (PCV13) vaccine. One dose is recommended after age 54.  Pneumococcal polysaccharide (PPSV23) vaccine. One dose is recommended after age 41. Talk to your health care provider about which screenings and vaccines you need and how often you need them. This information is not intended to replace advice given to you by your health care provider. Make sure you discuss any questions you have with your health care provider. Document Released: 01/22/2015 Document Revised: 09/15/2015 Document Reviewed: 10/27/2014 Elsevier Interactive Patient Education  2017 [place] Prevention in the Home Falls can cause injuries. They can happen to people of all ages. There are many things you can do to make your home safe and to help prevent falls. What can I do on the outside of my home?  Regularly fix the edges of walkways and driveways and fix any cracks.  Remove anything that might make you trip as you walk through a door, such as a raised step or threshold.  Trim any bushes or trees on the path to your home.  Use bright outdoor lighting.  Clear any walking paths of anything that might make someone trip, such as rocks or tools.  Regularly check to see if handrails are loose or broken. Make sure that both sides of any steps have handrails.  Any raised decks and porches should have guardrails on the edges.  Have any leaves, snow, or ice cleared regularly.  Use sand or salt on walking paths during winter.  Clean up any spills in your garage right away. This includes oil or grease spills. What can I do in the bathroom?  Use night lights.  Install grab bars by the toilet and in the tub and shower. Do not use towel bars as grab bars.  Use non-skid mats or decals in the tub or shower.  If you need to sit down in the shower, use a plastic, non-slip stool.  Keep the floor dry. Clean up any water that spills on the floor as soon as it happens.  Remove  soap buildup in the tub or shower regularly.  Attach bath mats securely with double-sided non-slip rug tape.  Do not have throw rugs and other things on the floor that can make you trip. What can I do in the bedroom?  Use night lights.  Make sure that you have a light by your bed that is easy to reach.  Do not use any sheets or blankets that are too big for your bed. They should not hang down onto the floor.  Have a firm chair that has side arms. You can use this for support while you get dressed.  Do not have throw rugs and other things on the floor that can make you trip. What can I do in the kitchen?  Clean up any spills right away.  Avoid walking on wet floors.  Keep items that you use a lot in easy-to-reach places.  If you need to reach something above you, use a strong step stool that has a grab bar.  Keep electrical cords out of the way.  Do not use floor polish or wax that makes floors slippery. If you must use wax, use non-skid floor wax.  Do not have throw rugs and other things on the floor that  can make you trip. What can I do with my stairs?  Do not leave any items on the stairs.  Make sure that there are handrails on both sides of the stairs and use them. Fix handrails that are broken or loose. Make sure that handrails are as long as the stairways.  Check any carpeting to make sure that it is firmly attached to the stairs. Fix any carpet that is loose or worn.  Avoid having throw rugs at the top or bottom of the stairs. If you do have throw rugs, attach them to the floor with carpet tape.  Make sure that you have a light switch at the top of the stairs and the bottom of the stairs. If you do not have them, ask someone to add them for you. What else can I do to help prevent falls?  Wear shoes that:  Do not have high heels.  Have rubber bottoms.  Are comfortable and fit you well.  Are closed at the toe. Do not wear sandals.  If you use a  stepladder:  Make sure that it is fully opened. Do not climb a closed stepladder.  Make sure that both sides of the stepladder are locked into place.  Ask someone to hold it for you, if possible.  Clearly mark and make sure that you can see:  Any grab bars or handrails.  First and last steps.  Where the edge of each step is.  Use tools that help you move around (mobility aids) if they are needed. These include:  Canes.  Walkers.  Scooters.  Crutches.  Turn on the lights when you go into a dark area. Replace any light bulbs as soon as they burn out.  Set up your furniture so you have a clear path. Avoid moving your furniture around.  If any of your floors are uneven, fix them.  If there are any pets around you, be aware of where they are.  Review your medicines with your doctor. Some medicines can make you feel dizzy. This can increase your chance of falling. Ask your doctor what other things that you can do to help prevent falls. This information is not intended to replace advice given to you by your health care provider. Make sure you discuss any questions you have with your health care provider. Document Released: 10/22/2008 Document Revised: 06/03/2015 Document Reviewed: 01/30/2014 Elsevier Interactive Patient Education  2017 [employer].

## 2019-07-14 NOTE — Progress Notes (Signed)
Date:  07/15/2019   Name:  Nancy Chen   DOB:  11-12-1945   MRN:  [id number]   Chief Complaint: Annual Exam (breast exam no pap, want to know if is need D3?, trulicity cost problems, making her eat more (hungry) )  Nancy Chen is a 74 y.o. female who presents today for her yearly Exam. She feels fairly well. She reports exercising none. She reports she is sleeping fairly well. Breast complaints none.  Mammogram: scheduled this month DEXA scheduled in August Pap smear: discontinued Colonoscopy: 06/2012 repeat 2024 discontinued per pt  Immunization History  Administered Date(s) Administered  . Hepatitis B 07/04/2011  . Hepatitis B, adult 07/04/2011  . Influenza, High Dose Seasonal PF 10/12/2016, 10/10/2017  . Influenza,inj,Quad PF,6+ Mos 09/29/2013, 09/29/2014, 10/11/2015  . Influenza-Unspecified 10/10/2018  . PFIZER SARS-COV-2 Vaccination 03/10/2019, 03/30/2019  . Pneumococcal Conjugate-13 02/11/2015  . Pneumococcal Polysaccharide-23 01/10/2005, 06/26/2016  . Tdap 03/09/2012  . Zoster Recombinat (Shingrix) 07/09/2017, 09/25/2017    Hypertension This is a chronic problem. The problem is controlled. Pertinent negatives include no chest pain, headaches, palpitations or shortness of breath. Past treatments include diuretics and ACE inhibitors. The current treatment provides significant improvement.  Diabetes She presents for her follow-up diabetic visit. She has type 2 diabetes mellitus. Pertinent negatives for hypoglycemia include no dizziness, headaches, nervousness/anxiousness or tremors. Associated symptoms include polyuria (every day 5-6 time a night to urinate). Pertinent negatives for diabetes include no chest pain, no fatigue and no polydipsia. Current diabetic treatment includes oral agent (monotherapy) (metformin and Trulicity). An ACE inhibitor/angiotensin II receptor blocker is being taken. Eye exam is current.  Hyperlipidemia This is a chronic problem. The problem  is controlled. Pertinent negatives include no chest pain or shortness of breath. Current antihyperlipidemic treatment includes statins. The current treatment provides significant improvement of lipids.    Lab Results  Component Value Date   CREATININE 0.72 03/12/2019   BUN 14 03/12/2019   NA 142 03/12/2019   K 4.0 03/12/2019   CL 100 03/12/2019   CO2 22 03/12/2019   Lab Results  Component Value Date   CHOL 176 07/08/2018   HDL 47 07/08/2018   LDLCALC 83 07/08/2018   TRIG 230 (H) 07/08/2018   CHOLHDL 3.7 07/08/2018   Lab Results  Component Value Date   TSH 1.070 07/08/2018   Lab Results  Component Value Date   HGBA1C 7.3 (H) 03/12/2019   Lab Results  Component Value Date   WBC 4.9 07/08/2018   HGB 13.9 07/08/2018   HCT 42.5 07/08/2018   MCV 91 07/08/2018   PLT 225 07/08/2018   Lab Results  Component Value Date   ALT 44 (H) 07/08/2018   AST 36 07/08/2018   ALKPHOS 51 07/08/2018   BILITOT 0.4 07/08/2018     Review of Systems  Constitutional: Negative for chills, fatigue and fever.  HENT: Negative for congestion, hearing loss, tinnitus, trouble swallowing and voice change.   Eyes: Negative for visual disturbance.  Respiratory: Negative for cough, chest tightness, shortness of breath and wheezing.   Cardiovascular: Negative for chest pain, palpitations and leg swelling.  Gastrointestinal: Positive for diarrhea (every once in a while). Negative for abdominal pain, constipation and vomiting.  Endocrine: Positive for polyuria (every day 5-6 time a night to urinate). Negative for polydipsia.  Genitourinary: Negative for dysuria, frequency, genital sores, vaginal bleeding and vaginal discharge.  Musculoskeletal: Positive for arthralgias (knee pain), gait problem and joint swelling (left knee).  Skin: Negative for  color change and rash.  Neurological: Negative for dizziness, tremors, light-headedness and headaches.  Hematological: Negative for adenopathy. Does not  bruise/bleed easily.  Psychiatric/Behavioral: Positive for sleep disturbance. Negative for dysphoric mood. The patient is not nervous/anxious.     Patient Active Problem List   Diagnosis Date Noted  . Aortic atherosclerosis ([place]) 07/15/2019  . BMI 40.0-44.9, adult ([place]) 03/12/2019  . Strain of right shoulder 07/05/2017  . Facial basal cell cancer 10/31/2016  . Migraine 10/31/2016  . Sleep disorder 02/04/2016  . Lipoma of left upper extremity 10/11/2015  . Type II diabetes mellitus with complication ([place]) 37/62/8315  . Ovarian failure 11/10/2014  . Essential (primary) hypertension 07/15/2014  . Hyperlipidemia associated with type 2 diabetes mellitus ([place]) 07/15/2014  . Dermatophytosis of groin 07/15/2014  . Tricompartment osteoarthritis of knee 07/15/2014    Allergies  Allergen Reactions  . Glipizide Swelling    hives  . Codeine Hives    Past Surgical History:  Procedure Laterality Date  . ABDOMINAL HYSTERECTOMY    . BASAL CELL CARCINOMA EXCISION    . CARPAL TUNNEL RELEASE Bilateral   . CARPAL TUNNEL RELEASE    . CATARACT EXTRACTION W/PHACO Left 09/07/2016   Procedure: CATARACT EXTRACTION PHACO AND INTRAOCULAR LENS PLACEMENT (IOC);  Surgeon: Eulogio Bear, MD;  Location: [facility] ORS;  Service: Ophthalmology;  Laterality: Left;  [place] 00:41.9 AP% 14.9 CDE 6.23 Fluid pack lot # [id number]  . CATARACT EXTRACTION W/PHACO Right 10/19/2016   Procedure: CATARACT EXTRACTION PHACO AND INTRAOCULAR LENS PLACEMENT ([place]);  Surgeon: Eulogio Bear, MD;  Location: [facility] ORS;  Service: Ophthalmology;  Laterality: Right;  casette lot # [id number] H [place]  00:59.5 AP%   16.5 CDE   9.79   . COLONOSCOPY  06/2012   normal (in [place])  . TONSILLECTOMY      Social History   Tobacco Use  . Smoking status: Never Smoker  . Smokeless tobacco: Never Used  . Tobacco comment: smoking cessation materials not required  Vaping Use  . Vaping Use: Never used  Substance Use Topics  . Alcohol use: Not  Currently  . Drug use: No     Medication list has been reviewed and updated.  Current Meds  Medication Sig  . Acetaminophen (TYLENOL ARTHRITIS PAIN PO) Take 1 capsule by mouth 2 (two) times daily.  Marland Kitchen albuterol (PROVENTIL HFA;VENTOLIN HFA) 108 (90 Base) MCG/ACT inhaler Inhale 2 puffs into the lungs every 6 (six) hours as needed for wheezing or shortness of breath.  Marland Kitchen albuterol (PROVENTIL) (2.5 MG/3ML) 0.083% nebulizer solution Take 3 mLs (2.5 mg total) by nebulization every 6 (six) hours as needed for wheezing or shortness of breath.  . cetirizine (ZYRTEC) 10 MG tablet Take 10 mg by mouth as needed.   . cyanocobalamin 1000 MCG tablet Take 1 tablet by mouth daily.  . Dulaglutide (TRULICITY) 1.5 RS/8.5IO SOPN Inject 1.5 mg into the skin once a week.  Marland Kitchen glucose blood (ACCU-CHEK AVIVA) test strip   . lisinopril-hydrochlorothiazide (ZESTORETIC) 20-12.5 MG tablet Take 1 tablet by mouth daily.  . metFORMIN (GLUCOPHAGE) 1000 MG tablet Take 1 tablet (1,000 mg total) by mouth 2 (two) times daily.  . Multiple Vitamins-Minerals (MULTIVITAMIN WITH MINERALS) tablet Take 1 tablet by mouth daily.  . naproxen sodium (ANAPROX) 220 MG tablet Take 220 mg by mouth 2 (two) times daily with a meal. PRN  . Omega 3-6-9 Fatty Acids (OMEGA 3-6-9 COMPLEX PO) Take 1,600 mg by mouth daily.  . Pyridoxine HCl (VITAMIN B6) 200 MG TABS Take  1 tablet by mouth 2 (two) times daily.   . simvastatin (ZOCOR) 20 MG tablet Take 1 tablet (20 mg total) by mouth daily.  . traZODone (DESYREL) 50 MG tablet Take 0.5 tablets (25 mg total) by mouth at bedtime as needed for sleep. (Patient taking differently: Take 25 mg by mouth at bedtime as needed for sleep. Pt takes very rarely)  . Turmeric (QC TUMERIC COMPLEX PO) Take 1 tablet by mouth daily.    PHQ 2/9 Scores 07/15/2019 07/07/2019 03/12/2019 11/12/2018  PHQ - 2 Score 0 0 0 0  PHQ- 9 Score 1 - 0 3    GAD 7 : Generalized Anxiety Score 07/15/2019  Nervous, Anxious, on Edge 0  Control/stop  worrying 0  Worry too much - different things 0  Trouble relaxing 0  Restless 0  Easily annoyed or irritable 0  Afraid - awful might happen 0  Total GAD 7 Score 0  Anxiety Difficulty Not difficult at all    BP Readings from Last 3 Encounters:  07/15/19 104/70  03/12/19 130/68  11/12/18 116/64    Physical Exam Vitals and nursing note reviewed.  Constitutional:      General: She is not in acute distress.    Appearance: She is well-developed.  HENT:     Head: Normocephalic and atraumatic.     Right Ear: Tympanic membrane and ear canal normal.     Left Ear: Tympanic membrane and ear canal normal.     Nose:     Right Sinus: No maxillary sinus tenderness.     Left Sinus: No maxillary sinus tenderness.  Eyes:     General: No scleral icterus.       Right eye: No discharge.        Left eye: No discharge.     Conjunctiva/sclera: Conjunctivae normal.  Neck:     Thyroid: No thyromegaly.     Vascular: No carotid bruit.  Cardiovascular:     Rate and Rhythm: Normal rate and regular rhythm.     Pulses: Normal pulses.     Heart sounds: Normal heart sounds.  Pulmonary:     Effort: Pulmonary effort is normal. No respiratory distress.     Breath sounds: No wheezing.  Chest:     Breasts:        Right: No mass, nipple discharge, skin change or tenderness.        Left: No mass, nipple discharge, skin change or tenderness.  Abdominal:     General: Bowel sounds are normal.     Palpations: Abdomen is soft.     Tenderness: There is no abdominal tenderness.  Musculoskeletal:     Cervical back: Normal range of motion. No erythema.     Right knee: No effusion. Decreased range of motion.     Left knee: No effusion. Decreased range of motion.     Right lower leg: No edema.     Left lower leg: No edema.  Lymphadenopathy:     Cervical: No cervical adenopathy.  Skin:    General: Skin is warm and dry.     Capillary Refill: Capillary refill takes less than 2 seconds.     Findings: No rash.    Neurological:     General: No focal deficit present.     Mental Status: She is alert and oriented to person, place, and time.     Cranial Nerves: No cranial nerve deficit.     Sensory: No sensory deficit.     Deep Tendon Reflexes:  Reflexes are normal and symmetric.  Psychiatric:        Attention and Perception: Attention normal.        Mood and Affect: Mood normal.     Wt Readings from Last 3 Encounters:  07/15/19 228 lb (103.4 kg)  07/07/19 229 lb (103.9 kg)  03/12/19 226 lb (102.5 kg)    BP 104/70   Pulse 71   Temp 98.1 F (36.7 C) (Oral)   Ht 5\' 3"  (1.6 m)   Wt 228 lb (103.4 kg)   SpO2 95%   BMI 40.39 kg/m   Assessment and Plan: 1. Essential (primary) hypertension Clinically stable exam with well controlled BP on lisinopril hctz. Tolerating medications without side effects at this time. Pt to continue current regimen and low sodium diet; benefits of regular exercise as able discussed. - CBC with Differential/Platelet - POCT urinalysis dipstick  2. Hyperlipidemia associated with type 2 diabetes mellitus ([place]) Tolerating statin medication without side effects at this time LDL is almost at goal of < 70 on current dose Continue same therapy without change at this time - consider increasing to 40 mg simvastatin - Lipid panel  3. Type II diabetes mellitus with complication (HCC) Clinically stable by exam and report without s/s of hypoglycemia. DM complicated by Lipids, HTN. Tolerating medications fairly well.  Concern over cost of Trulicity and weight gain since starting it. Intolerant of SNU and Actos was not effective.  Would try to avoid insulin unless absolutely necessary. Pt will check with insurance on lower cost alternative medication - Comprehensive metabolic panel - Hemoglobin A1c - TSH  4. Sleep disorder Mild OSA but treatment declined  5. BMI 40.0-44.9, adult (HCC) Pt is encouraged to continue to work on weight, exercise as able  6. Aortic  atherosclerosis (HCC) Recent life line screening for carotid artery stenosis was mild Continue statin therapy   Partially dictated using Editor, commissioning. Any errors are unintentional.  Halina Maidens, MD [facility]  07/15/2019

## 2019-07-15 ENCOUNTER — Other Ambulatory Visit: Payer: Self-pay

## 2019-07-15 ENCOUNTER — Encounter: Payer: Self-pay | Admitting: Internal Medicine

## 2019-07-15 ENCOUNTER — Ambulatory Visit (INDEPENDENT_AMBULATORY_CARE_PROVIDER_SITE_OTHER): Payer: Medicare Other | Admitting: Internal Medicine

## 2019-07-15 VITALS — BP 104/70 | HR 71 | Temp 98.1°F | Ht 63.0 in | Wt 228.0 lb

## 2019-07-15 DIAGNOSIS — E785 Hyperlipidemia, unspecified: Secondary | ICD-10-CM

## 2019-07-15 DIAGNOSIS — I700 Atherosclerosis of aorta: Secondary | ICD-10-CM | POA: Insufficient documentation

## 2019-07-15 DIAGNOSIS — I10 Essential (primary) hypertension: Secondary | ICD-10-CM | POA: Diagnosis not present

## 2019-07-15 DIAGNOSIS — Z6841 Body Mass Index (BMI) 40.0 and over, adult: Secondary | ICD-10-CM

## 2019-07-15 DIAGNOSIS — E1169 Type 2 diabetes mellitus with other specified complication: Secondary | ICD-10-CM | POA: Diagnosis not present

## 2019-07-15 DIAGNOSIS — G479 Sleep disorder, unspecified: Secondary | ICD-10-CM

## 2019-07-15 DIAGNOSIS — E118 Type 2 diabetes mellitus with unspecified complications: Secondary | ICD-10-CM

## 2019-07-15 LAB — POCT URINALYSIS DIPSTICK
Bilirubin, UA: NEGATIVE
Blood, UA: NEGATIVE
Glucose, UA: NEGATIVE
Ketones, UA: NEGATIVE
Leukocytes, UA: NEGATIVE
Nitrite, UA: NEGATIVE
Protein, UA: NEGATIVE
Spec Grav, UA: 1.02 (ref 1.010–1.025)
Urobilinogen, UA: 0.2 E.U./dL
pH, UA: 5 (ref 5.0–8.0)

## 2019-07-15 NOTE — Patient Instructions (Signed)
Call [facility] and ask:  What lower cost alternative is there to Januvia?      Natasha Bence  What lower cost alternative is there to Trulicity?  Ozempic  Bydureon  Victoza

## 2019-07-16 LAB — COMPREHENSIVE METABOLIC PANEL
ALT: 32 IU/L (ref 0–32)
AST: 33 IU/L (ref 0–40)
Albumin/Globulin Ratio: 2 (ref 1.2–2.2)
Albumin: 4.6 g/dL (ref 3.7–4.7)
Alkaline Phosphatase: 50 IU/L (ref 48–121)
BUN/Creatinine Ratio: 28 (ref 12–28)
BUN: 19 mg/dL (ref 8–27)
Bilirubin Total: 0.4 mg/dL (ref 0.0–1.2)
CO2: 25 mmol/L (ref 20–29)
Calcium: 10.1 mg/dL (ref 8.7–10.3)
Chloride: 100 mmol/L (ref 96–106)
Creatinine, Ser: 0.67 mg/dL (ref 0.57–1.00)
GFR calc Af Amer: 102 mL/min/{1.73_m2} (ref >59)
GFR calc non Af Amer: 88 mL/min/{1.73_m2} (ref >59)
Globulin, Total: 2.3 g/dL (ref 1.5–4.5)
Glucose: 158 mg/dL — ABNORMAL HIGH (ref 65–99)
Potassium: 4.7 mmol/L (ref 3.5–5.2)
Sodium: 147 mmol/L — ABNORMAL HIGH (ref 134–144)
Total Protein: 6.9 g/dL (ref 6.0–8.5)

## 2019-07-16 LAB — HEMOGLOBIN A1C
Est. average glucose Bld gHb Est-mCnc: 174 mg/dL
Hgb A1c MFr Bld: 7.7 % — ABNORMAL HIGH (ref 4.8–5.6)

## 2019-07-16 LAB — CBC WITH DIFFERENTIAL/PLATELET
Basophils Absolute: 0 10*3/uL (ref 0.0–0.2)
Basos: 1 %
EOS (ABSOLUTE): 0.2 10*3/uL (ref 0.0–0.4)
Eos: 3 %
Hematocrit: 41.7 % (ref 34.0–46.6)
Hemoglobin: 13.7 g/dL (ref 11.1–15.9)
Immature Grans (Abs): 0 10*3/uL (ref 0.0–0.1)
Immature Granulocytes: 0 %
Lymphocytes Absolute: 1.9 10*3/uL (ref 0.7–3.1)
Lymphs: 32 %
MCH: 29.8 pg (ref 26.6–33.0)
MCHC: 32.9 g/dL (ref 31.5–35.7)
MCV: 91 fL (ref 79–97)
Monocytes Absolute: 0.5 10*3/uL (ref 0.1–0.9)
Monocytes: 8 %
Neutrophils Absolute: 3.4 10*3/uL (ref 1.4–7.0)
Neutrophils: 56 %
Platelets: 240 10*3/uL (ref 150–450)
RBC: 4.59 x10E6/uL (ref 3.77–5.28)
RDW: 12.8 % (ref 11.7–15.4)
WBC: 6 10*3/uL (ref 3.4–10.8)

## 2019-07-16 LAB — LIPID PANEL
Chol/HDL Ratio: 3.7 ratio (ref 0.0–4.4)
Cholesterol, Total: 164 mg/dL (ref 100–199)
HDL: 44 mg/dL (ref >39)
LDL Chol Calc (NIH): 73 mg/dL (ref 0–99)
Triglycerides: 293 mg/dL — ABNORMAL HIGH (ref 0–149)
VLDL Cholesterol Cal: 47 mg/dL — ABNORMAL HIGH (ref 5–40)

## 2019-07-16 LAB — TSH: TSH: 1.03 u[IU]/mL (ref 0.450–4.500)

## 2019-07-22 ENCOUNTER — Ambulatory Visit
Admission: RE | Admit: 2019-07-22 | Discharge: 2019-07-22 | Disposition: A | Discharge status: Home / Self Care | Payer: Medicare Other | Source: Ambulatory Visit | Attending: Internal Medicine | Admitting: Internal Medicine

## 2019-07-22 ENCOUNTER — Other Ambulatory Visit: Payer: Self-pay

## 2019-07-22 DIAGNOSIS — Z1231 Encounter for screening mammogram for malignant neoplasm of breast: Secondary | ICD-10-CM | POA: Diagnosis not present

## 2019-08-11 ENCOUNTER — Other Ambulatory Visit: Payer: Self-pay

## 2019-08-11 ENCOUNTER — Ambulatory Visit
Admission: RE | Admit: 2019-08-11 | Discharge: 2019-08-11 | Disposition: A | Discharge status: Home / Self Care | Payer: Medicare Other | Source: Ambulatory Visit | Attending: Internal Medicine | Admitting: Internal Medicine

## 2019-08-11 DIAGNOSIS — M85851 Other specified disorders of bone density and structure, right thigh: Secondary | ICD-10-CM | POA: Diagnosis not present

## 2019-08-11 DIAGNOSIS — Z780 Asymptomatic menopausal state: Secondary | ICD-10-CM | POA: Diagnosis not present

## 2019-08-12 ENCOUNTER — Encounter: Payer: Self-pay | Admitting: Internal Medicine

## 2019-08-19 ENCOUNTER — Other Ambulatory Visit: Payer: Self-pay | Admitting: Internal Medicine

## 2019-08-19 NOTE — Telephone Encounter (Signed)
Requested Prescriptions  °Pending Prescriptions Disp Refills  °• metFORMIN (GLUCOPHAGE) 1000 MG tablet [Pharmacy Med Name: METFORMIN TAB 1000MG] 180 tablet 1  °  Sig: TAKE 1 TABLET TWICE A DAY  °  ° Endocrinology:  Diabetes - Biguanides Passed - 08/19/2019  8:06 PM  °  °  Passed - Cr in normal range and within 360 days  °  Creatinine, Ser  °Date Value Ref Range Status  °07/15/2019 0.67 0.57 - 1.00 mg/dL Final  °   °  °  Passed - HBA1C is between 0 and 7.9 and within 180 days  °  Hgb A1c MFr Bld  °Date Value Ref Range Status  °07/15/2019 7.7 (H) 4.8 - 5.6 % Final  °  Comment:  °           Prediabetes: 5.7 - 6.4 °         Diabetes: >6.4 °         Glycemic control for adults with diabetes: <7.0 °  °   °  °  Passed - eGFR in normal range and within 360 days  °  GFR calc Af Amer  °Date Value Ref Range Status  °07/15/2019 102 >59 mL/min/1.73 Final  °  Comment:  °  **[facility] currently reports eGFR in compliance with the current** °  recommendations of the National Kid[employer] Foundation. [facility] will °  update reporting as new guidelines are published from the NKF-ASN °  Task force. °  ° °GFR calc non Af Amer  °Date Value Ref Range Status  °07/15/2019 88 >59 mL/min/1.73 Final  °   °  °  Passed - Valid encounter within last 6 months  °  Recent Outpatient Visits   °      ° 1 month ago Essential (primary) hypertension  ° [facility] Berglund, Laura H, MD  ° 5 months ago Type II diabetes mellitus with complication (HCC)  ° [facility] Berglund, Laura H, MD  ° 9 months ago Type II diabetes mellitus with complication (HCC)  ° [facility] Berglund, Laura H, MD  ° 1 year ago Essential (primary) hypertension  ° [facility] Berglund, Laura H, MD  ° 1 year ago Controlled type 2 diabetes mellitus without complication, without long-term current use of insulin (HCC)  ° [facility] Berglund, Laura H, MD  °  °  °Future Appointments   °        ° In 2 months Berglund, Laura H, MD [facility]  ° In 11 months Berglund, Laura H, MD [facility]  °  ° °  °  °  ° °

## 2019-09-17 ENCOUNTER — Encounter: Payer: Self-pay | Admitting: Internal Medicine

## 2019-09-17 ENCOUNTER — Ambulatory Visit (INDEPENDENT_AMBULATORY_CARE_PROVIDER_SITE_OTHER): Payer: Medicare Other | Admitting: Internal Medicine

## 2019-09-17 ENCOUNTER — Other Ambulatory Visit: Payer: Self-pay

## 2019-09-17 VITALS — BP 144/68 | HR 105 | Ht 63.0 in | Wt 228.0 lb

## 2019-09-17 DIAGNOSIS — H9319 Tinnitus, unspecified ear: Secondary | ICD-10-CM | POA: Diagnosis not present

## 2019-09-17 DIAGNOSIS — H6123 Impacted cerumen, bilateral: Secondary | ICD-10-CM

## 2019-09-17 NOTE — Patient Instructions (Signed)
[facility]  [phone]  Call and change scheduled appt if needed.  You are scheduled Monday, September 13th at 830AM. Bring your ID, insurance Card, and no visitors with you is allowed at your appt. You are required to wear a mask with you to this appt.

## 2019-09-17 NOTE — Progress Notes (Signed)
Date:  09/17/2019   Name:  Nancy Chen   DOB:  14-Sep-1945   MRN:  [id number]   Chief Complaint: Tinnitus (not sleeping- has not slept in 4 days because the noise in her ears. She thinks its coming from her left ear. Started Saturday with pressure in her heads. )  HPI  Lab Results  Component Value Date   CREATININE 0.67 07/15/2019   BUN 19 07/15/2019   NA 147 (H) 07/15/2019   K 4.7 07/15/2019   CL 100 07/15/2019   CO2 25 07/15/2019   Lab Results  Component Value Date   CHOL 164 07/15/2019   HDL 44 07/15/2019   LDLCALC 73 07/15/2019   TRIG 293 (H) 07/15/2019   CHOLHDL 3.7 07/15/2019   Lab Results  Component Value Date   TSH 1.030 07/15/2019   Lab Results  Component Value Date   HGBA1C 7.7 (H) 07/15/2019   Lab Results  Component Value Date   WBC 6.0 07/15/2019   HGB 13.7 07/15/2019   HCT 41.7 07/15/2019   MCV 91 07/15/2019   PLT 240 07/15/2019   Lab Results  Component Value Date   ALT 32 07/15/2019   AST 33 07/15/2019   ALKPHOS 50 07/15/2019   BILITOT 0.4 07/15/2019     Review of Systems  Constitutional: Negative for chills and fever.  HENT: Positive for hearing loss and tinnitus. Negative for ear discharge and ear pain.   Respiratory: Negative for chest tightness and shortness of breath.   Cardiovascular: Negative for chest pain.  Neurological: Negative for dizziness, light-headedness and headaches.    Patient Active Problem List   Diagnosis Date Noted  . Aortic atherosclerosis ([place]) 07/15/2019  . BMI 40.0-44.9, adult ([place]) 03/12/2019  . Strain of right shoulder 07/05/2017  . Facial basal cell cancer 10/31/2016  . Migraine 10/31/2016  . Sleep disorder 02/04/2016  . Lipoma of left upper extremity 10/11/2015  . Type II diabetes mellitus with complication ([place]) 19/62/2297  . Ovarian failure 11/10/2014  . Essential (primary) hypertension 07/15/2014  . Hyperlipidemia associated with type 2 diabetes mellitus ([place]) 07/15/2014  . Dermatophytosis of  groin 07/15/2014  . Tricompartment osteoarthritis of knee 07/15/2014    Allergies  Allergen Reactions  . Glipizide Swelling    hives  . Codeine Hives    Past Surgical History:  Procedure Laterality Date  . ABDOMINAL HYSTERECTOMY    . BASAL CELL CARCINOMA EXCISION    . CARPAL TUNNEL RELEASE Bilateral   . CARPAL TUNNEL RELEASE    . CATARACT EXTRACTION W/PHACO Left 09/07/2016   Procedure: CATARACT EXTRACTION PHACO AND INTRAOCULAR LENS PLACEMENT (IOC);  Surgeon: Eulogio Bear, MD;  Location: [facility] ORS;  Service: Ophthalmology;  Laterality: Left;  [place] 00:41.9 AP% 14.9 CDE 6.23 Fluid pack lot # [id number]  . CATARACT EXTRACTION W/PHACO Right 10/19/2016   Procedure: CATARACT EXTRACTION PHACO AND INTRAOCULAR LENS PLACEMENT ([place]);  Surgeon: Eulogio Bear, MD;  Location: [facility] ORS;  Service: Ophthalmology;  Laterality: Right;  casette lot # [id number] H [place]  00:59.5 AP%   16.5 CDE   9.79   . COLONOSCOPY  06/2012   normal (in [place])  . TONSILLECTOMY      Social History   Tobacco Use  . Smoking status: Never Smoker  . Smokeless tobacco: Never Used  . Tobacco comment: smoking cessation materials not required  Vaping Use  . Vaping Use: Never used  Substance Use Topics  . Alcohol use: Not Currently  . Drug use: No  Medication list has been reviewed and updated.  Current Meds  Medication Sig  . Acetaminophen (TYLENOL ARTHRITIS PAIN PO) Take 1 capsule by mouth 2 (two) times daily.  . cetirizine (ZYRTEC) 10 MG tablet Take 10 mg by mouth as needed.   . cyanocobalamin 1000 MCG tablet Take 1 tablet by mouth daily.  . Dulaglutide (TRULICITY) 1.5 GY/6.9SW SOPN Inject 1.5 mg into the skin once a week.  Marland Kitchen glucose blood (ACCU-CHEK AVIVA) test strip   . lisinopril-hydrochlorothiazide (ZESTORETIC) 20-12.5 MG tablet Take 1 tablet by mouth daily.  . metFORMIN (GLUCOPHAGE) 1000 MG tablet TAKE 1 TABLET TWICE A DAY  . Multiple Vitamins-Minerals (MULTIVITAMIN WITH MINERALS) tablet Take 1 tablet  by mouth daily.  . naproxen sodium (ANAPROX) 220 MG tablet Take 220 mg by mouth 2 (two) times daily with a meal. PRN  . Omega 3-6-9 Fatty Acids (OMEGA 3-6-9 COMPLEX PO) Take 1,600 mg by mouth daily.  . Pyridoxine HCl (VITAMIN B6) 200 MG TABS Take 1 tablet by mouth 2 (two) times daily.   . simvastatin (ZOCOR) 20 MG tablet Take 1 tablet (20 mg total) by mouth daily.  . traZODone (DESYREL) 50 MG tablet Take 0.5 tablets (25 mg total) by mouth at bedtime as needed for sleep. (Patient taking differently: Take 25 mg by mouth at bedtime as needed for sleep. Pt takes very rarely)  . Turmeric (QC TUMERIC COMPLEX PO) Take 1 tablet by mouth daily.  . [DISCONTINUED] albuterol (PROVENTIL) (2.5 MG/3ML) 0.083% nebulizer solution Take 3 mLs (2.5 mg total) by nebulization every 6 (six) hours as needed for wheezing or shortness of breath.    PHQ 2/9 Scores 07/15/2019 07/07/2019 03/12/2019 11/12/2018  PHQ - 2 Score 0 0 0 0  PHQ- 9 Score 1 - 0 3    GAD 7 : Generalized Anxiety Score 07/15/2019  Nervous, Anxious, on Edge 0  Control/stop worrying 0  Worry too much - different things 0  Trouble relaxing 0  Restless 0  Easily annoyed or irritable 0  Afraid - awful might happen 0  Total GAD 7 Score 0  Anxiety Difficulty Not difficult at all    BP Readings from Last 3 Encounters:  09/17/19 (!) 144/68  07/15/19 104/70  03/12/19 130/68    Physical Exam Vitals and nursing note reviewed.  Constitutional:      General: She is not in acute distress.    Appearance: She is well-developed.  HENT:     Head: Normocephalic and atraumatic.     Right Ear: External ear normal. There is impacted cerumen.     Left Ear: External ear normal. There is impacted cerumen.  Cardiovascular:     Rate and Rhythm: Normal rate and regular rhythm.  Pulmonary:     Effort: Pulmonary effort is normal. No respiratory distress.     Breath sounds: No wheezing or rhonchi.  Musculoskeletal:        General: Normal range of motion.  Skin:     General: Skin is warm and dry.     Findings: No rash.  Neurological:     Mental Status: She is alert and oriented to person, place, and time.  Psychiatric:        Behavior: Behavior normal.        Thought Content: Thought content normal.     Wt Readings from Last 3 Encounters:  09/17/19 228 lb (103.4 kg)  07/15/19 228 lb (103.4 kg)  07/07/19 229 lb (103.9 kg)    BP (!) 144/68   Pulse Marland Kitchen)  105   Ht 5\' 3"  (1.6 m)   Wt 228 lb (103.4 kg)   SpO2 96%   BMI 40.39 kg/m   Assessment and Plan: 1. Subjective tinnitus, unspecified laterality Likely due to impacted cerumen Refer to ENT - appt Monday in [place]  2. Impacted cerumen, bilateral Will not attempt to remove due to concern for trapping water and making her symptoms worse Pt will try to get a driver and make an earlier appointment   Partially dictated using Dragon software. Any errors are unintentional.  Halina Maidens, MD [facility]  09/17/2019

## 2019-09-19 ENCOUNTER — Ambulatory Visit: Payer: Medicare Other | Admitting: Internal Medicine

## 2019-09-22 ENCOUNTER — Other Ambulatory Visit: Payer: Self-pay | Admitting: Physician Assistant

## 2019-09-22 DIAGNOSIS — H6123 Impacted cerumen, bilateral: Secondary | ICD-10-CM | POA: Diagnosis not present

## 2019-09-22 DIAGNOSIS — H9319 Tinnitus, unspecified ear: Secondary | ICD-10-CM

## 2019-09-22 DIAGNOSIS — H903 Sensorineural hearing loss, bilateral: Secondary | ICD-10-CM | POA: Diagnosis not present

## 2019-10-01 ENCOUNTER — Telehealth: Payer: Self-pay

## 2019-10-01 NOTE — Chronic Care Management (AMB) (Signed)
  Chronic Care Management   Note  10/01/2019 Name: Nancy Chen MRN: [id number] DOB: 1945/08/24  JADEE GOLEBIEWSKI is a 74 y.o. year old female who is a primary care patient of Glean Hess, MD. I reached out to Renato Battles by phone today in response to a referral sent by Ms. Alyse Low Murin's health plan.     Ms. Southgate was given information about Chronic Care Management services today including:  1. CCM service includes personalized support from designated clinical staff supervised by her physician, including individualized plan of care and coordination with other care providers 2. 24/7 contact phone numbers for assistance for urgent and routine care needs. 3. Service will only be billed when office clinical staff spend 20 minutes or more in a month to coordinate care. 4. Only one practitioner may furnish and bill the service in a calendar month. 5. The patient may stop CCM services at any time (effective at the end of the month) by phone call to the office staff. 6. The patient will be responsible for cost sharing (co-pay) of up to 20% of the service fee (after annual deductible is met).  Patient did not agree to enrollment in care management services and does not wish to consider at this time.  Follow up plan: The patient has been provided with contact information for the care management team and has been advised to call with any health related questions or concerns.   Noreene Larsson, [address], [place], [place] [address] Direct Dial: [phone] [email or web address] Website: [email or web address]

## 2019-10-07 DIAGNOSIS — E119 Type 2 diabetes mellitus without complications: Secondary | ICD-10-CM | POA: Diagnosis not present

## 2019-10-07 LAB — HM DIABETES EYE EXAM

## 2019-10-09 ENCOUNTER — Encounter: Payer: Self-pay | Admitting: Internal Medicine

## 2019-10-09 ENCOUNTER — Ambulatory Visit
Admission: RE | Admit: 2019-10-09 | Discharge: 2019-10-09 | Disposition: A | Discharge status: Home / Self Care | Payer: Medicare Other | Source: Ambulatory Visit | Attending: Physician Assistant | Admitting: Physician Assistant

## 2019-10-09 ENCOUNTER — Other Ambulatory Visit: Payer: Self-pay

## 2019-10-09 DIAGNOSIS — H9319 Tinnitus, unspecified ear: Secondary | ICD-10-CM | POA: Insufficient documentation

## 2019-10-09 DIAGNOSIS — R42 Dizziness and giddiness: Secondary | ICD-10-CM | POA: Diagnosis not present

## 2019-10-09 DIAGNOSIS — G319 Degenerative disease of nervous system, unspecified: Secondary | ICD-10-CM | POA: Diagnosis not present

## 2019-10-09 DIAGNOSIS — I6782 Cerebral ischemia: Secondary | ICD-10-CM | POA: Diagnosis not present

## 2019-10-09 MED ORDER — GADOBUTROL 1 MMOL/ML IV SOLN
10.0000 mL | Freq: Once | INTRAVENOUS | Status: AC | PRN
  Administered 2019-10-09: 10 mL via INTRAVENOUS

## 2019-10-13 DIAGNOSIS — Z23 Encounter for immunization: Secondary | ICD-10-CM | POA: Diagnosis not present

## 2019-10-15 ENCOUNTER — Telehealth: Payer: Self-pay

## 2019-10-15 NOTE — Telephone Encounter (Unsigned)
Copied from [place] [phone]. Topic: General - Other >> Oct 15, 2019  2:35 PM Celene Kras wrote: Reason for CRM: Pt called and is requesting to have a call back to discuss MRI and results from ear doctor. Please advise.

## 2019-10-15 NOTE — Telephone Encounter (Signed)
Spoke to pt, she was told they would forward results to PCP to discuss with patient if anything else needed to be done.

## 2019-10-15 NOTE — Telephone Encounter (Signed)
Patient informed of MRI showings- Dr Army Melia recommended patient to take aspirin 80 mg daily. She verbalized understanding.  CM

## 2019-11-06 ENCOUNTER — Other Ambulatory Visit: Payer: Self-pay

## 2019-11-06 ENCOUNTER — Ambulatory Visit (INDEPENDENT_AMBULATORY_CARE_PROVIDER_SITE_OTHER): Payer: Medicare Other | Admitting: Internal Medicine

## 2019-11-06 ENCOUNTER — Encounter: Payer: Self-pay | Admitting: Internal Medicine

## 2019-11-06 VITALS — BP 120/78 | HR 85 | Temp 98.0°F | Ht 63.0 in | Wt 229.0 lb

## 2019-11-06 DIAGNOSIS — I10 Essential (primary) hypertension: Secondary | ICD-10-CM

## 2019-11-06 DIAGNOSIS — E1169 Type 2 diabetes mellitus with other specified complication: Secondary | ICD-10-CM | POA: Diagnosis not present

## 2019-11-06 DIAGNOSIS — E785 Hyperlipidemia, unspecified: Secondary | ICD-10-CM | POA: Diagnosis not present

## 2019-11-06 DIAGNOSIS — E118 Type 2 diabetes mellitus with unspecified complications: Secondary | ICD-10-CM

## 2019-11-06 NOTE — Progress Notes (Signed)
Date:  11/06/2019   Name:  Nancy Chen   DOB:  11/29/1945   MRN:  [id number]   Chief Complaint: Diabetes (follow up ) and Hypertension  Hypertension This is a chronic problem. The problem is controlled. Pertinent negatives include no chest pain, headaches, palpitations or shortness of breath. Past treatments include ACE inhibitors and diuretics.  Diabetes She presents for her follow-up diabetic visit. She has type 2 diabetes mellitus. Pertinent negatives for hypoglycemia include no headaches or tremors. Pertinent negatives for diabetes include no chest pain, no fatigue, no polydipsia and no polyuria. Current diabetic treatment includes oral agent (dual therapy) (metformin and Trulicity ). She is compliant with treatment all of the time. An ACE inhibitor/angiotensin II receptor blocker is being taken.    Lab Results  Component Value Date   CREATININE 0.67 07/15/2019   BUN 19 07/15/2019   NA 147 (H) 07/15/2019   K 4.7 07/15/2019   CL 100 07/15/2019   CO2 25 07/15/2019   Lab Results  Component Value Date   CHOL 164 07/15/2019   HDL 44 07/15/2019   LDLCALC 73 07/15/2019   TRIG 293 (H) 07/15/2019   CHOLHDL 3.7 07/15/2019   Lab Results  Component Value Date   TSH 1.030 07/15/2019   Lab Results  Component Value Date   HGBA1C 7.7 (H) 07/15/2019   Lab Results  Component Value Date   WBC 6.0 07/15/2019   HGB 13.7 07/15/2019   HCT 41.7 07/15/2019   MCV 91 07/15/2019   PLT 240 07/15/2019   Lab Results  Component Value Date   ALT 32 07/15/2019   AST 33 07/15/2019   ALKPHOS 50 07/15/2019   BILITOT 0.4 07/15/2019     Review of Systems  Constitutional: Negative for appetite change, fatigue, fever and unexpected weight change.  HENT: Negative for tinnitus and trouble swallowing.   Eyes: Negative for visual disturbance.  Respiratory: Negative for cough, chest tightness and shortness of breath.   Cardiovascular: Negative for chest pain, palpitations and leg swelling.   Gastrointestinal: Negative for abdominal pain.  Endocrine: Negative for polydipsia and polyuria.  Genitourinary: Negative for dysuria and hematuria.  Musculoskeletal: Positive for arthralgias and gait problem.  Neurological: Negative for tremors, numbness and headaches.  Psychiatric/Behavioral: Negative for dysphoric mood.    Patient Active Problem List   Diagnosis Date Noted  . Aortic atherosclerosis ([place]) 07/15/2019  . BMI 40.0-44.9, adult ([place]) 03/12/2019  . Strain of right shoulder 07/05/2017  . Facial basal cell cancer 10/31/2016  . Migraine 10/31/2016  . Sleep disorder 02/04/2016  . Lipoma of left upper extremity 10/11/2015  . Type II diabetes mellitus with complication ([place]) 02/40/9735  . Ovarian failure 11/10/2014  . Essential (primary) hypertension 07/15/2014  . Hyperlipidemia associated with type 2 diabetes mellitus ([place]) 07/15/2014  . Dermatophytosis of groin 07/15/2014  . Tricompartment osteoarthritis of knee 07/15/2014    Allergies  Allergen Reactions  . Glipizide Swelling    hives  . Codeine Hives    Past Surgical History:  Procedure Laterality Date  . ABDOMINAL HYSTERECTOMY    . BASAL CELL CARCINOMA EXCISION    . CARPAL TUNNEL RELEASE Bilateral   . CARPAL TUNNEL RELEASE    . CATARACT EXTRACTION W/PHACO Left 09/07/2016   Procedure: CATARACT EXTRACTION PHACO AND INTRAOCULAR LENS PLACEMENT (IOC);  Surgeon: Eulogio Bear, MD;  Location: [facility] ORS;  Service: Ophthalmology;  Laterality: Left;  [place] 00:41.9 AP% 14.9 CDE 6.23 Fluid pack lot # [id number]  . CATARACT EXTRACTION  W/PHACO Right 10/19/2016   Procedure: CATARACT EXTRACTION PHACO AND INTRAOCULAR LENS PLACEMENT (IOC);  Surgeon: Eulogio Bear, MD;  Location: [facility] ORS;  Service: Ophthalmology;  Laterality: Right;  casette lot # [id number] H [place]  00:59.5 AP%   16.5 CDE   9.79   . COLONOSCOPY  06/2012   normal (in [place])  . TONSILLECTOMY      Social History   Tobacco Use  . Smoking status: Never Smoker   . Smokeless tobacco: Never Used  . Tobacco comment: smoking cessation materials not required  Vaping Use  . Vaping Use: Never used  Substance Use Topics  . Alcohol use: Not Currently  . Drug use: No     Medication list has been reviewed and updated.  Current Meds  Medication Sig  . Acetaminophen (TYLENOL ARTHRITIS PAIN PO) Take 1 capsule by mouth 2 (two) times daily.  Marland Kitchen aspirin 81 MG chewable tablet Chew by mouth daily.  . cetirizine (ZYRTEC) 10 MG tablet Take 10 mg by mouth as needed.   . cyanocobalamin 1000 MCG tablet Take 1 tablet by mouth daily.  . Dulaglutide (TRULICITY) 1.5 NI/6.2VO SOPN Inject 1.5 mg into the skin once a week.  Marland Kitchen glucose blood (ACCU-CHEK AVIVA) test strip   . lisinopril-hydrochlorothiazide (ZESTORETIC) 20-12.5 MG tablet Take 1 tablet by mouth daily.  . metFORMIN (GLUCOPHAGE) 1000 MG tablet TAKE 1 TABLET TWICE A DAY  . Multiple Vitamins-Minerals (MULTIVITAMIN WITH MINERALS) tablet Take 1 tablet by mouth daily.  . naproxen sodium (ANAPROX) 220 MG tablet Take 220 mg by mouth 2 (two) times daily with a meal. PRN  . Omega 3-6-9 Fatty Acids (OMEGA 3-6-9 COMPLEX PO) Take 1,600 mg by mouth daily.  . Pyridoxine HCl (VITAMIN B6) 200 MG TABS Take 1 tablet by mouth 2 (two) times daily.   . simvastatin (ZOCOR) 20 MG tablet Take 1 tablet (20 mg total) by mouth daily.  . traZODone (DESYREL) 50 MG tablet Take 0.5 tablets (25 mg total) by mouth at bedtime as needed for sleep. (Patient taking differently: Take 25 mg by mouth at bedtime as needed for sleep. Pt takes very rarely)  . Turmeric (QC TUMERIC COMPLEX PO) Take 1 tablet by mouth daily.    PHQ 2/9 Scores 11/06/2019 07/15/2019 07/07/2019 03/12/2019  PHQ - 2 Score 0 0 0 0  PHQ- 9 Score 0 1 - 0    GAD 7 : Generalized Anxiety Score 11/06/2019 07/15/2019  Nervous, Anxious, on Edge 0 0  Control/stop worrying 0 0  Worry too much - different things 0 0  Trouble relaxing 0 0  Restless 0 0  Easily annoyed or irritable 0 0   Afraid - awful might happen 0 0  Total GAD 7 Score 0 0  Anxiety Difficulty Not difficult at all Not difficult at all    BP Readings from Last 3 Encounters:  11/06/19 120/78  09/17/19 (!) 144/68  07/15/19 104/70    Physical Exam Vitals and nursing note reviewed.  Constitutional:      General: She is not in acute distress.    Appearance: Normal appearance. She is well-developed.  HENT:     Head: Normocephalic and atraumatic.  Cardiovascular:     Rate and Rhythm: Normal rate and regular rhythm.     Pulses: Normal pulses.     Heart sounds: No murmur heard.   Pulmonary:     Effort: Pulmonary effort is normal. No respiratory distress.     Breath sounds: No wheezing or rhonchi.  Musculoskeletal:  Cervical back: Normal range of motion.     Right lower leg: No edema.     Left lower leg: No edema.  Lymphadenopathy:     Cervical: No cervical adenopathy.  Skin:    General: Skin is warm and dry.     Capillary Refill: Capillary refill takes less than 2 seconds.     Findings: No rash.  Neurological:     General: No focal deficit present.     Mental Status: She is alert and oriented to person, place, and time.     Gait: Gait abnormal (uses rolator).  Psychiatric:        Mood and Affect: Mood normal.     Wt Readings from Last 3 Encounters:  11/06/19 229 lb (103.9 kg)  09/17/19 228 lb (103.4 kg)  07/15/19 228 lb (103.4 kg)    BP 120/78   Pulse 85   Temp 98 F (36.7 C) (Oral)   Ht 5\' 3"  (1.6 m)   Wt 229 lb (103.9 kg)   SpO2 97%   BMI 40.57 kg/m   Assessment and Plan: 1. Essential (primary) hypertension Clinically stable exam with well controlled BP. Tolerating medications without side effects at this time. Pt to continue current regimen and low sodium diet; benefits of regular exercise as able discussed.  2. Type II diabetes mellitus with complication ([place]) Tolerating trulicity and metformin fairly well.  Some mild fecal urgency after eating salads that is new  since starting Trulicity. Weight is stable - she feels that Trulicity increases her appetite but her weight is stable. She is satisfied to continue the current therapy for now. - Hemoglobin G8Z - Basic metabolic panel  3. Hyperlipidemia associated with type 2 diabetes mellitus ([place]) On statin therapy   Partially dictated using Editor, commissioning. Any errors are unintentional.  Halina Maidens, MD [facility]  11/06/2019

## 2019-11-07 LAB — HEMOGLOBIN A1C
Est. average glucose Bld gHb Est-mCnc: 177 mg/dL
Hgb A1c MFr Bld: 7.8 % — ABNORMAL HIGH (ref 4.8–5.6)

## 2019-11-07 LAB — BASIC METABOLIC PANEL
BUN/Creatinine Ratio: 27 (ref 12–28)
BUN: 20 mg/dL (ref 8–27)
CO2: 24 mmol/L (ref 20–29)
Calcium: 10.5 mg/dL — ABNORMAL HIGH (ref 8.7–10.3)
Chloride: 99 mmol/L (ref 96–106)
Creatinine, Ser: 0.75 mg/dL (ref 0.57–1.00)
GFR calc Af Amer: 92 mL/min/{1.73_m2} (ref >59)
GFR calc non Af Amer: 80 mL/min/{1.73_m2} (ref >59)
Glucose: 151 mg/dL — ABNORMAL HIGH (ref 65–99)
Potassium: 4.7 mmol/L (ref 3.5–5.2)
Sodium: 140 mmol/L (ref 134–144)

## 2020-01-05 DIAGNOSIS — Z23 Encounter for immunization: Secondary | ICD-10-CM | POA: Diagnosis not present

## 2020-02-01 ENCOUNTER — Other Ambulatory Visit: Payer: Self-pay | Admitting: Internal Medicine

## 2020-02-01 DIAGNOSIS — E118 Type 2 diabetes mellitus with unspecified complications: Secondary | ICD-10-CM

## 2020-03-09 ENCOUNTER — Ambulatory Visit (INDEPENDENT_AMBULATORY_CARE_PROVIDER_SITE_OTHER): Payer: Medicare Other | Admitting: Internal Medicine

## 2020-03-09 ENCOUNTER — Encounter: Payer: Self-pay | Admitting: Internal Medicine

## 2020-03-09 ENCOUNTER — Other Ambulatory Visit: Payer: Self-pay

## 2020-03-09 VITALS — BP 132/88 | HR 97 | Ht 63.0 in | Wt 226.0 lb

## 2020-03-09 DIAGNOSIS — I10 Essential (primary) hypertension: Secondary | ICD-10-CM | POA: Diagnosis not present

## 2020-03-09 DIAGNOSIS — H9313 Tinnitus, bilateral: Secondary | ICD-10-CM | POA: Diagnosis not present

## 2020-03-09 DIAGNOSIS — I700 Atherosclerosis of aorta: Secondary | ICD-10-CM

## 2020-03-09 DIAGNOSIS — E118 Type 2 diabetes mellitus with unspecified complications: Secondary | ICD-10-CM | POA: Diagnosis not present

## 2020-03-09 DIAGNOSIS — E785 Hyperlipidemia, unspecified: Secondary | ICD-10-CM | POA: Diagnosis not present

## 2020-03-09 DIAGNOSIS — Z6841 Body Mass Index (BMI) 40.0 and over, adult: Secondary | ICD-10-CM

## 2020-03-09 DIAGNOSIS — E1169 Type 2 diabetes mellitus with other specified complication: Secondary | ICD-10-CM | POA: Diagnosis not present

## 2020-03-09 LAB — POCT GLYCOSYLATED HEMOGLOBIN (HGB A1C): Hemoglobin A1C: 7.8 % — AB (ref 4.0–5.6)

## 2020-03-09 NOTE — Progress Notes (Signed)
Date:  03/09/2020   Name:  Nancy Chen   DOB:  Jul 21, 1945   MRN:  [id number]   Chief Complaint: Hypertension, Diabetes, and tinnitis (Wants to discuss if vitamin can help with ringing in ears. )  Hypertension This is a chronic problem. The problem is controlled (does not check at home). Pertinent negatives include no chest pain, headaches, palpitations or shortness of breath. Past treatments include ACE inhibitors and diuretics. There is no history of CAD/MI or CVA.  Diabetes She presents for her follow-up diabetic visit. She has type 2 diabetes mellitus. Her disease course has been stable. Pertinent negatives for hypoglycemia include no headaches or tremors. Pertinent negatives for diabetes include no chest pain, no fatigue, no polydipsia and no polyuria. Pertinent negatives for diabetic complications include no CVA. Current diabetic treatment includes oral agent (dual therapy) (metformin and trulicity). She is compliant with treatment all of the time. There is no change in her home blood glucose trend. An ACE inhibitor/angiotensin II receptor blocker is being taken.  Hyperlipidemia This is a chronic problem. The problem is controlled. Pertinent negatives include no chest pain or shortness of breath. Current antihyperlipidemic treatment includes statins. The current treatment provides significant (trigs are still high) improvement of lipids.  tinnitus - chronic since head injury.  ENT evaluation showed mild hearing loss.  She has a bioflavenoid supplement that she would like to try.  Lab Results  Component Value Date   CREATININE 0.75 11/06/2019   BUN 20 11/06/2019   NA 140 11/06/2019   K 4.7 11/06/2019   CL 99 11/06/2019   CO2 24 11/06/2019   Lab Results  Component Value Date   CHOL 164 07/15/2019   HDL 44 07/15/2019   LDLCALC 73 07/15/2019   TRIG 293 (H) 07/15/2019   CHOLHDL 3.7 07/15/2019   Lab Results  Component Value Date   TSH 1.030 07/15/2019   Lab Results   Component Value Date   HGBA1C 7.8 (H) 11/06/2019   Lab Results  Component Value Date   WBC 6.0 07/15/2019   HGB 13.7 07/15/2019   HCT 41.7 07/15/2019   MCV 91 07/15/2019   PLT 240 07/15/2019   Lab Results  Component Value Date   ALT 32 07/15/2019   AST 33 07/15/2019   ALKPHOS 50 07/15/2019   BILITOT 0.4 07/15/2019     Review of Systems  Constitutional: Negative for appetite change, fatigue, fever and unexpected weight change.  HENT: Positive for tinnitus. Negative for trouble swallowing.   Eyes: Negative for visual disturbance.  Respiratory: Negative for cough, chest tightness and shortness of breath.   Cardiovascular: Negative for chest pain, palpitations and leg swelling.  Gastrointestinal: Negative for abdominal pain.  Endocrine: Negative for polydipsia and polyuria.  Genitourinary: Negative for dysuria and hematuria.  Musculoskeletal: Positive for arthralgias.  Neurological: Negative for tremors, numbness and headaches.  Psychiatric/Behavioral: Negative for dysphoric mood.    Patient Active Problem List   Diagnosis Date Noted  . Aortic atherosclerosis ([place]) 07/15/2019  . BMI 40.0-44.9, adult ([place]) 03/12/2019  . Strain of right shoulder 07/05/2017  . Facial basal cell cancer 10/31/2016  . Migraine 10/31/2016  . Sleep disorder 02/04/2016  . Lipoma of left upper extremity 10/11/2015  . Type II diabetes mellitus with complication ([place]) 30/16/0109  . Ovarian failure 11/10/2014  . Essential (primary) hypertension 07/15/2014  . Hyperlipidemia associated with type 2 diabetes mellitus ([place]) 07/15/2014  . Dermatophytosis of groin 07/15/2014  . Tricompartment osteoarthritis of knee 07/15/2014  Allergies  Allergen Reactions  . Glipizide Swelling    hives  . Codeine Hives    Past Surgical History:  Procedure Laterality Date  . ABDOMINAL HYSTERECTOMY    . BASAL CELL CARCINOMA EXCISION    . CARPAL TUNNEL RELEASE Bilateral   . CARPAL TUNNEL RELEASE    .  CATARACT EXTRACTION W/PHACO Left 09/07/2016   Procedure: CATARACT EXTRACTION PHACO AND INTRAOCULAR LENS PLACEMENT (IOC);  Surgeon: Eulogio Bear, MD;  Location: [facility] ORS;  Service: Ophthalmology;  Laterality: Left;  [place] 00:41.9 AP% 14.9 CDE 6.23 Fluid pack lot # [id number]  . CATARACT EXTRACTION W/PHACO Right 10/19/2016   Procedure: CATARACT EXTRACTION PHACO AND INTRAOCULAR LENS PLACEMENT ([place]);  Surgeon: Eulogio Bear, MD;  Location: [facility] ORS;  Service: Ophthalmology;  Laterality: Right;  casette lot # [id number] H [place]  00:59.5 AP%   16.5 CDE   9.79   . COLONOSCOPY  06/2012   normal (in [place])  . TONSILLECTOMY      Social History   Tobacco Use  . Smoking status: Never Smoker  . Smokeless tobacco: Never Used  . Tobacco comment: smoking cessation materials not required  Vaping Use  . Vaping Use: Never used  Substance Use Topics  . Alcohol use: Not Currently  . Drug use: No     Medication list has been reviewed and updated.  Current Meds  Medication Sig  . Acetaminophen (TYLENOL ARTHRITIS PAIN PO) Take 1 capsule by mouth 2 (two) times daily.  Marland Kitchen aspirin 81 MG chewable tablet Chew by mouth daily.  . cetirizine (ZYRTEC) 10 MG tablet Take 10 mg by mouth as needed.   . cyanocobalamin 1000 MCG tablet Take 1 tablet by mouth daily.  Marland Kitchen glucose blood test strip   . lisinopril-hydrochlorothiazide (ZESTORETIC) 20-12.5 MG tablet TAKE 1 TABLET DAILY  . metFORMIN (GLUCOPHAGE) 1000 MG tablet TAKE 1 TABLET TWICE A DAY  . Multiple Vitamins-Minerals (MULTIVITAMIN WITH MINERALS) tablet Take 1 tablet by mouth daily.  . naproxen sodium (ANAPROX) 220 MG tablet Take 220 mg by mouth 2 (two) times daily with a meal. PRN  . Omega 3-6-9 Fatty Acids (OMEGA 3-6-9 COMPLEX PO) Take 1,600 mg by mouth daily.  . Pyridoxine HCl (VITAMIN B6) 200 MG TABS Take 1 tablet by mouth 2 (two) times daily.   . simvastatin (ZOCOR) 20 MG tablet TAKE 1 TABLET DAILY  . traZODone (DESYREL) 50 MG tablet Take 0.5 tablets (25 mg  total) by mouth at bedtime as needed for sleep. (Patient taking differently: Take 25 mg by mouth at bedtime as needed for sleep. Pt takes very rarely)  . TRULICITY 1.5 GD/9.2EQ SOPN INJECT 0.5ML (=1.5 MG)     SUBCUTANEOUSLY ONCE WEEKLY  . Turmeric (QC TUMERIC COMPLEX PO) Take 1 tablet by mouth daily.    PHQ 2/9 Scores 03/09/2020 11/06/2019 07/15/2019 07/07/2019  PHQ - 2 Score 0 0 0 0  PHQ- 9 Score 0 0 1 -    GAD 7 : Generalized Anxiety Score 03/09/2020 11/06/2019 07/15/2019  Nervous, Anxious, on Edge 0 0 0  Control/stop worrying 0 0 0  Worry too much - different things 0 0 0  Trouble relaxing 0 0 0  Restless 0 0 0  Easily annoyed or irritable 0 0 0  Afraid - awful might happen 0 0 0  Total GAD 7 Score 0 0 0  Anxiety Difficulty Not difficult at all Not difficult at all Not difficult at all    BP Readings from Last 3 Encounters:  03/09/20 132/88  11/06/19 120/78  09/17/19 (!) 144/68    Physical Exam Vitals and nursing note reviewed.  Constitutional:      General: She is not in acute distress.    Appearance: She is well-developed.  HENT:     Head: Normocephalic and atraumatic.     Right Ear: External ear normal.     Left Ear: External ear normal.     Ears:     Comments: Moderate cerumen bilaterally Cardiovascular:     Rate and Rhythm: Normal rate and regular rhythm.     Pulses:          Popliteal pulses are 2+ on the right side and 2+ on the left side.       Dorsalis pedis pulses are 1+ on the right side and 1+ on the left side.     Heart sounds: No murmur heard.   Pulmonary:     Effort: Pulmonary effort is normal. No respiratory distress.     Breath sounds: No wheezing or rhonchi.  Musculoskeletal:     Cervical back: Normal range of motion.     Right lower leg: No edema.     Left lower leg: No edema.  Lymphadenopathy:     Cervical: No cervical adenopathy.  Skin:    General: Skin is warm and dry.     Capillary Refill: Capillary refill takes less than 2 seconds.      Findings: No rash.  Neurological:     General: No focal deficit present.     Mental Status: She is alert and oriented to person, place, and time.  Psychiatric:        Mood and Affect: Mood normal.        Behavior: Behavior normal.     Wt Readings from Last 3 Encounters:  03/09/20 226 lb (102.5 kg)  11/06/19 229 lb (103.9 kg)  09/17/19 228 lb (103.4 kg)    BP 132/88   Pulse 97   Ht 5\' 3"  (1.6 m)   Wt 226 lb (102.5 kg)   SpO2 100%   BMI 40.03 kg/m   Assessment and Plan: 1. Essential (primary) hypertension Clinically stable exam with well controlled BP. Tolerating medications without side effects at this time. Pt to continue current regimen and low sodium diet; benefits of regular exercise as able discussed.  2. Type II diabetes mellitus with complication ([place]) Clinically stable by exam and report without s/s of hypoglycemia. DM complicated by HTN. She is gradually losing some weight Tolerating medications - Trulicity and metformin well without side effects or other concerns. - POCT glycosylated hemoglobin (Hb A1C) = 7.8  3. Hyperlipidemia associated with type 2 diabetes mellitus ([place]) On statin medication  Last LDL 73  4. Tinnitus of both ears Can try the bio-flavenoid suppement  5. Aortic atherosclerosis ([facility]) Continue statin therapy with goal LDL < 70  6. BMI 40.0-44.9, adult ([place]) Continue exercise and efforts at weight loss   Partially dictated using Editor, commissioning. Any errors are unintentional.  Halina Maidens, MD [facility]  03/09/2020

## 2020-04-26 ENCOUNTER — Telehealth: Payer: Self-pay

## 2020-04-26 NOTE — Telephone Encounter (Signed)
Patient was told to go to urgent care or ER for treatment of shortness of breath and cant breath. Patient refused and ask when was dr berglunds next appointment.

## 2020-04-26 NOTE — Telephone Encounter (Signed)
Copied from [place] [id number]. Topic: General - Inquiry >> Apr 26, 2020  3:28 PM Loma [place] wrote: Reason for CRM: pt called stating congestion so bad and wheezing, chest bone hurts, constant sneezing tightness, headaches, no fever, wants to come into office, knows this is trouble for the FU at [phone]

## 2020-04-26 NOTE — Telephone Encounter (Signed)
Please call pt to schedule an appt of pt is having shortness of breath or cant breath pt needs to go to ED or UC.  KP

## 2020-04-27 ENCOUNTER — Other Ambulatory Visit: Payer: Self-pay

## 2020-04-27 ENCOUNTER — Ambulatory Visit (INDEPENDENT_AMBULATORY_CARE_PROVIDER_SITE_OTHER): Payer: Medicare Other | Admitting: Internal Medicine

## 2020-04-27 ENCOUNTER — Encounter: Payer: Self-pay | Admitting: Internal Medicine

## 2020-04-27 VITALS — BP 114/72 | HR 104 | Temp 98.6°F | Ht 63.0 in | Wt 226.0 lb

## 2020-04-27 DIAGNOSIS — J40 Bronchitis, not specified as acute or chronic: Secondary | ICD-10-CM | POA: Diagnosis not present

## 2020-04-27 MED ORDER — AMOXICILLIN-POT CLAVULANATE 875-125 MG PO TABS: 1.0000 | ORAL_TABLET | Freq: Two times a day (BID) | ORAL | 0 refills | Status: AC

## 2020-04-27 MED ORDER — ALBUTEROL SULFATE HFA 108 (90 BASE) MCG/ACT IN AERS: 2.0000 | INHALATION_SPRAY | Freq: Four times a day (QID) | RESPIRATORY_TRACT | 0 refills | Status: DC | PRN

## 2020-04-27 MED ORDER — PROMETHAZINE-DM 6.25-15 MG/5ML PO SYRP: 5.0000 mL | ORAL_SOLUTION | Freq: Four times a day (QID) | ORAL | 0 refills | Status: DC | PRN

## 2020-04-27 NOTE — Progress Notes (Signed)
Date:  04/27/2020   Name:  Nancy Chen   DOB:  24-Sep-1945   MRN:  [id number]   Chief Complaint: Cough (Started Sunday and symptoms are not worse. Coughing with wheezing. Chest is sore from cough. Worse at night. No fever. Slightly short of breathe. Not sleeping due to cough. Burning in throat from cough. Sneezing. Covid test at home yesterday was negative.)  Cough This is a new problem. The current episode started in the past 7 days. The problem has been gradually worsening. The problem occurs every few minutes. Associated symptoms include headaches, shortness of breath and wheezing (mostly at night). Pertinent negatives include no chest pain, chills, fever or sore throat. Her past medical history is significant for environmental allergies.    Lab Results  Component Value Date   CREATININE 0.75 11/06/2019   BUN 20 11/06/2019   NA 140 11/06/2019   K 4.7 11/06/2019   CL 99 11/06/2019   CO2 24 11/06/2019   Lab Results  Component Value Date   CHOL 164 07/15/2019   HDL 44 07/15/2019   LDLCALC 73 07/15/2019   TRIG 293 (H) 07/15/2019   CHOLHDL 3.7 07/15/2019   Lab Results  Component Value Date   TSH 1.030 07/15/2019   Lab Results  Component Value Date   HGBA1C 7.8 (A) 03/09/2020   Lab Results  Component Value Date   WBC 6.0 07/15/2019   HGB 13.7 07/15/2019   HCT 41.7 07/15/2019   MCV 91 07/15/2019   PLT 240 07/15/2019   Lab Results  Component Value Date   ALT 32 07/15/2019   AST 33 07/15/2019   ALKPHOS 50 07/15/2019   BILITOT 0.4 07/15/2019     Review of Systems  Constitutional: Negative for chills and fever.  HENT: Positive for congestion, sinus pressure, sneezing and tinnitus. Negative for sore throat and trouble swallowing.   Respiratory: Positive for cough, shortness of breath and wheezing (mostly at night).   Cardiovascular: Negative for chest pain and palpitations.  Allergic/Immunologic: Positive for environmental allergies.  Neurological: Positive for  headaches. Negative for dizziness and light-headedness.    Patient Active Problem List   Diagnosis Date Noted  . Aortic atherosclerosis ([place]) 07/15/2019  . BMI 40.0-44.9, adult ([place]) 03/12/2019  . Strain of right shoulder 07/05/2017  . Facial basal cell cancer 10/31/2016  . Migraine 10/31/2016  . Sleep disorder 02/04/2016  . Lipoma of left upper extremity 10/11/2015  . Type II diabetes mellitus with complication ([place]) 14/97/0263  . Ovarian failure 11/10/2014  . Essential (primary) hypertension 07/15/2014  . Hyperlipidemia associated with type 2 diabetes mellitus ([place]) 07/15/2014  . Dermatophytosis of groin 07/15/2014  . Tricompartment osteoarthritis of knee 07/15/2014    Allergies  Allergen Reactions  . Glipizide Swelling    hives  . Codeine Hives    Past Surgical History:  Procedure Laterality Date  . ABDOMINAL HYSTERECTOMY    . BASAL CELL CARCINOMA EXCISION    . CARPAL TUNNEL RELEASE Bilateral   . CARPAL TUNNEL RELEASE    . CATARACT EXTRACTION W/PHACO Left 09/07/2016   Procedure: CATARACT EXTRACTION PHACO AND INTRAOCULAR LENS PLACEMENT (IOC);  Surgeon: Eulogio Bear, MD;  Location: [facility] ORS;  Service: Ophthalmology;  Laterality: Left;  [place] 00:41.9 AP% 14.9 CDE 6.23 Fluid pack lot # [id number]  . CATARACT EXTRACTION W/PHACO Right 10/19/2016   Procedure: CATARACT EXTRACTION PHACO AND INTRAOCULAR LENS PLACEMENT ([place]);  Surgeon: Eulogio Bear, MD;  Location: [facility] ORS;  Service: Ophthalmology;  Laterality: Right;  casette lot # [id number] H [place]  00:59.5 AP%   16.5 CDE   9.79   . COLONOSCOPY  06/2012   normal (in [place])  . TONSILLECTOMY      Social History   Tobacco Use  . Smoking status: Never Smoker  . Smokeless tobacco: Never Used  . Tobacco comment: smoking cessation materials not required  Vaping Use  . Vaping Use: Never used  Substance Use Topics  . Alcohol use: Not Currently  . Drug use: No     Medication list has been reviewed and updated.  Current  Meds  Medication Sig  . Acetaminophen (TYLENOL ARTHRITIS PAIN PO) Take 1 capsule by mouth 2 (two) times daily.  Marland Kitchen aspirin 81 MG chewable tablet Chew by mouth daily.  . cetirizine (ZYRTEC) 10 MG tablet Take 10 mg by mouth as needed.   . cyanocobalamin 1000 MCG tablet Take 1 tablet by mouth daily.  Marland Kitchen glucose blood test strip   . lisinopril-hydrochlorothiazide (ZESTORETIC) 20-12.5 MG tablet TAKE 1 TABLET DAILY  . metFORMIN (GLUCOPHAGE) 1000 MG tablet TAKE 1 TABLET TWICE A DAY  . Multiple Vitamins-Minerals (MULTIVITAMIN WITH MINERALS) tablet Take 1 tablet by mouth daily.  . naproxen sodium (ANAPROX) 220 MG tablet Take 220 mg by mouth 2 (two) times daily with a meal. PRN  . Omega 3-6-9 Fatty Acids (OMEGA 3-6-9 COMPLEX PO) Take 1,600 mg by mouth daily.  . Pyridoxine HCl (VITAMIN B6) 200 MG TABS Take 1 tablet by mouth 2 (two) times daily.   . simvastatin (ZOCOR) 20 MG tablet TAKE 1 TABLET DAILY  . TRULICITY 1.5 UV/2.5DG SOPN INJECT 0.5ML (=1.5 MG)     SUBCUTANEOUSLY ONCE WEEKLY  . Turmeric (QC TUMERIC COMPLEX PO) Take 1 tablet by mouth daily.    PHQ 2/9 Scores 04/27/2020 03/09/2020 11/06/2019 07/15/2019  PHQ - 2 Score 0 0 0 0  PHQ- 9 Score 0 0 0 1    GAD 7 : Generalized Anxiety Score 04/27/2020 03/09/2020 11/06/2019 07/15/2019  Nervous, Anxious, on Edge 0 0 0 0  Control/stop worrying 0 0 0 0  Worry too much - different things 0 0 0 0  Trouble relaxing 0 0 0 0  Restless 0 0 0 0  Easily annoyed or irritable 0 0 0 0  Afraid - awful might happen 0 0 0 0  Total GAD 7 Score 0 0 0 0  Anxiety Difficulty Not difficult at all Not difficult at all Not difficult at all Not difficult at all    BP Readings from Last 3 Encounters:  04/27/20 114/72  03/09/20 132/88  11/06/19 120/78    Physical Exam Vitals and nursing note reviewed.  Constitutional:      General: She is not in acute distress.    Appearance: She is well-developed. She is ill-appearing.  HENT:     Head: Normocephalic and atraumatic.      Right Ear: Tympanic membrane and ear canal normal.     Left Ear: Tympanic membrane and ear canal normal.     Nose:     Right Sinus: No maxillary sinus tenderness or frontal sinus tenderness.     Left Sinus: No maxillary sinus tenderness or frontal sinus tenderness.     Mouth/Throat:     Pharynx: Posterior oropharyngeal erythema present. No oropharyngeal exudate.  Cardiovascular:     Rate and Rhythm: Normal rate and regular rhythm.     Pulses: Normal pulses.     Heart sounds: No murmur heard.   Pulmonary:  Effort: Pulmonary effort is normal. No respiratory distress.     Breath sounds: Transmitted upper airway sounds present. No wheezing or rhonchi.     Comments: Coarse breath sounds throughout Musculoskeletal:     Cervical back: Normal range of motion.  Lymphadenopathy:     Cervical: No cervical adenopathy.  Skin:    General: Skin is warm and dry.     Findings: No rash.  Neurological:     Mental Status: She is alert and oriented to person, place, and time.  Psychiatric:        Mood and Affect: Mood normal.        Behavior: Behavior normal.     Wt Readings from Last 3 Encounters:  04/27/20 226 lb (102.5 kg)  03/09/20 226 lb (102.5 kg)  11/06/19 229 lb (103.9 kg)    BP 114/72   Pulse (!) 104   Temp 98.6 F (37 C) (Oral)   Ht 5\' 3"  (1.6 m)   Wt 226 lb (102.5 kg)   SpO2 96%   BMI 40.03 kg/m   Assessment and Plan: 1. Bronchitis Hx of recurrent episodes in adulthood Push fluids and follow up as needed - albuterol (VENTOLIN HFA) 108 (90 Base) MCG/ACT inhaler; Inhale 2 puffs into the lungs every 6 (six) hours as needed for wheezing or shortness of breath.  Dispense: 1 each; Refill: 0 - amoxicillin-clavulanate (AUGMENTIN) 875-125 MG tablet; Take 1 tablet by mouth 2 (two) times daily for 10 days.  Dispense: 20 tablet; Refill: 0 - promethazine-dextromethorphan (PROMETHAZINE-DM) 6.25-15 MG/5ML syrup; Take 5 mLs by mouth 4 (four) times daily as needed for cough.  Dispense:  240 mL; Refill: 0   Partially dictated using Editor, commissioning. Any errors are unintentional.  Halina Maidens, MD [facility]  04/27/2020

## 2020-04-28 ENCOUNTER — Other Ambulatory Visit: Payer: Self-pay | Admitting: Internal Medicine

## 2020-06-30 DIAGNOSIS — Z86018 Personal history of other benign neoplasm: Secondary | ICD-10-CM | POA: Diagnosis not present

## 2020-06-30 DIAGNOSIS — Z85828 Personal history of other malignant neoplasm of skin: Secondary | ICD-10-CM | POA: Diagnosis not present

## 2020-06-30 DIAGNOSIS — Z872 Personal history of diseases of the skin and subcutaneous tissue: Secondary | ICD-10-CM | POA: Diagnosis not present

## 2020-06-30 DIAGNOSIS — C4441 Basal cell carcinoma of skin of scalp and neck: Secondary | ICD-10-CM | POA: Diagnosis not present

## 2020-06-30 DIAGNOSIS — L578 Other skin changes due to chronic exposure to nonionizing radiation: Secondary | ICD-10-CM | POA: Diagnosis not present

## 2020-06-30 DIAGNOSIS — D485 Neoplasm of uncertain behavior of skin: Secondary | ICD-10-CM | POA: Diagnosis not present

## 2020-06-30 DIAGNOSIS — Z859 Personal history of malignant neoplasm, unspecified: Secondary | ICD-10-CM | POA: Diagnosis not present

## 2020-06-30 DIAGNOSIS — L570 Actinic keratosis: Secondary | ICD-10-CM | POA: Diagnosis not present

## 2020-07-07 ENCOUNTER — Ambulatory Visit (INDEPENDENT_AMBULATORY_CARE_PROVIDER_SITE_OTHER): Payer: Medicare Other

## 2020-07-07 DIAGNOSIS — Z1231 Encounter for screening mammogram for malignant neoplasm of breast: Secondary | ICD-10-CM | POA: Diagnosis not present

## 2020-07-07 DIAGNOSIS — Z0000 Encounter for general adult medical examination without abnormal findings: Secondary | ICD-10-CM

## 2020-07-07 NOTE — Patient Instructions (Signed)
Nancy Chen , Thank you for taking time to come for your Medicare Wellness Visit. I appreciate your ongoing commitment to your health goals. Please review the following plan we discussed and let me know if I can assist you in the future.   Screening recommendations/referrals: Colonoscopy: done 06/23/12. Repeat in 2024 Mammogram: done 07/22/19. Please call [phone] to schedule your mammogram.  Bone Density: done 08/11/19 Recommended yearly ophthalmology/optometry visit for glaucoma screening and checkup Recommended yearly dental visit for hygiene and checkup  Vaccinations: Influenza vaccine: done 10/13/19 Pneumococcal vaccine: done 06/26/16 Tdap vaccine: done 03/09/12 Shingles vaccine: done 07/09/17 & 09/25/17   Covid-19:done 03/10/19, 03/30/19 & 01/05/20  Conditions/risks identified: Recommend increasing physical activity to at least 3 days per week   Next appointment: Follow up in one year for your annual wellness visit    Preventive Care 17 Years and Older, Female Preventive care refers to lifestyle choices and visits with your health care provider that can promote health and wellness. What does preventive care include? A yearly physical exam. This is also called an annual well check. Dental exams once or twice a year. Routine eye exams. Ask your health care provider how often you should have your eyes checked. Personal lifestyle choices, including: Daily care of your teeth and gums. Regular physical activity. Eating a healthy diet. Avoiding tobacco and drug use. Limiting alcohol use. Practicing safe sex. Taking low-dose aspirin every day. Taking vitamin and mineral supplements as recommended by your health care provider. What happens during an annual well check? The services and screenings done by your health care provider during your annual well check will depend on your age, overall health, lifestyle risk factors, and family history of disease. Counseling  Your health care provider  may ask you questions about your: Alcohol use. Tobacco use. Drug use. Emotional well-being. Home and relationship well-being. Sexual activity. Eating habits. History of falls. Memory and ability to understand (cognition). Work and work Statistician. Reproductive health. Screening  You may have the following tests or measurements: Height, weight, and BMI. Blood pressure. Lipid and cholesterol levels. These may be checked every 5 years, or more frequently if you are over 14 years old. Skin check. Lung cancer screening. You may have this screening every year starting at age 39 if you have a 30-pack-year history of smoking and currently smoke or have quit within the past 15 years. Fecal occult blood test (FOBT) of the stool. You may have this test every year starting at age 56. Flexible sigmoidoscopy or colonoscopy. You may have a sigmoidoscopy every 5 years or a colonoscopy every 10 years starting at age 26. Hepatitis C blood test. Hepatitis B blood test. Sexually transmitted disease (STD) testing. Diabetes screening. This is done by checking your blood sugar (glucose) after you have not eaten for a while (fasting). You may have this done every 1-3 years. Bone density scan. This is done to screen for osteoporosis. You may have this done starting at age 75. Mammogram. This may be done every 1-2 years. Talk to your health care provider about how often you should have regular mammograms. Talk with your health care provider about your test results, treatment options, and if necessary, the need for more tests. Vaccines  Your health care provider may recommend certain vaccines, such as: Influenza vaccine. This is recommended every year. Tetanus, diphtheria, and acellular pertussis (Tdap, Td) vaccine. You may need a Td booster every 10 years. Zoster vaccine. You may need this after age 82. Pneumococcal 13-valent conjugate (PCV13)  vaccine. One dose is recommended after age 18. Pneumococcal  polysaccharide (PPSV23) vaccine. One dose is recommended after age 26. Talk to your health care provider about which screenings and vaccines you need and how often you need them. This information is not intended to replace advice given to you by your health care provider. Make sure you discuss any questions you have with your health care provider. Document Released: 01/22/2015 Document Revised: 09/15/2015 Document Reviewed: 10/27/2014 Elsevier Interactive Patient Education  2017 [place] Prevention in the Home Falls can cause injuries. They can happen to people of all ages. There are many things you can do to make your home safe and to help prevent falls. What can I do on the outside of my home? Regularly fix the edges of walkways and driveways and fix any cracks. Remove anything that might make you trip as you walk through a door, such as a raised step or threshold. Trim any bushes or trees on the path to your home. Use bright outdoor lighting. Clear any walking paths of anything that might make someone trip, such as rocks or tools. Regularly check to see if handrails are loose or broken. Make sure that both sides of any steps have handrails. Any raised decks and porches should have guardrails on the edges. Have any leaves, snow, or ice cleared regularly. Use sand or salt on walking paths during winter. Clean up any spills in your garage right away. This includes oil or grease spills. What can I do in the bathroom? Use night lights. Install grab bars by the toilet and in the tub and shower. Do not use towel bars as grab bars. Use non-skid mats or decals in the tub or shower. If you need to sit down in the shower, use a plastic, non-slip stool. Keep the floor dry. Clean up any water that spills on the floor as soon as it happens. Remove soap buildup in the tub or shower regularly. Attach bath mats securely with double-sided non-slip rug tape. Do not have throw rugs and other  things on the floor that can make you trip. What can I do in the bedroom? Use night lights. Make sure that you have a light by your bed that is easy to reach. Do not use any sheets or blankets that are too big for your bed. They should not hang down onto the floor. Have a firm chair that has side arms. You can use this for support while you get dressed. Do not have throw rugs and other things on the floor that can make you trip. What can I do in the kitchen? Clean up any spills right away. Avoid walking on wet floors. Keep items that you use a lot in easy-to-reach places. If you need to reach something above you, use a strong step stool that has a grab bar. Keep electrical cords out of the way. Do not use floor polish or wax that makes floors slippery. If you must use wax, use non-skid floor wax. Do not have throw rugs and other things on the floor that can make you trip. What can I do with my stairs? Do not leave any items on the stairs. Make sure that there are handrails on both sides of the stairs and use them. Fix handrails that are broken or loose. Make sure that handrails are as long as the stairways. Check any carpeting to make sure that it is firmly attached to the stairs. Fix any carpet that is loose  or worn. Avoid having throw rugs at the top or bottom of the stairs. If you do have throw rugs, attach them to the floor with carpet tape. Make sure that you have a light switch at the top of the stairs and the bottom of the stairs. If you do not have them, ask someone to add them for you. What else can I do to help prevent falls? Wear shoes that: Do not have high heels. Have rubber bottoms. Are comfortable and fit you well. Are closed at the toe. Do not wear sandals. If you use a stepladder: Make sure that it is fully opened. Do not climb a closed stepladder. Make sure that both sides of the stepladder are locked into place. Ask someone to hold it for you, if possible. Clearly  mark and make sure that you can see: Any grab bars or handrails. First and last steps. Where the edge of each step is. Use tools that help you move around (mobility aids) if they are needed. These include: Canes. Walkers. Scooters. Crutches. Turn on the lights when you go into a dark area. Replace any light bulbs as soon as they burn out. Set up your furniture so you have a clear path. Avoid moving your furniture around. If any of your floors are uneven, fix them. If there are any pets around you, be aware of where they are. Review your medicines with your doctor. Some medicines can make you feel dizzy. This can increase your chance of falling. Ask your doctor what other things that you can do to help prevent falls. This information is not intended to replace advice given to you by your health care provider. Make sure you discuss any questions you have with your health care provider. Document Released: 10/22/2008 Document Revised: 06/03/2015 Document Reviewed: 01/30/2014 Elsevier Interactive Patient Education  2017 [employer].

## 2020-07-07 NOTE — Progress Notes (Signed)
Subjective:   Nancy Chen is a 75 y.o. female who presents for Medicare Annual (Subsequent) preventive examination.  Virtual Visit via Telephone Note  I connected with  Nancy Chen on 07/07/20 at 10:00 AM EDT by telephone and verified that I am speaking with the correct person using two identifiers.  Location: Patient: home Provider: [facility] Persons participating in the virtual visit: [place]   I discussed the limitations, risks, security and privacy concerns of performing an evaluation and management service by telephone and the availability of in person appointments. The patient expressed understanding and agreed to proceed.  Interactive audio and video telecommunications were attempted between this nurse and patient, however failed, due to patient having technical difficulties OR patient did not have access to video capability.  We continued and completed visit with audio only.  Some vital signs may be absent or patient reported.   Clemetine Marker, LPN   Review of Systems     Cardiac Risk Factors include: advanced age (>9men, >12 women);diabetes mellitus;dyslipidemia;hypertension;obesity (BMI >30kg/m2)     Objective:    There were no vitals filed for this visit. There is no height or weight on file to calculate BMI.  Advanced Directives 07/07/2020 07/07/2019 07/03/2018 07/02/2017 09/07/2016 06/26/2016 03/26/2015  Does Patient Have a Medical Advance Directive? Yes Yes Yes Yes Yes Yes Yes  Type of Paramedic of [place];Living will [place];Living will [place];Living will [place];Living will Living will;Healthcare Power of [place];Living will [place];Living will  Does patient want to make changes to medical advance directive? - - - - No - Patient declined - -  Copy of [place] in Chart? Yes - validated  most recent copy scanned in chart (See row information) Yes - validated most recent copy scanned in chart (See row information) Yes - validated most recent copy scanned in chart (See row information) Yes No - copy requested Yes -    Current Medications (verified) Outpatient Encounter Medications as of 07/07/2020  Medication Sig   Acetaminophen (TYLENOL ARTHRITIS PAIN PO) Take 1 capsule by mouth 2 (two) times daily.   albuterol (VENTOLIN HFA) 108 (90 Base) MCG/ACT inhaler Inhale 2 puffs into the lungs every 6 (six) hours as needed for wheezing or shortness of breath.   aspirin 81 MG chewable tablet Chew by mouth daily.   cetirizine (ZYRTEC) 10 MG tablet Take 10 mg by mouth as needed.    lisinopril-hydrochlorothiazide (ZESTORETIC) 20-12.5 MG tablet TAKE 1 TABLET DAILY   metFORMIN (GLUCOPHAGE) 1000 MG tablet TAKE 1 TABLET TWICE A DAY   naproxen sodium (ANAPROX) 220 MG tablet Take 220 mg by mouth 2 (two) times daily with a meal. PRN   Omega 3-6-9 Fatty Acids (OMEGA 3-6-9 COMPLEX PO) Take 1,600 mg by mouth daily.   Pyridoxine HCl (VITAMIN B6) 200 MG TABS Take 100 mg by mouth 2 (two) times daily.   simvastatin (ZOCOR) 20 MG tablet TAKE 1 TABLET DAILY   TRULICITY 1.5 WE/3.1VQ SOPN INJECT 0.5ML (=1.5 MG)     SUBCUTANEOUSLY ONCE WEEKLY   cyanocobalamin 1000 MCG tablet Take 1 tablet by mouth daily. (Patient not taking: Reported on 07/07/2020)   glucose blood test strip    Multiple Vitamins-Minerals (MULTIVITAMIN WITH MINERALS) tablet Take 1 tablet by mouth daily. (Patient not taking: Reported on 07/07/2020)   Turmeric (QC TUMERIC COMPLEX PO) Take 1 tablet by mouth daily. (Patient not taking: Reported  on 07/07/2020)   [DISCONTINUED] promethazine-dextromethorphan (PROMETHAZINE-DM) 6.25-15 MG/5ML syrup Take 5 mLs by mouth 4 (four) times daily as needed for cough.   No facility-administered encounter medications on file as of 07/07/2020.    Allergies (verified) Glipizide and Codeine   History: Past  Medical History:  Diagnosis Date   Allergy    Anemia    Came from menstrual issues   Anxiety    Arthritis    Bronchitis    Cataract 2018   COPD (chronic obstructive pulmonary disease) ([place])    Diabetes mellitus without complication ([place])    Facial basal cell cancer 10/31/2016   also left lower eyelid, chest and shoulder   Hypercholesteremia    Hypertension    Long-term use of high-risk medication 11/10/2014   Actos started 11/2014    Lower extremity edema    Past Surgical History:  Procedure Laterality Date   ABDOMINAL HYSTERECTOMY     BASAL CELL CARCINOMA EXCISION     CARPAL TUNNEL RELEASE Bilateral    CARPAL TUNNEL RELEASE     CATARACT EXTRACTION W/PHACO Left 09/07/2016   Procedure: CATARACT EXTRACTION PHACO AND INTRAOCULAR LENS PLACEMENT ([place]);  Surgeon: Eulogio Bear, MD;  Location: [facility] ORS;  Service: Ophthalmology;  Laterality: Left;  [place] 00:41.9 AP% 14.9 CDE 6.23 Fluid pack lot # [id number]   CATARACT EXTRACTION W/PHACO Right 10/19/2016   Procedure: CATARACT EXTRACTION PHACO AND INTRAOCULAR LENS PLACEMENT ([facility]);  Surgeon: Eulogio Bear, MD;  Location: [facility] ORS;  Service: Ophthalmology;  Laterality: Right;  casette lot # [id number] [place]  00:59.5 AP%   16.5 CDE   9.79    COLONOSCOPY  06/2012   normal (in [place])   EYE SURGERY     TONSILLECTOMY     TUBAL LIGATION     Family History  Problem Relation Age of Onset   Alzheimer's disease Mother    Arthritis Mother    CAD Father    Kidney disease Father    Stroke Father    Breast cancer Maternal Grandmother        great gm   Obesity Maternal Grandmother    Diabetes Maternal Aunt    Kidney disease Maternal Aunt    Social History   Socioeconomic History   Marital status: Widowed    Spouse name: Not on file   Number of children: 1   Years of education: Not on file   Highest education level: 12th grade  Occupational History   Occupation: Retired  Tobacco Use   Smoking status: Never   Smokeless tobacco:  Never   Tobacco comments:    smoking cessation materials not required  Vaping Use   Vaping Use: Never used  Substance and Sexual Activity   Alcohol use: Not Currently    Alcohol/week: 1.0 standard drink    Types: 1 Glasses of wine per week    Comment: once monthly maybe   Drug use: No   Sexual activity: Not Currently  Other Topics Concern   Not on file  Social History Narrative   Pt lives alone   Social Determinants of Health   Financial Resource Strain: Low Risk    Difficulty of Paying Living Expenses: Not hard at all  Food Insecurity: No Food Insecurity   Worried About Charity fundraiser in the Last Year: Never true   [place] in the Last Year: Never true  Transportation Needs: No Transportation Needs   Lack of Transportation (Medical): No   Lack of Transportation (Non-Medical): No  Physical Activity: Inactive   Days of Exercise per Week: 0 days   Minutes of Exercise per Session: 0 min  Stress: No Stress Concern Present   Feeling of Stress : Not at all  Social Connections: Socially Isolated   Frequency of Communication with Friends and Family: More than three times a week   Frequency of Social Gatherings with Friends and Family: Twice a week   Attends Religious Services: Never   Marine scientist or Organizations: No   Attends Archivist Meetings: Never   Marital Status: Widowed    Tobacco Counseling Counseling given: No Tobacco comments: smoking cessation materials not required   Clinical Intake:  Pre-visit preparation completed: Yes  Pain : No/denies pain     Nutritional Risks: None Diabetes: Yes CBG done?: No Did pt. bring in CBG monitor from home?: No  How often do you need to have someone help you when you read instructions, pamphlets, or other written materials from your doctor or pharmacy?: 1 - Never  Nutrition Risk Assessment:  Has the patient had any N/V/D within the last 2 months?  No  Does the patient have any  non-healing wounds?  No  Has the patient had any unintentional weight loss or weight gain?  No   Diabetes:  Is the patient diabetic?  Yes  If diabetic, was a CBG obtained today?  No  Did the patient bring in their glucometer from home?  No  How often do you monitor your CBG's? Occasionally as needed per patient.   Financial Strains and Diabetes Management:  Are you having any financial strains with the device, your supplies or your medication? No .  Does the patient want to be seen by Chronic Care Management for management of their diabetes?  No  Would the patient like to be referred to a Nutritionist or for Diabetic Management?  No   Diabetic Exams:  Diabetic Eye Exam: Completed 10/07/19 negative retinopathy.   Diabetic Foot Exam: Completed 03/09/20.   Interpreter Needed?: No  Information entered by :: Clemetine Marker LPN   Activities of Daily Living In your present state of health, do you have any difficulty performing the following activities: 07/07/2020 04/27/2020  Hearing? N N  Comment declines hearing aids -  Vision? N N  Difficulty concentrating or making decisions? N N  Walking or climbing stairs? Y N  Dressing or bathing? N N  Doing errands, shopping? N N  Preparing Food and eating ? N -  Using the Toilet? N -  In the past six months, have you accidently leaked urine? Y -  Comment wears pads for protection -  Do you have problems with loss of bowel control? N -  Managing your Medications? N -  Managing your Finances? N -  Housekeeping or managing your Housekeeping? N -  Some recent data might be hidden    Patient Care Team: Glean Hess, MD as PCP - General (Internal Medicine) Eulogio Bear, MD as Consulting Physician (Ophthalmology) Jannet Mantis, MD as Consulting Physician (Dermatology)  Indicate any recent Medical Services you may have received from other than [facility] providers in the past year (date may be approximate).     Assessment:   This  is a routine wellness examination for [place].  Hearing/Vision screen Hearing Screening - Comments:: Pt denies hearing difficulty but c/o tinnitus  Vision Screening - Comments:: Annual vision screenings done at [facility] Dr. Edison Pace  Dietary issues and exercise activities discussed: Current  Exercise Habits: The patient does not participate in regular exercise at present, Exercise limited by: orthopedic condition(s)   Goals Addressed             This Visit's Progress    Increase physical activity       Recommend increasing physical activity to at least 3 days per week         Depression Screen PHQ 2/9 Scores 07/07/2020 04/27/2020 03/09/2020 11/06/2019 07/15/2019 07/07/2019 03/12/2019  PHQ - 2 Score 0 0 0 0 0 0 0  PHQ- 9 Score - 0 0 0 1 - 0    Fall Risk Fall Risk  07/07/2020 04/27/2020 03/09/2020 11/06/2019 07/15/2019  Falls in the past year? 0 1 1 1  0  Number falls in past yr: 0 0 1 0 -  Injury with Fall? 0 0 0 0 -  Risk for fall due to : No Fall Risks History of fall(s) Impaired balance/gait;History of fall(s) - -  Risk for fall due to: Comment - - - - -  Follow up Falls prevention discussed Falls evaluation completed Falls evaluation completed Falls evaluation completed Falls evaluation completed    Central:  Any stairs in or around the home? Yes  If so, are there any without handrails? No  Home free of loose throw rugs in walkways, pet beds, electrical cords, etc? Yes  Adequate lighting in your home to reduce risk of falls? Yes   ASSISTIVE DEVICES UTILIZED TO PREVENT FALLS:  Life alert? No  Use of a cane, walker or w/c? yes  Grab bars in the bathroom? Yes  Shower chair or bench in shower? Yes  Elevated toilet seat or a handicapped toilet? Yes   TIMED UP AND GO:  Was the test performed? No .  Telephonic visit.    Cognitive Function: Normal cognitive status assessed by direct observation by this Nurse Health Advisor. No  abnormalities found.       6CIT Screen 07/07/2019 07/03/2018 07/02/2017 06/26/2016  What Year? 0 points 0 points 0 points 0 points  What month? 0 points 0 points 0 points 0 points  What time? 0 points 0 points 0 points 0 points  Count back from 20 0 points 0 points 0 points 0 points  Months in reverse 0 points 0 points 0 points 0 points  Repeat phrase 0 points 0 points 0 points 0 points  Total Score 0 0 0 0    Immunizations Immunization History  Administered Date(s) Administered   Hepatitis B 07/04/2011   Hepatitis B, adult 07/04/2011   Influenza, High Dose Seasonal PF 10/12/2016, 10/10/2017   Influenza,inj,Quad PF,6+ Mos 09/29/2013, 09/29/2014, 10/11/2015   Influenza-Unspecified 10/10/2018, 10/13/2019   PFIZER(Purple Top)SARS-COV-2 Vaccination 03/10/2019, 03/30/2019, 01/05/2020   Pneumococcal Conjugate-13 02/11/2015   Pneumococcal Polysaccharide-23 01/10/2005, 06/26/2016   Tdap 03/09/2012   Zoster Recombinat (Shingrix) 07/09/2017, 09/25/2017    TDAP status: Up to date  Flu Vaccine status: Up to date  Pneumococcal vaccine status: Up to date  Covid-19 vaccine status: Completed vaccines  Qualifies for Shingles Vaccine? Yes   Zostavax completed No   Shingrix Completed?: Yes  Screening Tests Health Maintenance  Topic Date Due   COVID-19 Vaccine (4 - Booster for Pfizer series) 04/04/2020   MAMMOGRAM  07/21/2020   INFLUENZA VACCINE  08/09/2020   HEMOGLOBIN A1C  09/09/2020   OPHTHALMOLOGY EXAM  10/06/2020   FOOT EXAM  03/09/2021   TETANUS/TDAP  03/10/2022   COLONOSCOPY (Pts 45-45yrs Insurance coverage will need  to be confirmed)  06/24/2022   DEXA SCAN  Completed   Hepatitis C Screening  Completed   PNA vac Low Risk Adult  Completed   Zoster Vaccines- Shingrix  Completed   HPV VACCINES  Aged Out    Health Maintenance  Health Maintenance Due  Topic Date Due   COVID-19 Vaccine (4 - Booster for Pfizer series) 04/04/2020    Colorectal cancer screening: Type of  screening: Colonoscopy. Completed 06/23/12. Repeat every 10 years  Mammogram status: Completed 07/22/19. Repeat every year. Ordered today.   Bone Density status: Completed 08/11/19. Results reflect: Bone density results: OSTEOPENIA. Repeat every 2 years.  Lung Cancer Screening: (Low Dose CT Chest recommended if Age 71-80 years, 30 pack-year currently smoking OR have quit w/in 15years.) does not qualify.   Additional Screening:  Hepatitis C Screening: does qualify; Completed 02/11/15  Vision Screening: Recommended annual ophthalmology exams for early detection of glaucoma and other disorders of the eye. Is the patient up to date with their annual eye exam?  Yes  Who is the provider or what is the name of the office in which the patient attends annual eye exams? Dr. Edison Pace.   Dental Screening: Recommended annual dental exams for proper oral hygiene  Community Resource Referral / Chronic Care Management: CRR required this visit?  No   CCM required this visit?  No      Plan:     I have personally reviewed and noted the following in the patient's chart:   Medical and social history Use of alcohol, tobacco or illicit drugs  Current medications and supplements including opioid prescriptions.  Functional ability and status Nutritional status Physical activity Advanced directives List of other physicians Hospitalizations, surgeries, and ER visits in previous 12 months Vitals Screenings to include cognitive, depression, and falls Referrals and appointments  In addition, I have reviewed and discussed with patient certain preventive protocols, quality metrics, and best practice recommendations. A written personalized care plan for preventive services as well as general preventive health recommendations were provided to patient.     Clemetine Marker, LPN   7/78/2423   Nurse Notes: pt states recent dermatology exam found new basal cell carcinoma on the back of her neck; pt to follow up with Dr.  Aubery Lapping

## 2020-07-16 ENCOUNTER — Ambulatory Visit (INDEPENDENT_AMBULATORY_CARE_PROVIDER_SITE_OTHER): Payer: Medicare Other | Admitting: Internal Medicine

## 2020-07-16 ENCOUNTER — Other Ambulatory Visit: Payer: Self-pay

## 2020-07-16 ENCOUNTER — Encounter: Payer: Self-pay | Admitting: Internal Medicine

## 2020-07-16 VITALS — BP 124/78 | HR 90 | Temp 98.1°F | Ht 63.0 in | Wt 223.0 lb

## 2020-07-16 DIAGNOSIS — M170 Bilateral primary osteoarthritis of knee: Secondary | ICD-10-CM | POA: Diagnosis not present

## 2020-07-16 DIAGNOSIS — Z6841 Body Mass Index (BMI) 40.0 and over, adult: Secondary | ICD-10-CM

## 2020-07-16 DIAGNOSIS — I10 Essential (primary) hypertension: Secondary | ICD-10-CM

## 2020-07-16 DIAGNOSIS — E785 Hyperlipidemia, unspecified: Secondary | ICD-10-CM

## 2020-07-16 DIAGNOSIS — Z1231 Encounter for screening mammogram for malignant neoplasm of breast: Secondary | ICD-10-CM | POA: Diagnosis not present

## 2020-07-16 DIAGNOSIS — C44310 Basal cell carcinoma of skin of unspecified parts of face: Secondary | ICD-10-CM

## 2020-07-16 DIAGNOSIS — E1169 Type 2 diabetes mellitus with other specified complication: Secondary | ICD-10-CM

## 2020-07-16 DIAGNOSIS — E118 Type 2 diabetes mellitus with unspecified complications: Secondary | ICD-10-CM | POA: Diagnosis not present

## 2020-07-16 DIAGNOSIS — Z0000 Encounter for general adult medical examination without abnormal findings: Secondary | ICD-10-CM

## 2020-07-16 LAB — POCT URINALYSIS DIPSTICK
Bilirubin, UA: NEGATIVE
Blood, UA: NEGATIVE
Glucose, UA: NEGATIVE
Ketones, UA: NEGATIVE
Leukocytes, UA: NEGATIVE
Nitrite, UA: NEGATIVE
Protein, UA: NEGATIVE
Spec Grav, UA: 1.025 (ref 1.010–1.025)
Urobilinogen, UA: 0.2 E.U./dL
pH, UA: 5 (ref 5.0–8.0)

## 2020-07-16 NOTE — Progress Notes (Signed)
Date:  07/16/2020   Name:  Nancy Chen   DOB:  1945/12/12   MRN:  [id number]   Chief Complaint: Annual Exam Nancy Chen is a 75 y.o. female who presents today for her Complete Annual Exam. She feels well. She reports exercising none. She reports she is sleeping well. Breast complaints none. She has chronic knee pain and uses rolator for ambulation.  Mammogram: 07/2019 ordered but not scheduled DEXA: 08/2019 Normal Colonoscopy: 06/2012 repeat 2024  Immunization History  Administered Date(s) Administered   Hepatitis B 07/04/2011   Hepatitis B, adult 07/04/2011   Influenza, High Dose Seasonal PF 10/12/2016, 10/10/2017   Influenza,inj,Quad PF,6+ Mos 09/29/2013, 09/29/2014, 10/11/2015   Influenza-Unspecified 10/10/2018, 10/13/2019   PFIZER(Purple Top)SARS-COV-2 Vaccination 03/10/2019, 03/30/2019, 01/05/2020   Pneumococcal Conjugate-13 02/11/2015   Pneumococcal Polysaccharide-23 01/10/2005, 06/26/2016   Tdap 03/09/2012   Zoster Recombinat (Shingrix) 07/09/2017, 09/25/2017    Hypertension This is a chronic problem. The problem is controlled. Pertinent negatives include no chest pain, headaches, palpitations or shortness of breath. Past treatments include ACE inhibitors and diuretics. The current treatment provides significant improvement. There is no history of CVA or retinopathy.  Diabetes She presents for her follow-up diabetic visit. She has type 2 diabetes mellitus. Her disease course has been stable. There are no hypoglycemic associated symptoms. Pertinent negatives for hypoglycemia include no dizziness, headaches, nervousness/anxiousness or tremors. Associated symptoms include polyuria. Pertinent negatives for diabetes include no chest pain, no fatigue and no polydipsia. Symptoms are stable. Pertinent negatives for diabetic complications include no CVA, heart disease, nephropathy or retinopathy. Current diabetic treatment includes oral agent (monotherapy) (trulicity). She is  following a generally healthy diet. An ACE inhibitor/angiotensin II receptor blocker is being taken. Eye exam is current.  Hyperlipidemia This is a chronic problem. The problem is controlled. Pertinent negatives include no chest pain or shortness of breath. Current antihyperlipidemic treatment includes statins.  Knee Pain  There was no injury mechanism. The pain is present in the left knee. The quality of the pain is described as aching. The pain is moderate. The pain has been Constant since onset. Associated symptoms include an inability to bear weight. The symptoms are aggravated by weight bearing. She has tried NSAIDs for the symptoms. The treatment provided mild relief.   Lab Results  Component Value Date   CREATININE 0.75 11/06/2019   BUN 20 11/06/2019   NA 140 11/06/2019   K 4.7 11/06/2019   CL 99 11/06/2019   CO2 24 11/06/2019   Lab Results  Component Value Date   CHOL 164 07/15/2019   HDL 44 07/15/2019   LDLCALC 73 07/15/2019   TRIG 293 (H) 07/15/2019   CHOLHDL 3.7 07/15/2019   Lab Results  Component Value Date   TSH 1.030 07/15/2019   Lab Results  Component Value Date   HGBA1C 7.8 (A) 03/09/2020   Lab Results  Component Value Date   WBC 6.0 07/15/2019   HGB 13.7 07/15/2019   HCT 41.7 07/15/2019   MCV 91 07/15/2019   PLT 240 07/15/2019   Lab Results  Component Value Date   ALT 32 07/15/2019   AST 33 07/15/2019   ALKPHOS 50 07/15/2019   BILITOT 0.4 07/15/2019     Review of Systems  Constitutional:  Negative for chills, fatigue and fever.  HENT:  Positive for tinnitus (slowly improving). Negative for congestion, hearing loss, trouble swallowing and voice change.   Eyes:  Negative for visual disturbance.  Respiratory:  Negative for cough, chest tightness,  shortness of breath and wheezing.   Cardiovascular:  Negative for chest pain, palpitations and leg swelling.  Gastrointestinal:  Negative for abdominal pain, constipation, diarrhea and vomiting.   Endocrine: Positive for polyuria. Negative for polydipsia.  Genitourinary:  Positive for frequency and urgency. Negative for dysuria, genital sores, vaginal bleeding and vaginal discharge.  Musculoskeletal:  Positive for arthralgias and gait problem (uses rolator). Negative for joint swelling.  Skin:  Positive for wound (basal cell on right side of neck). Negative for color change and rash.  Neurological:  Negative for dizziness, tremors, light-headedness and headaches.  Hematological:  Negative for adenopathy. Does not bruise/bleed easily.  Psychiatric/Behavioral:  Negative for dysphoric mood and sleep disturbance. The patient is not nervous/anxious.    Patient Active Problem List   Diagnosis Date Noted   Aortic atherosclerosis ([place]) 07/15/2019   BMI 40.0-44.9, adult ([place]) 03/12/2019   Facial basal cell cancer 10/31/2016   Migraine 10/31/2016   Lipoma of left upper extremity 10/11/2015   Type II diabetes mellitus with complication ([place]) 85/46/2703   Ovarian failure 11/10/2014   Essential (primary) hypertension 07/15/2014   Hyperlipidemia associated with type 2 diabetes mellitus ([place]) 07/15/2014   Tricompartment osteoarthritis of knee 07/15/2014    Allergies  Allergen Reactions   Glipizide Swelling    hives   Codeine Hives    Past Surgical History:  Procedure Laterality Date   ABDOMINAL HYSTERECTOMY     BASAL CELL CARCINOMA EXCISION     CARPAL TUNNEL RELEASE Bilateral    CARPAL TUNNEL RELEASE     CATARACT EXTRACTION W/PHACO Left 09/07/2016   Procedure: CATARACT EXTRACTION PHACO AND INTRAOCULAR LENS PLACEMENT ([place]);  Surgeon: Eulogio Bear, MD;  Location: [facility] ORS;  Service: Ophthalmology;  Laterality: Left;  [place] 00:41.9 AP% 14.9 CDE 6.23 Fluid pack lot # [id number]   CATARACT EXTRACTION W/PHACO Right 10/19/2016   Procedure: CATARACT EXTRACTION PHACO AND INTRAOCULAR LENS PLACEMENT (IOC);  Surgeon: Eulogio Bear, MD;  Location: [facility] ORS;  Service: Ophthalmology;   Laterality: Right;  casette lot # [id number] H [place]  00:59.5 AP%   16.5 CDE   9.79    COLONOSCOPY  06/2012   normal (in [place])   EYE SURGERY     TONSILLECTOMY     TUBAL LIGATION      Social History   Tobacco Use   Smoking status: Never   Smokeless tobacco: Never   Tobacco comments:    smoking cessation materials not required  Vaping Use   Vaping Use: Never used  Substance Use Topics   Alcohol use: Not Currently    Alcohol/week: 1.0 standard drink    Types: 1 Glasses of wine per week    Comment: once monthly maybe   Drug use: No     Medication list has been reviewed and updated.  Current Meds  Medication Sig   Acetaminophen (TYLENOL ARTHRITIS PAIN PO) Take 1 capsule by mouth 2 (two) times daily.   albuterol (VENTOLIN HFA) 108 (90 Base) MCG/ACT inhaler Inhale 2 puffs into the lungs every 6 (six) hours as needed for wheezing or shortness of breath.   aspirin 81 MG chewable tablet Chew by mouth daily.   Bioflavonoids (BIOFLAVONOID-1000) 1000 MG TABS Take by mouth.   cetirizine (ZYRTEC) 10 MG tablet Take 10 mg by mouth as needed.    glucose blood test strip    lisinopril-hydrochlorothiazide (ZESTORETIC) 20-12.5 MG tablet TAKE 1 TABLET DAILY   metFORMIN (GLUCOPHAGE) 1000 MG tablet TAKE 1 TABLET TWICE A DAY   Multiple Vitamins-Minerals (  PRESERVISION AREDS PO) Take by mouth.   Pyridoxine HCl (VITAMIN B6) 200 MG TABS Take 100 mg by mouth 2 (two) times daily.   simvastatin (ZOCOR) 20 MG tablet TAKE 1 TABLET DAILY   TRULICITY 1.5 UM/3.5TI SOPN INJECT 0.5ML (=1.5 MG)     SUBCUTANEOUSLY ONCE WEEKLY    PHQ 2/9 Scores 07/16/2020 07/07/2020 04/27/2020 03/09/2020  PHQ - 2 Score 0 0 0 0  PHQ- 9 Score 6 - 0 0    GAD 7 : Generalized Anxiety Score 07/16/2020 04/27/2020 03/09/2020 11/06/2019  Nervous, Anxious, on Edge 0 0 0 0  Control/stop worrying 0 0 0 0  Worry too much - different things 0 0 0 0  Trouble relaxing 0 0 0 0  Restless 0 0 0 0  Easily annoyed or irritable 0 0 0 0  Afraid - awful  might happen 0 0 0 0  Total GAD 7 Score 0 0 0 0  Anxiety Difficulty - Not difficult at all Not difficult at all Not difficult at all    BP Readings from Last 3 Encounters:  07/16/20 124/78  04/27/20 114/72  03/09/20 132/88    Physical Exam Vitals and nursing note reviewed.  Constitutional:      General: She is not in acute distress.    Appearance: She is well-developed. She is obese.  HENT:     Head: Normocephalic and atraumatic.     Right Ear: Tympanic membrane and ear canal normal.     Left Ear: Tympanic membrane and ear canal normal.     Nose:     Right Sinus: No maxillary sinus tenderness.     Left Sinus: No maxillary sinus tenderness.  Eyes:     General: No scleral icterus.       Right eye: No discharge.        Left eye: No discharge.     Conjunctiva/sclera: Conjunctivae normal.  Neck:     Thyroid: No thyromegaly.     Vascular: No carotid bruit.  Cardiovascular:     Rate and Rhythm: Normal rate and regular rhythm.     Pulses: Normal pulses.     Heart sounds: Normal heart sounds.  Pulmonary:     Effort: Pulmonary effort is normal. No respiratory distress.     Breath sounds: No wheezing.  Chest:  Breasts:    Right: No mass, nipple discharge, skin change or tenderness.     Left: No mass, nipple discharge, skin change or tenderness.  Abdominal:     General: Bowel sounds are normal.     Palpations: Abdomen is soft. There is no mass.     Tenderness: There is no abdominal tenderness. There is no guarding.  Musculoskeletal:     Cervical back: Normal range of motion. No erythema.     Right lower leg: No edema.     Left lower leg: No edema.  Lymphadenopathy:     Cervical: No cervical adenopathy.  Skin:    General: Skin is warm and dry.     Capillary Refill: Capillary refill takes less than 2 seconds.     Findings: Wound (superficial lesion on right side of neck) present. No rash.  Neurological:     General: No focal deficit present.     Mental Status: She is alert  and oriented to person, place, and time.     Cranial Nerves: No cranial nerve deficit.     Sensory: No sensory deficit.     Gait: Gait abnormal.  Deep Tendon Reflexes: Reflexes are normal and symmetric.  Psychiatric:        Attention and Perception: Attention normal.        Mood and Affect: Mood normal.    Wt Readings from Last 3 Encounters:  07/16/20 223 lb (101.2 kg)  04/27/20 226 lb (102.5 kg)  03/09/20 226 lb (102.5 kg)    BP 124/78   Pulse 90   Temp 98.1 F (36.7 C) (Oral)   Ht 5\' 3"  (1.6 m)   Wt 223 lb (101.2 kg)   SpO2 95%   BMI 39.50 kg/m   Assessment and Plan: 1. Essential (primary) hypertension Clinically stable exam with well controlled BP. Tolerating medications without side effects at this time. Pt to continue current regimen and low sodium diet;  - CBC with Differential/Platelet - POCT urinalysis dipstick  2. Encounter for screening mammogram for malignant neoplasm of breast She will schedule mammogram at [facility] this month.  3. Hyperlipidemia associated with type 2 diabetes mellitus ([place]) Tolerating statin medication without side effects at this time LDL is essentially at goal of < 70 on current dose of moderate intensity statin. Continue same therapy without change at this time. - Lipid panel  4. Type II diabetes mellitus with complication (HCC) Clinically stable by exam and report without s/s of hypoglycemia. DM complicated by hypertension and dyslipidemia. Tolerating medications well without side effects or other concerns. Trulicity is very expensive - she may want to go back on Januvia for cost savings. Can also consider Rybelsus - Comprehensive metabolic panel - Hemoglobin A1c - TSH  5. BMI 40.0-44.9, adult (HCC) Continue diet efforts  6. Healthcare maintenance Immunizations and other screenings are up to date  62. Tricompartment osteoarthritis of both knees Continue nsaids as needed Rolator for ambulation  8. Facial basal cell  cancer Followed by Dr. Phillip Heal - planning wider excision.   Partially dictated using Editor, commissioning. Any errors are unintentional.  Halina Maidens, MD [facility]  07/16/2020

## 2020-07-16 NOTE — Patient Instructions (Signed)
Rybelsus - oral version of trulicity    Check on the cost with insurance

## 2020-07-17 LAB — COMPREHENSIVE METABOLIC PANEL
ALT: 37 IU/L — ABNORMAL HIGH (ref 0–32)
AST: 33 IU/L (ref 0–40)
Albumin/Globulin Ratio: 2.3 — ABNORMAL HIGH (ref 1.2–2.2)
Albumin: 4.9 g/dL — ABNORMAL HIGH (ref 3.7–4.7)
Alkaline Phosphatase: 51 IU/L (ref 44–121)
BUN/Creatinine Ratio: 25 (ref 12–28)
BUN: 17 mg/dL (ref 8–27)
Bilirubin Total: 0.5 mg/dL (ref 0.0–1.2)
CO2: 24 mmol/L (ref 20–29)
Calcium: 10 mg/dL (ref 8.7–10.3)
Chloride: 98 mmol/L (ref 96–106)
Creatinine, Ser: 0.69 mg/dL (ref 0.57–1.00)
Globulin, Total: 2.1 g/dL (ref 1.5–4.5)
Glucose: 153 mg/dL — ABNORMAL HIGH (ref 65–99)
Potassium: 4.6 mmol/L (ref 3.5–5.2)
Sodium: 140 mmol/L (ref 134–144)
Total Protein: 7 g/dL (ref 6.0–8.5)
eGFR: 92 mL/min/{1.73_m2} (ref >59)

## 2020-07-17 LAB — LIPID PANEL
Chol/HDL Ratio: 3.5 ratio (ref 0.0–4.4)
Cholesterol, Total: 155 mg/dL (ref 100–199)
HDL: 44 mg/dL (ref >39)
LDL Chol Calc (NIH): 66 mg/dL (ref 0–99)
Triglycerides: 285 mg/dL — ABNORMAL HIGH (ref 0–149)
VLDL Cholesterol Cal: 45 mg/dL — ABNORMAL HIGH (ref 5–40)

## 2020-07-17 LAB — CBC WITH DIFFERENTIAL/PLATELET
Basophils Absolute: 0 10*3/uL (ref 0.0–0.2)
Basos: 0 %
EOS (ABSOLUTE): 0.1 10*3/uL (ref 0.0–0.4)
Eos: 2 %
Hematocrit: 43 % (ref 34.0–46.6)
Hemoglobin: 13.9 g/dL (ref 11.1–15.9)
Immature Grans (Abs): 0 10*3/uL (ref 0.0–0.1)
Immature Granulocytes: 0 %
Lymphocytes Absolute: 2.3 10*3/uL (ref 0.7–3.1)
Lymphs: 43 %
MCH: 28.9 pg (ref 26.6–33.0)
MCHC: 32.3 g/dL (ref 31.5–35.7)
MCV: 89 fL (ref 79–97)
Monocytes Absolute: 0.5 10*3/uL (ref 0.1–0.9)
Monocytes: 9 %
Neutrophils Absolute: 2.5 10*3/uL (ref 1.4–7.0)
Neutrophils: 46 %
Platelets: 241 10*3/uL (ref 150–450)
RBC: 4.81 x10E6/uL (ref 3.77–5.28)
RDW: 13 % (ref 11.7–15.4)
WBC: 5.4 10*3/uL (ref 3.4–10.8)

## 2020-07-17 LAB — TSH: TSH: 1.32 u[IU]/mL (ref 0.450–4.500)

## 2020-07-17 LAB — HEMOGLOBIN A1C
Est. average glucose Bld gHb Est-mCnc: 186 mg/dL
Hgb A1c MFr Bld: 8.1 % — ABNORMAL HIGH (ref 4.8–5.6)

## 2020-07-18 ENCOUNTER — Encounter: Payer: Self-pay | Admitting: Internal Medicine

## 2020-07-19 ENCOUNTER — Other Ambulatory Visit: Payer: Self-pay | Admitting: Internal Medicine

## 2020-07-19 DIAGNOSIS — E118 Type 2 diabetes mellitus with unspecified complications: Secondary | ICD-10-CM

## 2020-07-27 ENCOUNTER — Ambulatory Visit
Admission: RE | Admit: 2020-07-27 | Discharge: 2020-07-27 | Disposition: A | Discharge status: Home / Self Care | Payer: Medicare Other | Source: Ambulatory Visit | Attending: Internal Medicine | Admitting: Internal Medicine

## 2020-07-27 ENCOUNTER — Other Ambulatory Visit: Payer: Self-pay

## 2020-07-27 DIAGNOSIS — Z1231 Encounter for screening mammogram for malignant neoplasm of breast: Secondary | ICD-10-CM | POA: Diagnosis not present

## 2020-07-28 DIAGNOSIS — C4441 Basal cell carcinoma of skin of scalp and neck: Secondary | ICD-10-CM | POA: Diagnosis not present

## 2020-09-07 DIAGNOSIS — Z23 Encounter for immunization: Secondary | ICD-10-CM | POA: Diagnosis not present

## 2020-09-22 ENCOUNTER — Encounter: Payer: Self-pay | Admitting: Internal Medicine

## 2020-09-22 ENCOUNTER — Other Ambulatory Visit: Payer: Self-pay | Admitting: Internal Medicine

## 2020-09-22 ENCOUNTER — Telehealth: Payer: Self-pay

## 2020-09-22 DIAGNOSIS — E118 Type 2 diabetes mellitus with unspecified complications: Secondary | ICD-10-CM

## 2020-09-22 NOTE — Telephone Encounter (Signed)
Copied from [place] [id number]. Topic: General - Other >> Sep 22, 2020  4:12 PM Celene Kras wrote: Reason for CRM: Pt calling stating that she is needing to have an endocrinologist referral at [facility] [place]. She states that she would like to have a further study into what's going on with her. Please advise.   Fax# [phone]

## 2020-09-22 NOTE — Telephone Encounter (Signed)
See my chart message for response from Dr Army Melia.

## 2020-10-22 DIAGNOSIS — E119 Type 2 diabetes mellitus without complications: Secondary | ICD-10-CM | POA: Diagnosis not present

## 2020-10-22 LAB — HM DIABETES EYE EXAM

## 2020-10-27 ENCOUNTER — Encounter: Payer: Self-pay | Admitting: Internal Medicine

## 2020-10-28 ENCOUNTER — Other Ambulatory Visit: Payer: Self-pay | Admitting: Internal Medicine

## 2020-11-16 ENCOUNTER — Encounter: Payer: Self-pay | Admitting: Internal Medicine

## 2020-11-16 ENCOUNTER — Other Ambulatory Visit: Payer: Self-pay

## 2020-11-16 ENCOUNTER — Ambulatory Visit (INDEPENDENT_AMBULATORY_CARE_PROVIDER_SITE_OTHER): Payer: Medicare Other | Admitting: Internal Medicine

## 2020-11-16 VITALS — BP 108/74 | HR 90 | Ht 63.0 in | Wt 208.8 lb

## 2020-11-16 DIAGNOSIS — E118 Type 2 diabetes mellitus with unspecified complications: Secondary | ICD-10-CM

## 2020-11-16 DIAGNOSIS — I10 Essential (primary) hypertension: Secondary | ICD-10-CM

## 2020-11-16 LAB — POCT GLYCOSYLATED HEMOGLOBIN (HGB A1C): Hemoglobin A1C: 6.5 % — AB (ref 4.0–5.6)

## 2020-11-16 NOTE — Progress Notes (Signed)
Date:  11/16/2020   Name:  Nancy Chen   DOB:  10/10/1945   MRN:  [id number]   Chief Complaint: Hypertension and Diabetes  Hypertension This is a chronic problem. The problem is controlled. Pertinent negatives include no chest pain, headaches, palpitations or shortness of breath. Past treatments include ACE inhibitors and diuretics. The current treatment provides significant improvement.  Diabetes She presents for her follow-up diabetic visit. She has type 2 diabetes mellitus. Pertinent negatives for hypoglycemia include no dizziness, headaches or nervousness/anxiousness. Pertinent negatives for diabetes include no chest pain, no fatigue and no weakness. Symptoms are stable. Current diabetic treatments: metformin and Trulicity. Her weight is decreasing steadily (she has been working very hard since her last visit and has lost almost 20 lbs.). She is following a generally healthy diet. Her home blood glucose trend is decreasing steadily. An ACE inhibitor/angiotensin II receptor blocker is being taken. Eye exam is current.   Lab Results  Component Value Date   CREATININE 0.69 07/16/2020   BUN 17 07/16/2020   NA 140 07/16/2020   K 4.6 07/16/2020   CL 98 07/16/2020   CO2 24 07/16/2020   Lab Results  Component Value Date   CHOL 155 07/16/2020   HDL 44 07/16/2020   LDLCALC 66 07/16/2020   TRIG 285 (H) 07/16/2020   CHOLHDL 3.5 07/16/2020   Lab Results  Component Value Date   TSH 1.320 07/16/2020   Lab Results  Component Value Date   HGBA1C 8.1 (H) 07/16/2020   Lab Results  Component Value Date   WBC 5.4 07/16/2020   HGB 13.9 07/16/2020   HCT 43.0 07/16/2020   MCV 89 07/16/2020   PLT 241 07/16/2020   Lab Results  Component Value Date   ALT 37 (H) 07/16/2020   AST 33 07/16/2020   ALKPHOS 51 07/16/2020   BILITOT 0.5 07/16/2020     Review of Systems  Constitutional:  Negative for fatigue and unexpected weight change (lost 18 lbs with diet changes).  HENT:  Negative  for nosebleeds.   Eyes:  Negative for visual disturbance.  Respiratory:  Negative for cough, chest tightness, shortness of breath and wheezing.   Cardiovascular:  Negative for chest pain, palpitations and leg swelling.  Gastrointestinal:  Negative for abdominal pain, constipation and diarrhea.  Musculoskeletal:  Positive for arthralgias and gait problem.  Neurological:  Negative for dizziness, weakness, light-headedness and headaches.  Psychiatric/Behavioral:  Positive for sleep disturbance. Negative for dysphoric mood. The patient is not nervous/anxious.    Patient Active Problem List   Diagnosis Date Noted   Aortic atherosclerosis ([place]) 07/15/2019   BMI 40.0-44.9, adult ([place]) 03/12/2019   Facial basal cell cancer 10/31/2016   Migraine 10/31/2016   Lipoma of left upper extremity 10/11/2015   Type II diabetes mellitus with complication ([place]) 60/63/0160   Ovarian failure 11/10/2014   Essential (primary) hypertension 07/15/2014   Hyperlipidemia associated with type 2 diabetes mellitus ([place]) 07/15/2014   Tricompartment osteoarthritis of knee 07/15/2014    Allergies  Allergen Reactions   Glipizide Swelling    hives   Codeine Hives    Past Surgical History:  Procedure Laterality Date   ABDOMINAL HYSTERECTOMY     BASAL CELL CARCINOMA EXCISION     CARPAL TUNNEL RELEASE Bilateral    CARPAL TUNNEL RELEASE     CATARACT EXTRACTION W/PHACO Left 09/07/2016   Procedure: CATARACT EXTRACTION PHACO AND INTRAOCULAR LENS PLACEMENT ([place]);  Surgeon: Eulogio Bear, MD;  Location: [facility] ORS;  Service:  Ophthalmology;  Laterality: Left;  [place] 00:41.9 AP% 14.9 CDE 6.23 Fluid pack lot # [id number]   CATARACT EXTRACTION W/PHACO Right 10/19/2016   Procedure: CATARACT EXTRACTION PHACO AND INTRAOCULAR LENS PLACEMENT (IOC);  Surgeon: Eulogio Bear, MD;  Location: [facility] ORS;  Service: Ophthalmology;  Laterality: Right;  casette lot # [id number] H [place]  00:59.5 AP%   16.5 CDE   9.79    COLONOSCOPY   06/2012   normal (in [place])   EYE SURGERY     TONSILLECTOMY     TUBAL LIGATION      Social History   Tobacco Use   Smoking status: Never   Smokeless tobacco: Never   Tobacco comments:    smoking cessation materials not required  Vaping Use   Vaping Use: Never used  Substance Use Topics   Alcohol use: Not Currently    Alcohol/week: 1.0 standard drink    Types: 1 Glasses of wine per week    Comment: once monthly maybe   Drug use: No     Medication list has been reviewed and updated.  Current Meds  Medication Sig   Acetaminophen (TYLENOL ARTHRITIS PAIN PO) Take 1 capsule by mouth 2 (two) times daily.   albuterol (VENTOLIN HFA) 108 (90 Base) MCG/ACT inhaler Inhale 2 puffs into the lungs every 6 (six) hours as needed for wheezing or shortness of breath.   aspirin 81 MG chewable tablet Chew by mouth daily.   Bioflavonoids (BIOFLAVONOID-1000) 1000 MG TABS Take by mouth.   cetirizine (ZYRTEC) 10 MG tablet Take 10 mg by mouth as needed.    glucose blood test strip    lisinopril-hydrochlorothiazide (ZESTORETIC) 20-12.5 MG tablet TAKE 1 TABLET DAILY   metFORMIN (GLUCOPHAGE) 1000 MG tablet TAKE 1 TABLET TWICE A DAY   Multiple Vitamins-Minerals (PRESERVISION AREDS PO) Take by mouth.   naproxen sodium (ANAPROX) 220 MG tablet Take 220 mg by mouth 2 (two) times daily with a meal. PRN   Pyridoxine HCl (VITAMIN B6) 200 MG TABS Take 100 mg by mouth 2 (two) times daily.   simvastatin (ZOCOR) 20 MG tablet TAKE 1 TABLET DAILY   TRULICITY 1.5 IR/4.8NI SOPN INJECT 0.5ML (=1.5 MG)     SUBCUTANEOUSLY ONCE WEEKLY    PHQ 2/9 Scores 11/16/2020 07/16/2020 07/07/2020 04/27/2020  PHQ - 2 Score 0 0 0 0  PHQ- 9 Score 1 6 - 0    GAD 7 : Generalized Anxiety Score 11/16/2020 07/16/2020 04/27/2020 03/09/2020  Nervous, Anxious, on Edge 0 0 0 0  Control/stop worrying 0 0 0 0  Worry too much - different things 0 0 0 0  Trouble relaxing 0 0 0 0  Restless 0 0 0 0  Easily annoyed or irritable 0 0 0 0  Afraid - awful  might happen 0 0 0 0  Total GAD 7 Score 0 0 0 0  Anxiety Difficulty Not difficult at all - Not difficult at all Not difficult at all    BP Readings from Last 3 Encounters:  11/16/20 108/74  07/16/20 124/78  04/27/20 114/72    Physical Exam Vitals and nursing note reviewed.  Constitutional:      General: She is not in acute distress.    Appearance: Normal appearance. She is well-developed.  HENT:     Head: Normocephalic and atraumatic.  Cardiovascular:     Rate and Rhythm: Normal rate and regular rhythm.     Pulses: Normal pulses.  Pulmonary:     Effort: Pulmonary effort is normal. No respiratory distress.  Breath sounds: No wheezing or rhonchi.  Musculoskeletal:     Cervical back: Normal range of motion.     Right lower leg: No edema.     Left lower leg: No edema.  Lymphadenopathy:     Cervical: No cervical adenopathy.  Skin:    General: Skin is warm and dry.     Capillary Refill: Capillary refill takes less than 2 seconds.     Findings: No rash.  Neurological:     General: No focal deficit present.     Mental Status: She is alert and oriented to person, place, and time.     Gait: Gait abnormal (uses a rollator).  Psychiatric:        Mood and Affect: Mood normal.        Behavior: Behavior normal.    Wt Readings from Last 3 Encounters:  11/16/20 208 lb 12.8 oz (94.7 kg)  07/16/20 223 lb (101.2 kg)  04/27/20 226 lb (102.5 kg)    BP 108/74   Pulse 90   Ht 5\' 3"  (1.6 m)   Wt 208 lb 12.8 oz (94.7 kg)   SpO2 96%   BMI 36.99 kg/m   Assessment and Plan: 1. Type II diabetes mellitus with complication (HCC) BS is much improved with diet changes. Continue current medications for now. - POCT glycosylated hemoglobin (Hb A1C) = 6.5 down from 8.1  2. Essential (primary) hypertension Clinically stable exam with well controlled BP. Tolerating medications without side effects at this time. Pt to continue current regimen and low sodium diet; benefits of regular  exercise as able discussed.  Partially dictated using Editor, commissioning. Any errors are unintentional.  Halina Maidens, MD [facility]  11/16/2020

## 2020-11-17 DIAGNOSIS — Z23 Encounter for immunization: Secondary | ICD-10-CM | POA: Diagnosis not present

## 2020-12-16 DIAGNOSIS — Z20822 Contact with and (suspected) exposure to covid-19: Secondary | ICD-10-CM | POA: Diagnosis not present

## 2021-01-06 ENCOUNTER — Other Ambulatory Visit: Payer: Self-pay | Admitting: Internal Medicine

## 2021-01-06 DIAGNOSIS — Z23 Encounter for immunization: Secondary | ICD-10-CM | POA: Diagnosis not present

## 2021-01-06 DIAGNOSIS — E118 Type 2 diabetes mellitus with unspecified complications: Secondary | ICD-10-CM

## 2021-01-06 NOTE — Telephone Encounter (Signed)
Medication: TRULICITY 1.5 SN/0.1EX SOPN [[id number]] Pt reports that she has 1 pen left  Has the patient contacted their pharmacy? YES - Advised to contact the office (Agent: If no, request that the patient contact the pharmacy for the refill. If patient does not wish to contact the pharmacy document the reason why and proceed with request.) (Agent: If yes, when and what did the pharmacy advise?)  Preferred Pharmacy (with phone number or street name): [id number] [place], [address] [place] [address] Phone: [phone] Fax: [phone] Hours: Not open 24 hours   Has the patient been seen for an appointment in the last year OR does the patient have an upcoming appointment? YES 11/16/20  Agent: Please be advised that RX refills may take up to 3 business days. We ask that you follow-up with your pharmacy.

## 2021-01-07 NOTE — Telephone Encounter (Signed)
Requested medication (s) are on the active medication list: yes  Future visit scheduled: 03/16/21  Notes to clinic:  there is no signature/instructions on this rx, please assess.   Requested Prescriptions  Pending Prescriptions Disp Refills   Dulaglutide (TRULICITY) 1.5 DX/4.1OI SOPN 6 mL 1     Endocrinology:  Diabetes - GLP-1 Receptor Agonists Passed - 01/06/2021  5:42 PM      Passed - HBA1C is between 0 and 7.9 and within 180 days    Hemoglobin A1C  Date Value Ref Range Status  11/16/2020 6.5 (A) 4.0 - 5.6 % Final   Hgb A1c MFr Bld  Date Value Ref Range Status  07/16/2020 8.1 (H) 4.8 - 5.6 % Final    Comment:             Prediabetes: 5.7 - 6.4          Diabetes: >6.4          Glycemic control for adults with diabetes: <7.0           Passed - Valid encounter within last 6 months    Recent Outpatient Visits           1 month ago Type II diabetes mellitus with complication [facility])   [facility] Glean Hess, MD   5 months ago Essential (primary) hypertension   [facility] Glean Hess, MD   8 months ago [facility] Glean Hess, MD   10 months ago Essential (primary) hypertension   [facility] Glean Hess, MD   1 year ago Essential (primary) hypertension   [facility] Glean Hess, MD       Future Appointments             In 2 months Army Melia Jesse Sans, MD [facility], [place]   In 6 months Army Melia, Jesse Sans, MD [facility], [facility]

## 2021-01-07 NOTE — Telephone Encounter (Signed)
Requested Prescriptions  Pending Prescriptions Disp Refills   TRULICITY 1.5 NO/0.3BC SOPN [Pharmacy Med Name: TRULICITY(4) PEN 4.8/8.8] 6 mL 1    Sig: INJECT 0.5ML (=1.5 MG)     SUBCUTANEOUSLY ONCE WEEKLY     Endocrinology:  Diabetes - GLP-1 Receptor Agonists Passed - 01/06/2021  7:32 PM      Passed - HBA1C is between 0 and 7.9 and within 180 days    Hemoglobin A1C  Date Value Ref Range Status  11/16/2020 6.5 (A) 4.0 - 5.6 % Final   Hgb A1c MFr Bld  Date Value Ref Range Status  07/16/2020 8.1 (H) 4.8 - 5.6 % Final    Comment:             Prediabetes: 5.7 - 6.4          Diabetes: >6.4          Glycemic control for adults with diabetes: <7.0          Passed - Valid encounter within last 6 months    Recent Outpatient Visits          1 month ago Type II diabetes mellitus with complication [facility])   [facility] Glean Hess, MD   5 months ago Essential (primary) hypertension   [facility] Glean Hess, MD   8 months ago [facility] Glean Hess, MD   10 months ago Essential (primary) hypertension   [facility] Glean Hess, MD   1 year ago Essential (primary) hypertension   [facility] Glean Hess, MD      Future Appointments            In 2 months Army Melia Jesse Sans, MD [facility], [place]   In 6 months Army Melia, Jesse Sans, MD [facility], [facility]

## 2021-01-19 ENCOUNTER — Other Ambulatory Visit: Payer: Self-pay | Admitting: Internal Medicine

## 2021-01-19 NOTE — Telephone Encounter (Signed)
Requested Prescriptions  Pending Prescriptions Disp Refills   simvastatin (ZOCOR) 20 MG tablet [Pharmacy Med Name: SIMVASTATIN  TAB 20MG] 90 tablet 1    Sig: TAKE 1 TABLET DAILY     Cardiovascular:  Antilipid - Statins Failed - 01/19/2021  2:09 AM      Failed - Triglycerides in normal range and within 360 days    Triglycerides  Date Value Ref Range Status  07/16/2020 285 (H) 0 - 149 mg/dL Final         Passed - Total Cholesterol in normal range and within 360 days    Cholesterol, Total  Date Value Ref Range Status  07/16/2020 155 100 - 199 mg/dL Final         Passed - LDL in normal range and within 360 days    LDL Chol Calc (NIH)  Date Value Ref Range Status  07/16/2020 66 0 - 99 mg/dL Final         Passed - HDL in normal range and within 360 days    HDL  Date Value Ref Range Status  07/16/2020 44 >39 mg/dL Final         Passed - Patient is not pregnant      Passed - Valid encounter within last 12 months    Recent Outpatient Visits          2 months ago Type II diabetes mellitus with complication [facility])   [facility] Glean Hess, MD   6 months ago Essential (primary) hypertension   [facility] Glean [facility], MD   8 months ago [facility] Glean [facility], MD   10 months ago Essential (primary) hypertension   [facility] Glean Hess, MD   1 year ago Essential (primary) hypertension   [place], Laura H, MD      Future Appointments            In 1 month Army Melia Jesse Sans, MD [facility], [place]   In 6 months Glean Hess, MD [facility]            lisinopril-hydrochlorothiazide (ZESTORETIC) 20-12.5 MG tablet [Pharmacy Med Name: LISINOP/HCTZ TAB 20-12.5] 90 tablet 1    Sig: TAKE 1 TABLET DAILY     Cardiovascular:  ACEI + Diuretic Combos Failed - 01/19/2021  2:09 AM      Failed - Na in normal range and within 180 days    Sodium  Date Value  Ref Range Status  07/16/2020 140 134 - 144 mmol/L Final         Failed - K in normal range and within 180 days    Potassium  Date Value Ref Range Status  07/16/2020 4.6 3.5 - 5.2 mmol/L Final         Failed - Cr in normal range and within 180 days    Creatinine, Ser  Date Value Ref Range Status  07/16/2020 0.69 0.57 - 1.00 mg/dL Final         Failed - Ca in normal range and within 180 days    Calcium  Date Value Ref Range Status  07/16/2020 10.0 8.7 - 10.3 mg/dL Final         Passed - Patient is not pregnant      Passed - Last BP in normal range    BP Readings from Last 1 Encounters:  11/16/20 108/74         Passed -  Valid encounter within last 6 months    Recent Outpatient Visits          2 months ago Type II diabetes mellitus with complication [facility])   [facility] Glean [facility], MD   6 months ago Essential (primary) hypertension   [facility] Glean Hess, MD   8 months ago [facility] Glean Hess, MD   10 months ago Essential (primary) hypertension   [facility] Glean Hess, MD   1 year ago Essential (primary) hypertension   [facility] Glean [facility], MD      Future Appointments            In 1 month Army Melia Jesse Sans, MD [facility], [place]   In 6 months Army Melia Jesse Sans, MD [facility]            metFORMIN (GLUCOPHAGE) 1000 MG tablet [Pharmacy Med Name: METFORMIN TAB 1000MG] 180 tablet 1    Sig: TAKE 1 TABLET TWICE A DAY     Endocrinology:  Diabetes - Biguanides Passed - 01/19/2021  2:09 AM      Passed - Cr in normal range and within 360 days    Creatinine, Ser  Date Value Ref Range Status  07/16/2020 0.69 0.57 - 1.00 mg/dL Final         Passed - HBA1C is between 0 and 7.9 and within 180 days    Hemoglobin A1C  Date Value Ref Range Status  11/16/2020 6.5 (A) 4.0 - 5.6 % Final   Hgb A1c MFr Bld  Date Value Ref Range Status  07/16/2020  8.1 (H) 4.8 - 5.6 % Final    Comment:             Prediabetes: 5.7 - 6.4          Diabetes: >6.4          Glycemic control for adults with diabetes: <7.0          Passed - eGFR in normal range and within 360 days    GFR calc Af Amer  Date Value Ref Range Status  11/06/2019 92 >59 mL/min/1.73 Final    Comment:    **In accordance with recommendations from the NKF-ASN Task force,**   [facility] is in the process of updating its eGFR calculation to the   2021 CKD-EPI creatinine equation that estimates kidney function   without a race variable.    GFR calc non Af Amer  Date Value Ref Range Status  11/06/2019 80 >59 mL/min/1.73 Final   eGFR  Date Value Ref Range Status  07/16/2020 92 >59 mL/min/1.73 Final         Passed - Valid encounter within last 6 months    Recent Outpatient Visits          2 months ago Type II diabetes mellitus with complication [facility])   [facility] Glean [facility], MD   6 months ago Essential (primary) hypertension   [facility] Glean Hess, MD   8 months ago [facility] Glean Hess, MD   10 months ago Essential (primary) hypertension   [facility] Glean Hess, MD   1 year ago Essential (primary) hypertension   [facility] Glean Hess, MD      Future Appointments            In 1 month Glean Hess,  MD [facility], [place]   In 6 months Army Melia, Jesse Sans, MD [place]

## 2021-01-31 DIAGNOSIS — I10 Essential (primary) hypertension: Secondary | ICD-10-CM | POA: Diagnosis not present

## 2021-01-31 DIAGNOSIS — E669 Obesity, unspecified: Secondary | ICD-10-CM | POA: Diagnosis not present

## 2021-01-31 DIAGNOSIS — E1169 Type 2 diabetes mellitus with other specified complication: Secondary | ICD-10-CM | POA: Diagnosis not present

## 2021-02-02 DIAGNOSIS — Z86018 Personal history of other benign neoplasm: Secondary | ICD-10-CM | POA: Diagnosis not present

## 2021-02-02 DIAGNOSIS — Z85828 Personal history of other malignant neoplasm of skin: Secondary | ICD-10-CM | POA: Diagnosis not present

## 2021-02-02 DIAGNOSIS — Z872 Personal history of diseases of the skin and subcutaneous tissue: Secondary | ICD-10-CM | POA: Diagnosis not present

## 2021-02-02 DIAGNOSIS — L821 Other seborrheic keratosis: Secondary | ICD-10-CM | POA: Diagnosis not present

## 2021-02-02 DIAGNOSIS — L578 Other skin changes due to chronic exposure to nonionizing radiation: Secondary | ICD-10-CM | POA: Diagnosis not present

## 2021-02-02 DIAGNOSIS — D234 Other benign neoplasm of skin of scalp and neck: Secondary | ICD-10-CM | POA: Diagnosis not present

## 2021-02-02 DIAGNOSIS — Z859 Personal history of malignant neoplasm, unspecified: Secondary | ICD-10-CM | POA: Diagnosis not present

## 2021-02-02 DIAGNOSIS — D485 Neoplasm of uncertain behavior of skin: Secondary | ICD-10-CM | POA: Diagnosis not present

## 2021-02-17 DIAGNOSIS — E1169 Type 2 diabetes mellitus with other specified complication: Secondary | ICD-10-CM | POA: Diagnosis not present

## 2021-02-17 DIAGNOSIS — E669 Obesity, unspecified: Secondary | ICD-10-CM | POA: Diagnosis not present

## 2021-03-04 DIAGNOSIS — Z20822 Contact with and (suspected) exposure to covid-19: Secondary | ICD-10-CM | POA: Diagnosis not present

## 2021-03-16 ENCOUNTER — Ambulatory Visit: Payer: Medicare Other | Admitting: Internal Medicine

## 2021-03-22 DIAGNOSIS — Z20822 Contact with and (suspected) exposure to covid-19: Secondary | ICD-10-CM | POA: Diagnosis not present

## 2021-04-08 DIAGNOSIS — Z20822 Contact with and (suspected) exposure to covid-19: Secondary | ICD-10-CM | POA: Diagnosis not present

## 2021-04-14 DIAGNOSIS — Z20822 Contact with and (suspected) exposure to covid-19: Secondary | ICD-10-CM | POA: Diagnosis not present

## 2021-04-25 DIAGNOSIS — Z20822 Contact with and (suspected) exposure to covid-19: Secondary | ICD-10-CM | POA: Diagnosis not present

## 2021-05-16 DIAGNOSIS — Z20822 Contact with and (suspected) exposure to covid-19: Secondary | ICD-10-CM | POA: Diagnosis not present

## 2021-05-31 DIAGNOSIS — E1169 Type 2 diabetes mellitus with other specified complication: Secondary | ICD-10-CM | POA: Diagnosis not present

## 2021-05-31 DIAGNOSIS — E669 Obesity, unspecified: Secondary | ICD-10-CM | POA: Diagnosis not present

## 2021-05-31 DIAGNOSIS — I10 Essential (primary) hypertension: Secondary | ICD-10-CM | POA: Diagnosis not present

## 2021-05-31 LAB — HEMOGLOBIN A1C: Hemoglobin A1C: 7.2

## 2021-06-13 ENCOUNTER — Other Ambulatory Visit: Payer: Self-pay | Admitting: Internal Medicine

## 2021-06-13 DIAGNOSIS — Z1231 Encounter for screening mammogram for malignant neoplasm of breast: Secondary | ICD-10-CM

## 2021-07-09 ENCOUNTER — Other Ambulatory Visit: Payer: Self-pay | Admitting: Internal Medicine

## 2021-07-11 ENCOUNTER — Encounter: Payer: Self-pay | Admitting: Family Medicine

## 2021-07-11 ENCOUNTER — Ambulatory Visit: Payer: Medicare Other | Admitting: Family Medicine

## 2021-07-11 VITALS — Ht 63.0 in | Wt 218.0 lb

## 2021-07-11 DIAGNOSIS — Z0000 Encounter for general adult medical examination without abnormal findings: Secondary | ICD-10-CM

## 2021-07-11 NOTE — Telephone Encounter (Signed)
Appointment scheduled 07/26/21- courtesy RF given. Requested Prescriptions  Pending Prescriptions Disp Refills  . simvastatin (ZOCOR) 20 MG tablet [Pharmacy Med Name: SIMVASTATIN  TAB 20MG] 90 tablet 1    Sig: TAKE 1 TABLET DAILY     Cardiovascular:  Antilipid - Statins Failed - 07/09/2021  2:19 AM      Failed - Lipid Panel in normal range within the last 12 months    Cholesterol, Total  Date Value Ref Range Status  07/16/2020 155 100 - 199 mg/dL Final   LDL Chol Calc (NIH)  Date Value Ref Range Status  07/16/2020 66 0 - 99 mg/dL Final   HDL  Date Value Ref Range Status  07/16/2020 44 >39 mg/dL Final   Triglycerides  Date Value Ref Range Status  07/16/2020 285 (H) 0 - 149 mg/dL Final         Passed - Patient is not pregnant      Passed - Valid encounter within last 12 months    Recent Outpatient Visits          7 months ago Type II diabetes mellitus with complication [facility])   [facility] Glean [facility], MD   12 months ago Essential (primary) hypertension   [facility] Glean Hess, MD   1 year ago [facility] Glean Hess, MD   1 year ago Essential (primary) hypertension   [facility] Glean Hess, MD   1 year ago Essential (primary) hypertension   [facility] Glean Hess, MD      Future Appointments            In 2 weeks Army Melia Jesse Sans, MD [facility], [place]           . lisinopril-hydrochlorothiazide (ZESTORETIC) 20-12.5 MG tablet [Pharmacy Med Name: LISINOP/HCTZ TAB 20-12.5] 90 tablet 0    Sig: TAKE 1 TABLET DAILY     Cardiovascular:  ACEI + Diuretic Combos Failed - 07/09/2021  2:19 AM      Failed - Na in normal range and within 180 days    Sodium  Date Value Ref Range Status  07/16/2020 140 134 - 144 mmol/L Final         Failed - K in normal range and within 180 days    Potassium  Date Value Ref Range Status  07/16/2020 4.6 3.5 - 5.2 mmol/L Final          Failed - Cr in normal range and within 180 days    Creatinine, Ser  Date Value Ref Range Status  07/16/2020 0.69 0.57 - 1.00 mg/dL Final         Failed - eGFR is 30 or above and within 180 days    GFR calc Af Amer  Date Value Ref Range Status  11/06/2019 92 >59 mL/min/1.73 Final    Comment:    **In accordance with recommendations from the NKF-ASN Task force,**   [facility] is in the process of updating its eGFR calculation to the   2021 CKD-EPI creatinine equation that estimates kidney function   without a race variable.    GFR calc non Af Amer  Date Value Ref Range Status  11/06/2019 80 >59 mL/min/1.73 Final   eGFR  Date Value Ref Range Status  07/16/2020 92 >59 mL/min/1.73 Final         Failed - Valid encounter within last 6 months    Recent Outpatient Visits  7 months ago Type II diabetes mellitus with complication [facility])   [facility] Glean [facility], MD   12 months ago Essential (primary) hypertension   [facility] Glean [facility], MD   1 year ago [facility] Glean Hess, MD   1 year ago Essential (primary) hypertension   [facility], [facility]   1 year ago Essential (primary) hypertension   [facility] Glean Hess, MD      Future Appointments            In 2 weeks Glean Hess, MD [facility] [place] - Patient is not pregnant      Passed - Last BP in normal range    BP Readings from Last 1 Encounters:  11/16/20 108/74

## 2021-07-11 NOTE — Progress Notes (Signed)
I connected with  Nancy Chen on 07/11/21 by a audio enabled telemedicine application and verified that I am speaking with the correct person using two identifiers.  Patient Location: Home  Provider Location: Home Office  I discussed the limitations of evaluation and management by telemedicine. The patient expressed understanding and agreed to proceed.   Subjective:   Nancy Chen is a 76 y.o. female who presents for Medicare Annual (Subsequent) preventive examination.   Cardiac Risk Factors include: advanced age (>19mn, >>34women);diabetes mellitus;dyslipidemia;hypertension;sedentary lifestyle;obesity (BMI >30kg/m2)     Objective:    Today's Vitals   07/11/21 0952 07/11/21 0953  Weight: 218 lb (98.9 kg)   Height: '5\' 3"'$  (1.6 m)   PainSc:  6    Body mass index is 38.62 kg/m.     07/11/2021   10:01 AM 07/07/2020   10:17 AM 07/07/2019   10:24 AM 07/03/2018   11:03 AM 07/02/2017   11:26 AM 09/07/2016    6:44 AM 06/26/2016    1:58 PM  Advanced Directives  Does Patient Have a Medical Advance Directive? Yes Yes Yes Yes Yes Yes Yes  Type of AParamedicof ALake CityLiving will HTable RockLiving will HCold SpringsLiving will HRound LakeLiving will HGrassflatLiving will Living will;Healthcare Power of [place]Living will  Does patient want to make changes to medical advance directive?      No - Patient declined   Copy of HLasanain Chart?  Yes - validated most recent copy scanned in chart (See row information) Yes - validated most recent copy scanned in chart (See row information) Yes - validated most recent copy scanned in chart (See row information) Yes No - copy requested Yes    Current Medications (verified) Outpatient Encounter Medications as of 07/11/2021  Medication Sig   Acetaminophen (TYLENOL ARTHRITIS PAIN PO) Take 1 capsule  by mouth 2 (two) times daily.   albuterol (VENTOLIN HFA) 108 (90 Base) MCG/ACT inhaler Inhale 2 puffs into the lungs every 6 (six) hours as needed for wheezing or shortness of breath.   aspirin 81 MG chewable tablet Chew by mouth daily.   Bioflavonoids (BIOFLAVONOID-1000) 1000 MG TABS Take by mouth.   cetirizine (ZYRTEC) 10 MG tablet Take 10 mg by mouth as needed.    glucose blood test strip    lisinopril-hydrochlorothiazide (ZESTORETIC) 20-12.5 MG tablet TAKE 1 TABLET DAILY   metFORMIN (GLUCOPHAGE) 1000 MG tablet TAKE 1 TABLET TWICE A DAY   Multiple Vitamins-Minerals (PRESERVISION AREDS PO) Take by mouth.   naproxen sodium (ANAPROX) 220 MG tablet Take 220 mg by mouth 2 (two) times daily with a meal. PRN   Pyridoxine HCl (VITAMIN B6) 200 MG TABS Take 100 mg by mouth 2 (two) times daily.   simvastatin (ZOCOR) 20 MG tablet TAKE 1 TABLET DAILY   TRULICITY 1.5 MJK/9.3OISOPN INJECT 0.5ML (=1.5 MG)     SUBCUTANEOUSLY ONCE WEEKLY   [DISCONTINUED] lisinopril-hydrochlorothiazide (ZESTORETIC) 20-12.5 MG tablet TAKE 1 TABLET DAILY   [DISCONTINUED] simvastatin (ZOCOR) 20 MG tablet TAKE 1 TABLET DAILY   No facility-administered encounter medications on file as of 07/11/2021.    Allergies (verified) Glipizide and Codeine   History: Past Medical History:  Diagnosis Date   Allergy    Anemia    Came from menstrual issues   Anxiety    Arthritis    Bronchitis    Cataract 2018   COPD (chronic obstructive pulmonary  disease) ([place])    Diabetes mellitus without complication ([place])    Facial basal cell cancer 10/31/2016   also left lower eyelid, chest and shoulder   Hypercholesteremia    Hypertension    Long-term use of high-risk medication 11/10/2014   Actos started 11/2014    Lower extremity edema    Past Surgical History:  Procedure Laterality Date   ABDOMINAL HYSTERECTOMY     BASAL CELL CARCINOMA EXCISION     CARPAL TUNNEL RELEASE Bilateral    CARPAL TUNNEL RELEASE     CATARACT EXTRACTION  W/PHACO Left 09/07/2016   Procedure: CATARACT EXTRACTION PHACO AND INTRAOCULAR LENS PLACEMENT ([place]);  Surgeon: Eulogio Bear, MD;  Location: [facility] ORS;  Service: Ophthalmology;  Laterality: Left;  [place] 00:41.9 AP% 14.9 CDE 6.23 Fluid pack lot # [id number]   CATARACT EXTRACTION W/PHACO Right 10/19/2016   Procedure: CATARACT EXTRACTION PHACO AND INTRAOCULAR LENS PLACEMENT ([facility]);  Surgeon: Eulogio Bear, MD;  Location: [facility] ORS;  Service: Ophthalmology;  Laterality: Right;  casette lot # [id number] [place]  00:59.5 AP%   16.5 CDE   9.79    COLONOSCOPY  06/2012   normal (in [place])   EYE SURGERY     TONSILLECTOMY     TUBAL LIGATION     Family History  Problem Relation Age of Onset   Alzheimer's disease Mother    Arthritis Mother    CAD Father    Kidney disease Father    Stroke Father    Breast cancer Maternal Grandmother        great gm   Obesity Maternal Grandmother    Diabetes Maternal Aunt    Kidney disease Maternal Aunt    Social History   Socioeconomic History   Marital status: Widowed    Spouse name: Not on file   Number of children: 1   Years of education: Not on file   Highest education level: 12th grade  Occupational History   Occupation: Retired  Tobacco Use   Smoking status: Never   Smokeless tobacco: Never   Tobacco comments:    smoking cessation materials not required  Vaping Use   Vaping Use: Never used  Substance and Sexual Activity   Alcohol use: Not Currently    Alcohol/week: 1.0 standard drink of alcohol    Types: 1 Glasses of wine per week    Comment: once monthly maybe   Drug use: No   Sexual activity: Not Currently  Other Topics Concern   Not on file  Social History Narrative   Pt lives alone   Social Determinants of Health   Financial Resource Strain: Low Risk  (07/11/2021)   Overall Financial Resource Strain (CARDIA)    Difficulty of Paying Living Expenses: Not hard at all  Food Insecurity: No Food Insecurity (07/11/2021)   Hunger Vital Sign     Worried About Running Out of Food in the Last Year: Never true    Ran Out of Food in the Last Year: Never true  Transportation Needs: No Transportation Needs (07/11/2021)   PRAPARE - Hydrologist (Medical): No    Lack of Transportation (Non-Medical): No  Physical Activity: Inactive (07/11/2021)   Exercise Vital Sign    Days of Exercise per Week: 0 days    Minutes of Exercise per Session: 0 min  Stress: No Stress Concern Present (07/11/2021)   [place]    Feeling of Stress : Only a little  Social  Connections: Socially Isolated (07/11/2021)   Social Connection and Isolation Panel [NHANES]    Frequency of Communication with Friends and Family: More than three times a week    Frequency of Social Gatherings with Friends and Family: More than three times a week    Attends Religious Services: Never    Marine scientist or Organizations: No    Attends Archivist Meetings: Never    Marital Status: Widowed    Tobacco Counseling Counseling given: Not Answered Tobacco comments: smoking cessation materials not required   Clinical Intake:  Pre-visit preparation completed: No  Pain : 0-10 Pain Score: 6  Pain Type: Chronic pain Pain Location: Knee Pain Orientation: Right, Left        How often do you need to have someone help you when you read instructions, pamphlets, or other written materials from your doctor or pharmacy?: 1 - Never  Diabetic?yes   Nutrition Risk Assessment:  Has the patient had any N/V/D within the last 2 months?  No  Does the patient have any non-healing wounds?  No  Has the patient had any unintentional weight loss or weight gain?  No   Diabetes:  Is the patient diabetic?  Yes  If diabetic, was a CBG obtained today?  No  Did the patient bring in their glucometer from home?  No  How often do you monitor your CBG's? As needed.   Financial Strains  and Diabetes Management:  Are you having any financial strains with the device, your supplies or your medication? Yes .  Does the patient want to be seen by Chronic Care Management for management of their diabetes?  No  Would the patient like to be referred to a Nutritionist or for Diabetic Management?  No   Diabetic Exams:  Diabetic Eye Exam: Completed 2022 Diabetic Foot Exam: Completed 03/2020          Activities of Daily Living    07/11/2021   10:05 AM 07/06/2021    2:12 PM  In your present state of health, do you have any difficulty performing the following activities:  Hearing? 0 0  Vision? 0 0  Difficulty concentrating or making decisions? 0 0  Walking or climbing stairs? 1 1  Dressing or bathing? 0 0  Doing errands, shopping? 0 0  Preparing Food and eating ? N N  Using the Toilet? N N  In the past six months, have you accidently leaked urine? Y Y  Do you have problems with loss of bowel control?  N  Managing your Medications? N N  Managing your Finances? N N  Housekeeping or managing your Housekeeping? N N    Patient Care Team: Glean Hess, MD as PCP - General (Internal Medicine) Eulogio Bear, MD as Consulting Physician (Ophthalmology) Ree Edman, MD as Consulting Physician (Dermatology)  Indicate any recent Medical Services you may have received from other than [facility] providers in the past year (date may be approximate).     Assessment:   This is a routine wellness examination for [place].  Hearing/Vision screen Vision Screening - Comments:: 2022, next appt in 10/2021  Dietary issues and exercise activities discussed: Current Exercise Habits: The patient does not participate in regular exercise at present, Exercise limited by: orthopedic condition(s)   Goals Addressed   None   Depression Screen    07/11/2021   10:00 AM 11/16/2020   10:05 AM 07/16/2020   10:04 AM 07/07/2020   10:16 AM 04/27/2020   11:00 AM  03/09/2020   11:11 AM 11/06/2019     9:48 AM  PHQ 2/9 Scores  PHQ - 2 Score 0 0 0 0 0 0 0  PHQ- 9 Score  1 6  0 0 0    Fall Risk    07/11/2021   10:02 AM 07/06/2021    2:12 PM 11/16/2020   10:05 AM 07/16/2020   10:07 AM 07/07/2020   10:19 AM  Fall Risk   Falls in the past year? 1 1 0 0 0  Comment tripped      Number falls in past yr: 0 1 0  0  Injury with Fall? 1 0 0  0  Risk for fall due to : Impaired balance/gait;Impaired mobility;Orthopedic patient  History of fall(s);Impaired balance/gait  No Fall Risks  Follow up Falls evaluation completed;Education provided;Falls prevention discussed  Falls evaluation completed Falls evaluation completed Falls prevention discussed    FALL RISK PREVENTION PERTAINING TO THE HOME:  Any stairs in or around the home? Yes  If so, are there any without handrails? Yes  Home free of loose throw rugs in walkways, pet beds, electrical cords, etc? Yes  Adequate lighting in your home to reduce risk of falls? Yes   ASSISTIVE DEVICES UTILIZED TO PREVENT FALLS:  Life alert? No  Use of a cane, walker or w/c? Yes  Grab bars in the bathroom? Yes  Shower chair or bench in shower? Yes  Elevated toilet seat or a handicapped toilet? Yes     Cognitive Function:        07/11/2021   10:07 AM 07/07/2019   10:29 AM 07/03/2018   11:09 AM 07/02/2017   11:31 AM 06/26/2016    2:19 PM  6CIT Screen  What Year? 0 points 0 points 0 points 0 points 0 points  What month? 0 points 0 points 0 points 0 points 0 points  What time? 0 points 0 points 0 points 0 points 0 points  Count back from 20 0 points 0 points 0 points 0 points 0 points  Months in reverse 0 points 0 points 0 points 0 points 0 points  Repeat phrase 2 points 0 points 0 points 0 points 0 points  Total Score 2 points 0 points 0 points 0 points 0 points    Immunizations Immunization History  Administered Date(s) Administered   Hepatitis B 07/04/2011   Hepatitis B, adult 07/04/2011   Influenza, High Dose Seasonal PF 10/12/2016,  10/10/2017, 11/17/2020   Influenza,inj,Quad PF,6+ Mos 09/29/2013, 09/29/2014, 10/11/2015   Influenza-Unspecified 10/10/2018, 10/13/2019   PFIZER Comirnaty(Gray Top)Covid-19 Tri-Sucrose Vaccine 09/07/2020   PFIZER(Purple Top)SARS-COV-2 Vaccination 03/10/2019, 03/30/2019, 01/05/2020   Pfizer Covid-19 Vaccine Bivalent Booster 37yr & up 01/06/2021   Pneumococcal Conjugate-13 02/11/2015   Pneumococcal Polysaccharide-23 01/10/2005, 06/26/2016   Tdap 03/09/2012   Zoster Recombinat (Shingrix) 07/09/2017, 09/25/2017    TDAP status: Up to date  Flu Vaccine status: Up to date  Pneumococcal vaccine status: Up to date  Covid-19 vaccine status: Completed vaccines  Qualifies for Shingles Vaccine? Yes   Zostavax completed Yes   Shingrix Completed?: Yes  Screening Tests Health Maintenance  Topic Date Due   FOOT EXAM  07/13/2022 (Originally 03/09/2021)   MAMMOGRAM  07/27/2021   INFLUENZA VACCINE  08/09/2021   OPHTHALMOLOGY EXAM  10/22/2021   HEMOGLOBIN A1C  12/01/2021   TETANUS/TDAP  03/10/2022   COLONOSCOPY (Pts 45-435yrInsurance coverage will need to be confirmed)  06/24/2022   Pneumonia Vaccine 6510Years old  Completed   DEXA SCAN  Completed   COVID-19 Vaccine  Completed   Hepatitis C Screening  Completed   Zoster Vaccines- Shingrix  Completed   HPV VACCINES  Aged Out    Health Maintenance  There are no preventive care reminders to display for this patient.   Colorectal cancer screening: Type of screening: Colonoscopy. Completed declined. Repeat every no longer desired to have years  Mammogram status: Completed 07/2020. Repeat every year  Bone Density status: Completed  . Results reflect: Bone density results: OSTEOPOROSIS. Repeat every 2 years.  Lung Cancer Screening: (Low Dose CT Chest recommended if Age 30-80 years, 30 pack-year currently smoking OR have quit w/in 15years.) does not qualify.   Lung Cancer Screening Referral: n/a  Additional Screening:  Hepatitis C  Screening: does qualify; Completed   Vision Screening: Recommended annual ophthalmology exams for early detection of glaucoma and other disorders of the eye. Is the patient up to date with their annual eye exam?  Yes  Who is the provider or what is the name of the office in which the patient attends annual eye exams? Dr Edison Pace  If pt is not established with a provider, would they like to be referred to a provider to establish care? N/a.   Dental Screening: Recommended annual dental exams for proper oral hygiene  Community Resource Referral / Chronic Care Management: CRR required this visit?  No   CCM required this visit?  No      Plan:     I have personally reviewed and noted the following in the patient's chart:   Medical and social history Use of alcohol, tobacco or illicit drugs  Current medications and supplements including opioid prescriptions.  Functional ability and status Nutritional status Physical activity Advanced directives List of other physicians Hospitalizations, surgeries, and ER visits in previous 12 months Vitals Screenings to include cognitive, depression, and falls Referrals and appointments  In addition, I have reviewed and discussed with patient certain preventive protocols, quality metrics, and best practice recommendations. A written personalized care plan for preventive services as well as general preventive health recommendations were provided to patient.     Perlie Mayo, NP   07/11/2021

## 2021-07-11 NOTE — Patient Instructions (Signed)
Nancy Chen , Thank you for taking time to come for your Medicare Wellness Visit. I appreciate your ongoing commitment to your health goals. Please review the following plan we discussed and let me know if I can assist you in the future.   Screening recommendations/referrals: Colonoscopy: Completed Mammogram: July 2023 Bone Density: Up to date Recommended yearly ophthalmology/optometry visit for glaucoma screening and checkup Recommended yearly dental visit for hygiene and checkup  Vaccinations: Influenza vaccine: up to date Pneumococcal vaccine: Completed Tdap vaccine: Up to date until 2024 Shingles vaccine: Completed  Advanced directives: Reports completed and copies in chart  Conditions/risks identified: Falls/trips are a hazard and risk for [place] all. Continue to use your assistive devices to help you move well and safely.  Next appointment: as scheduled   Preventive Care 18 Years and Older, Female Preventive care refers to lifestyle choices and visits with your health care provider that can promote health and wellness. What does preventive care include? A yearly physical exam. This is also called an annual well check. Dental exams once or twice a year. Routine eye exams. Ask your health care provider how often you should have your eyes checked. Personal lifestyle choices, including: Daily care of your teeth and gums. Regular physical activity. Eating a healthy diet. Avoiding tobacco and drug use. Limiting alcohol use. Practicing safe sex. Taking low-dose aspirin every day. Taking vitamin and mineral supplements as recommended by your health care provider. What happens during an annual well check? The services and screenings done by your health care provider during your annual well check will depend on your age, overall health, lifestyle risk factors, and family history of disease. Counseling  Your health care provider may ask you questions about your: Alcohol use. Tobacco  use. Drug use. Emotional well-being. Home and relationship well-being. Sexual activity. Eating habits. History of falls. Memory and ability to understand (cognition). Work and work Statistician. Reproductive health. Screening  You may have the following tests or measurements: Height, weight, and BMI. Blood pressure. Lipid and cholesterol levels. These may be checked every 5 years, or more frequently if you are over 54 years old. Skin check. Lung cancer screening. You may have this screening every year starting at age 59 if you have a 30-pack-year history of smoking and currently smoke or have quit within the past 15 years. Fecal occult blood test (FOBT) of the stool. You may have this test every year starting at age 55. Flexible sigmoidoscopy or colonoscopy. You may have a sigmoidoscopy every 5 years or a colonoscopy every 10 years starting at age 55. Hepatitis C blood test. Hepatitis B blood test. Sexually transmitted disease (STD) testing. Diabetes screening. This is done by checking your blood sugar (glucose) after you have not eaten for a while (fasting). You may have this done every 1-3 years. Bone density scan. This is done to screen for osteoporosis. You may have this done starting at age 73. Mammogram. This may be done every 1-2 years. Talk to your health care provider about how often you should have regular mammograms. Talk with your health care provider about your test results, treatment options, and if necessary, the need for more tests. Vaccines  Your health care provider may recommend certain vaccines, such as: Influenza vaccine. This is recommended every year. Tetanus, diphtheria, and acellular pertussis (Tdap, Td) vaccine. You may need a Td booster every 10 years. Zoster vaccine. You may need this after age 35. Pneumococcal 13-valent conjugate (PCV13) vaccine. One dose is recommended after age 30.  Pneumococcal polysaccharide (PPSV23) vaccine. One dose is recommended  after age 73. Talk to your health care provider about which screenings and vaccines you need and how often you need them. This information is not intended to replace advice given to you by your health care provider. Make sure you discuss any questions you have with your health care provider. Document Released: 01/22/2015 Document Revised: 09/15/2015 Document Reviewed: 10/27/2014 Elsevier Interactive Patient Education  2017 [place] Prevention in the Home Falls can cause injuries. They can happen to people of all ages. There are many things you can do to make your home safe and to help prevent falls. What can I do on the outside of my home? Regularly fix the edges of walkways and driveways and fix any cracks. Remove anything that might make you trip as you walk through a door, such as a raised step or threshold. Trim any bushes or trees on the path to your home. Use bright outdoor lighting. Clear any walking paths of anything that might make someone trip, such as rocks or tools. Regularly check to see if handrails are loose or broken. Make sure that both sides of any steps have handrails. Any raised decks and porches should have guardrails on the edges. Have any leaves, snow, or ice cleared regularly. Use sand or salt on walking paths during winter. Clean up any spills in your garage right away. This includes oil or grease spills. What can I do in the bathroom? Use night lights. Install grab bars by the toilet and in the tub and shower. Do not use towel bars as grab bars. Use non-skid mats or decals in the tub or shower. If you need to sit down in the shower, use a plastic, non-slip stool. Keep the floor dry. Clean up any water that spills on the floor as soon as it happens. Remove soap buildup in the tub or shower regularly. Attach bath mats securely with double-sided non-slip rug tape. Do not have throw rugs and other things on the floor that can make you trip. What can I do  in the bedroom? Use night lights. Make sure that you have a light by your bed that is easy to reach. Do not use any sheets or blankets that are too big for your bed. They should not hang down onto the floor. Have a firm chair that has side arms. You can use this for support while you get dressed. Do not have throw rugs and other things on the floor that can make you trip. What can I do in the kitchen? Clean up any spills right away. Avoid walking on wet floors. Keep items that you use a lot in easy-to-reach places. If you need to reach something above you, use a strong step stool that has a grab bar. Keep electrical cords out of the way. Do not use floor polish or wax that makes floors slippery. If you must use wax, use non-skid floor wax. Do not have throw rugs and other things on the floor that can make you trip. What can I do with my stairs? Do not leave any items on the stairs. Make sure that there are handrails on both sides of the stairs and use them. Fix handrails that are broken or loose. Make sure that handrails are as long as the stairways. Check any carpeting to make sure that it is firmly attached to the stairs. Fix any carpet that is loose or worn. Avoid having throw rugs at the  top or bottom of the stairs. If you do have throw rugs, attach them to the floor with carpet tape. Make sure that you have a light switch at the top of the stairs and the bottom of the stairs. If you do not have them, ask someone to add them for you. What else can I do to help prevent falls? Wear shoes that: Do not have high heels. Have rubber bottoms. Are comfortable and fit you well. Are closed at the toe. Do not wear sandals. If you use a stepladder: Make sure that it is fully opened. Do not climb a closed stepladder. Make sure that both sides of the stepladder are locked into place. Ask someone to hold it for you, if possible. Clearly mark and make sure that you can see: Any grab bars or  handrails. First and last steps. Where the edge of each step is. Use tools that help you move around (mobility aids) if they are needed. These include: Canes. Walkers. Scooters. Crutches. Turn on the lights when you go into a dark area. Replace any light bulbs as soon as they burn out. Set up your furniture so you have a clear path. Avoid moving your furniture around. If any of your floors are uneven, fix them. If there are any pets around you, be aware of where they are. Review your medicines with your doctor. Some medicines can make you feel dizzy. This can increase your chance of falling. Ask your doctor what other things that you can do to help prevent falls. This information is not intended to replace advice given to you by your health care provider. Make sure you discuss any questions you have with your health care provider. Document Released: 10/22/2008 Document Revised: 06/03/2015 Document Reviewed: 01/30/2014 Elsevier Interactive Patient Education  2017 [employer].

## 2021-07-18 ENCOUNTER — Encounter: Payer: Medicare Other | Admitting: Internal Medicine

## 2021-07-26 ENCOUNTER — Ambulatory Visit (INDEPENDENT_AMBULATORY_CARE_PROVIDER_SITE_OTHER): Payer: Medicare Other | Admitting: Internal Medicine

## 2021-07-26 ENCOUNTER — Encounter: Payer: Self-pay | Admitting: Internal Medicine

## 2021-07-26 VITALS — BP 123/78 | HR 94 | Ht 63.0 in | Wt 221.0 lb

## 2021-07-26 DIAGNOSIS — E1169 Type 2 diabetes mellitus with other specified complication: Secondary | ICD-10-CM

## 2021-07-26 DIAGNOSIS — E118 Type 2 diabetes mellitus with unspecified complications: Secondary | ICD-10-CM | POA: Diagnosis not present

## 2021-07-26 DIAGNOSIS — Z6841 Body Mass Index (BMI) 40.0 and over, adult: Secondary | ICD-10-CM | POA: Diagnosis not present

## 2021-07-26 DIAGNOSIS — I700 Atherosclerosis of aorta: Secondary | ICD-10-CM | POA: Diagnosis not present

## 2021-07-26 DIAGNOSIS — E785 Hyperlipidemia, unspecified: Secondary | ICD-10-CM

## 2021-07-26 DIAGNOSIS — I10 Essential (primary) hypertension: Secondary | ICD-10-CM | POA: Diagnosis not present

## 2021-07-26 NOTE — Progress Notes (Signed)
Date:  07/26/2021   Name:  Nancy Chen   DOB:  January 24, 1945   MRN:  [id number]   Chief Complaint: Annual Exam (Breast exam) Nancy Chen is a 76 y.o. female who presents today for her Complete Annual Exam. She feels fairly well. She reports exercising none. She reports she is sleeping fairly well. Breast complaints - none.  Mammogram: scheduled 07/28/21 DEXA: 08/2019 Pap smear: discontinued Colonoscopy: 06/2012  There are no preventive care reminders to display for this patient.  Immunization History  Administered Date(s) Administered   Hepatitis B 07/04/2011   Hepatitis B, adult 07/04/2011   Influenza, High Dose Seasonal PF 10/12/2016, 10/10/2017, 11/17/2020   Influenza,inj,Quad PF,6+ Mos 09/29/2013, 09/29/2014, 10/11/2015   Influenza-Unspecified 10/10/2018, 10/13/2019   PFIZER Comirnaty(Gray Top)Covid-19 Tri-Sucrose Vaccine 09/07/2020   PFIZER(Purple Top)SARS-COV-2 Vaccination 03/10/2019, 03/30/2019, 01/05/2020   Pfizer Covid-19 Vaccine Bivalent Booster 48yr & up 01/06/2021   Pneumococcal Conjugate-13 02/11/2015   Pneumococcal Polysaccharide-23 01/10/2005, 06/26/2016   Tdap 03/09/2012   Zoster Recombinat (Shingrix) 07/09/2017, 09/25/2017    Hypertension This is a chronic problem. The problem is controlled. Pertinent negatives include no chest pain, headaches, palpitations or shortness of breath. Past treatments include angiotensin blockers. The current treatment provides significant improvement. There is no history of kidney disease, CAD/MI or CVA.  Diabetes She presents for her follow-up diabetic visit. She has type 2 diabetes mellitus. Her disease course has been improving. Pertinent negatives for hypoglycemia include no dizziness, headaches, nervousness/anxiousness or tremors. Pertinent negatives for diabetes include no chest pain, no fatigue, no polydipsia and no polyuria. Pertinent negatives for diabetic complications include no CVA. Current diabetic treatment includes  oral agent (monotherapy) (metformin; Trulicity dose increased to 3 mg). An ACE inhibitor/angiotensin II receptor blocker is being taken. Eye exam is current.  Hyperlipidemia This is a chronic problem. The problem is controlled. Pertinent negatives include no chest pain or shortness of breath. Current antihyperlipidemic treatment includes statins. The current treatment provides significant improvement of lipids.    Lab Results  Component Value Date   NA 140 07/16/2020   K 4.6 07/16/2020   CO2 24 07/16/2020   GLUCOSE 153 (H) 07/16/2020   BUN 17 07/16/2020   CREATININE 0.69 07/16/2020   CALCIUM 10.0 07/16/2020   EGFR 92 07/16/2020   GFRNONAA 80 11/06/2019   Lab Results  Component Value Date   CHOL 155 07/16/2020   HDL 44 07/16/2020   LDLCALC 66 07/16/2020   TRIG 285 (H) 07/16/2020   CHOLHDL 3.5 07/16/2020   Lab Results  Component Value Date   TSH 1.320 07/16/2020   Lab Results  Component Value Date   HGBA1C 7.2 05/31/2021   Lab Results  Component Value Date   WBC 5.4 07/16/2020   HGB 13.9 07/16/2020   HCT 43.0 07/16/2020   MCV 89 07/16/2020   PLT 241 07/16/2020   Lab Results  Component Value Date   ALT 37 (H) 07/16/2020   AST 33 07/16/2020   ALKPHOS 51 07/16/2020   BILITOT 0.5 07/16/2020   No results found for: "25OHVITD2", "25OHVITD3", "VD25OH"   Review of Systems  Constitutional:  Negative for chills, fatigue and fever.  HENT:  Negative for congestion, hearing loss, tinnitus, trouble swallowing and voice change.   Eyes:  Negative for visual disturbance.  Respiratory:  Negative for cough, chest tightness, shortness of breath and wheezing.   Cardiovascular:  Negative for chest pain, palpitations and leg swelling.  Gastrointestinal:  Negative for abdominal pain, constipation, diarrhea and vomiting.  Endocrine: Negative for polydipsia and polyuria.  Genitourinary:  Negative for dysuria, frequency, genital sores, vaginal bleeding and vaginal discharge.   Musculoskeletal:  Positive for arthralgias and gait problem. Negative for joint swelling.  Skin:  Negative for color change and rash.  Neurological:  Negative for dizziness, tremors, light-headedness and headaches.  Hematological:  Negative for adenopathy. Does not bruise/bleed easily.  Psychiatric/Behavioral:  Negative for dysphoric mood and sleep disturbance. The patient is not nervous/anxious.     Patient Active Problem List   Diagnosis Date Noted   Aortic atherosclerosis ([place]) 07/15/2019   BMI 40.0-44.9, adult ([place]) 03/12/2019   Facial basal cell cancer 10/31/2016   Migraine 10/31/2016   Lipoma of left upper extremity 10/11/2015   Type II diabetes mellitus with complication ([place]) 68/12/7515   Ovarian failure 11/10/2014   Essential (primary) hypertension 07/15/2014   Hyperlipidemia associated with type 2 diabetes mellitus ([place]) 07/15/2014   Tricompartment osteoarthritis of knee 07/15/2014    Allergies  Allergen Reactions   Glipizide Swelling    hives   Codeine Hives    Past Surgical History:  Procedure Laterality Date   ABDOMINAL HYSTERECTOMY     BASAL CELL CARCINOMA EXCISION     CARPAL TUNNEL RELEASE Bilateral    CARPAL TUNNEL RELEASE     CATARACT EXTRACTION W/PHACO Left 09/07/2016   Procedure: CATARACT EXTRACTION PHACO AND INTRAOCULAR LENS PLACEMENT ([place]);  Surgeon: Eulogio Bear, MD;  Location: [facility] ORS;  Service: Ophthalmology;  Laterality: Left;  [place] 00:41.9 AP% 14.9 CDE 6.23 Fluid pack lot # [id number]   CATARACT EXTRACTION W/PHACO Right 10/19/2016   Procedure: CATARACT EXTRACTION PHACO AND INTRAOCULAR LENS PLACEMENT ([facility]);  Surgeon: Eulogio Bear, MD;  Location: [facility] ORS;  Service: Ophthalmology;  Laterality: Right;  casette lot # [id number] [place]  00:59.5 AP%   16.5 CDE   9.79    COLONOSCOPY  06/2012   normal (in [place])   EYE SURGERY     TONSILLECTOMY     TUBAL LIGATION      Social History   Tobacco Use   Smoking status: Never   Smokeless tobacco:  Never   Tobacco comments:    smoking cessation materials not required  Vaping Use   Vaping Use: Never used  Substance Use Topics   Alcohol use: Not Currently    Alcohol/week: 1.0 standard drink of alcohol    Types: 1 Glasses of wine per week    Comment: once monthly maybe   Drug use: No     Medication list has been reviewed and updated.  Current Meds  Medication Sig   Acetaminophen (TYLENOL ARTHRITIS PAIN PO) Take 1 capsule by mouth 2 (two) times daily.   aspirin 81 MG chewable tablet Chew by mouth daily.   Bioflavonoids (BIOFLAVONOID-1000) 1000 MG TABS Take by mouth.   cetirizine (ZYRTEC) 10 MG tablet Take 10 mg by mouth as needed.    Dulaglutide (TRULICITY) 3 MB/8.4YK SOPN Inject into the skin once a week.   glucose blood test strip    lisinopril-hydrochlorothiazide (ZESTORETIC) 20-12.5 MG tablet TAKE 1 TABLET DAILY   metFORMIN (GLUCOPHAGE) 1000 MG tablet Take 1,000 mg by mouth 2 (two) times daily with a meal.   Multiple Vitamins-Minerals (PRESERVISION AREDS PO) Take by mouth.   naproxen sodium (ANAPROX) 220 MG tablet Take 220 mg by mouth 2 (two) times daily with a meal. PRN   Pyridoxine HCl (VITAMIN B6) 200 MG TABS Take 100 mg by mouth 2 (two) times daily.   simvastatin (ZOCOR) 20 MG  tablet TAKE 1 TABLET DAILY   Turmeric (QC TUMERIC COMPLEX PO) Take by mouth.   [DISCONTINUED] metFORMIN (GLUCOPHAGE) 1000 MG tablet TAKE 1 TABLET TWICE A DAY   [DISCONTINUED] TRULICITY 1.5 GY/6.9SW SOPN INJECT 0.5ML (=1.5 MG)     SUBCUTANEOUSLY ONCE WEEKLY (Patient taking differently: 3 mg.)       07/26/2021   10:50 AM 11/16/2020   10:05 AM 07/16/2020   10:06 AM 04/27/2020   11:00 AM  GAD 7 : Generalized Anxiety Score  Nervous, Anxious, on Edge 0 0 0 0  Control/stop worrying 0 0 0 0  Worry too much - different things 0 0 0 0  Trouble relaxing 0 0 0 0  Restless 0 0 0 0  Easily annoyed or irritable 0 0 0 0  Afraid - awful might happen 0 0 0 0  Total GAD 7 Score 0 0 0 0  Anxiety Difficulty  Not difficult at all Not difficult at all  Not difficult at all       07/26/2021   10:50 AM 07/11/2021   10:00 AM 11/16/2020   10:05 AM  Depression screen PHQ 2/9  Decreased Interest 0 0 0  Down, Depressed, Hopeless 0 0 0  PHQ - 2 Score 0 0 0  Altered sleeping 1  1  Tired, decreased energy 1  0  Change in appetite 0  0  Feeling bad or failure about yourself  0  0  Trouble concentrating 0  0  Moving slowly or fidgety/restless 0  0  Suicidal thoughts 0  0  PHQ-9 Score 2  1  Difficult doing work/chores Not difficult at all  Not difficult at all    BP Readings from Last 3 Encounters:  07/26/21 123/78  11/16/20 108/74  07/16/20 124/78    Physical Exam Vitals and nursing note reviewed.  Constitutional:      General: She is not in acute distress.    Appearance: She is well-developed.  HENT:     Head: Normocephalic and atraumatic.     Right Ear: Tympanic membrane and ear canal normal.     Left Ear: Tympanic membrane and ear canal normal.     Nose:     Right Sinus: No maxillary sinus tenderness.     Left Sinus: No maxillary sinus tenderness.  Eyes:     General: No scleral icterus.       Right eye: No discharge.        Left eye: No discharge.     Conjunctiva/sclera: Conjunctivae normal.  Neck:     Thyroid: No thyromegaly.     Vascular: No carotid bruit.  Cardiovascular:     Rate and Rhythm: Normal rate and regular rhythm.     Pulses: Normal pulses.     Heart sounds: Normal heart sounds.  Pulmonary:     Effort: Pulmonary effort is normal. No respiratory distress.     Breath sounds: No wheezing.  Chest:  Breasts:    Right: No mass, nipple discharge, skin change or tenderness.     Left: No mass, nipple discharge, skin change or tenderness.  Abdominal:     General: Bowel sounds are normal.     Palpations: Abdomen is soft.     Tenderness: There is no abdominal tenderness.  Musculoskeletal:        General: Swelling and tenderness present.     Cervical back: Normal range  of motion. No erythema.     Right lower leg: No edema.  Left lower leg: No edema.  Lymphadenopathy:     Cervical: No cervical adenopathy.  Skin:    General: Skin is warm and dry.     Findings: No rash.  Neurological:     Mental Status: She is alert and oriented to person, place, and time.     Cranial Nerves: No cranial nerve deficit.     Sensory: No sensory deficit.     Deep Tendon Reflexes: Reflexes are normal and symmetric.  Psychiatric:        Attention and Perception: Attention normal.        Mood and Affect: Mood normal.     Wt Readings from Last 3 Encounters:  07/26/21 221 lb (100.2 kg)  07/11/21 218 lb (98.9 kg)  11/16/20 208 lb 12.8 oz (94.7 kg)    BP 123/78 (Cuff Size: Large)   Pulse 94   Ht 5' 3" (1.6 m)   Wt 221 lb (100.2 kg)   SpO2 95%   BMI 39.15 kg/m   Assessment and Plan: 1. Essential (primary) hypertension Clinically stable exam with well controlled BP. Tolerating medications without side effects at this time. Pt to continue current regimen and low sodium diet; benefits of regular exercise as able discussed. - CBC with Differential/Platelet - Comprehensive metabolic panel - TSH - Urinalysis, Routine w reflex microscopic  2. Hyperlipidemia associated with type 2 diabetes mellitus ([place]) Tolerating statin medication without side effects at this time LDL is at goal of < 70 on current dose Continue same therapy without change at this time. - Lipid panel  3. Type II diabetes mellitus with complication ([facility]) Clinically stable by exam and report without s/s of hypoglycemia. DM complicated by hypertension and dyslipidemia.  Tolerating medications well without side effects or other concerns. Recent A1C at [facility] slightly higher so Trulicity was increased. Eye exam is scheduled. - Comprehensive metabolic panel - Microalbumin / creatinine urine ratio  4. Aortic atherosclerosis ([facility]) Continue statin and aspirin - Lipid panel  5. BMI 40.0-44.9, adult  ([place]) Continue efforts to reduce weight with diet changes  HM - aged out of Colonoscopy. Mammogram is scheduled. Immunizations are up to date.  Partially dictated using Editor, commissioning. Any errors are unintentional.  Halina Maidens, MD [facility]  07/26/2021

## 2021-07-28 ENCOUNTER — Ambulatory Visit
Admission: RE | Admit: 2021-07-28 | Discharge: 2021-07-28 | Disposition: A | Discharge status: Home / Self Care | Payer: Medicare Other | Source: Ambulatory Visit | Attending: Internal Medicine | Admitting: Internal Medicine

## 2021-07-28 DIAGNOSIS — Z1231 Encounter for screening mammogram for malignant neoplasm of breast: Secondary | ICD-10-CM | POA: Diagnosis not present

## 2021-07-28 LAB — CBC WITH DIFFERENTIAL/PLATELET
Basophils Absolute: 0 10*3/uL (ref 0.0–0.2)
Basos: 1 %
EOS (ABSOLUTE): 0.1 10*3/uL (ref 0.0–0.4)
Eos: 2 %
Hematocrit: 42.7 % (ref 34.0–46.6)
Hemoglobin: 14.1 g/dL (ref 11.1–15.9)
Immature Grans (Abs): 0 10*3/uL (ref 0.0–0.1)
Immature Granulocytes: 0 %
Lymphocytes Absolute: 1.9 10*3/uL (ref 0.7–3.1)
Lymphs: 35 %
MCH: 29.4 pg (ref 26.6–33.0)
MCHC: 33 g/dL (ref 31.5–35.7)
MCV: 89 fL (ref 79–97)
Monocytes Absolute: 0.5 10*3/uL (ref 0.1–0.9)
Monocytes: 10 %
Neutrophils Absolute: 2.8 10*3/uL (ref 1.4–7.0)
Neutrophils: 52 %
Platelets: 237 10*3/uL (ref 150–450)
RBC: 4.79 x10E6/uL (ref 3.77–5.28)
RDW: 13.1 % (ref 11.7–15.4)
WBC: 5.3 10*3/uL (ref 3.4–10.8)

## 2021-07-28 LAB — URINALYSIS, ROUTINE W REFLEX MICROSCOPIC
Bilirubin, UA: NEGATIVE
Glucose, UA: NEGATIVE
Ketones, UA: NEGATIVE
Nitrite, UA: NEGATIVE
RBC, UA: NEGATIVE
Specific Gravity, UA: 1.028 (ref 1.005–1.030)
Urobilinogen, Ur: 0.2 mg/dL (ref 0.2–1.0)
pH, UA: 6 (ref 5.0–7.5)

## 2021-07-28 LAB — COMPREHENSIVE METABOLIC PANEL
ALT: 26 IU/L (ref 0–32)
AST: 25 IU/L (ref 0–40)
Albumin/Globulin Ratio: 2 (ref 1.2–2.2)
Albumin: 4.9 g/dL — ABNORMAL HIGH (ref 3.8–4.8)
Alkaline Phosphatase: 63 IU/L (ref 44–121)
BUN/Creatinine Ratio: 29 — ABNORMAL HIGH (ref 12–28)
BUN: 19 mg/dL (ref 8–27)
Bilirubin Total: 0.5 mg/dL (ref 0.0–1.2)
CO2: 25 mmol/L (ref 20–29)
Calcium: 10.3 mg/dL (ref 8.7–10.3)
Chloride: 101 mmol/L (ref 96–106)
Creatinine, Ser: 0.65 mg/dL (ref 0.57–1.00)
Globulin, Total: 2.4 g/dL (ref 1.5–4.5)
Glucose: 127 mg/dL — ABNORMAL HIGH (ref 70–99)
Potassium: 3.9 mmol/L (ref 3.5–5.2)
Sodium: 141 mmol/L (ref 134–144)
Total Protein: 7.3 g/dL (ref 6.0–8.5)
eGFR: 92 mL/min/{1.73_m2} (ref >59)

## 2021-07-28 LAB — MICROALBUMIN / CREATININE URINE RATIO
Creatinine, Urine: 131.4 mg/dL
Microalb/Creat Ratio: 33 mg/g creat — ABNORMAL HIGH (ref 0–29)
Microalbumin, Urine: 43.5 ug/mL

## 2021-07-28 LAB — LIPID PANEL
Chol/HDL Ratio: 3 ratio (ref 0.0–4.4)
Cholesterol, Total: 163 mg/dL (ref 100–199)
HDL: 54 mg/dL (ref >39)
LDL Chol Calc (NIH): 80 mg/dL (ref 0–99)
Triglycerides: 172 mg/dL — ABNORMAL HIGH (ref 0–149)
VLDL Cholesterol Cal: 29 mg/dL (ref 5–40)

## 2021-07-28 LAB — MICROSCOPIC EXAMINATION
Bacteria, UA: NONE SEEN
Casts: NONE SEEN /lpf

## 2021-07-28 LAB — TSH: TSH: 1.11 u[IU]/mL (ref 0.450–4.500)

## 2021-08-02 DIAGNOSIS — Z859 Personal history of malignant neoplasm, unspecified: Secondary | ICD-10-CM | POA: Diagnosis not present

## 2021-08-02 DIAGNOSIS — L578 Other skin changes due to chronic exposure to nonionizing radiation: Secondary | ICD-10-CM | POA: Diagnosis not present

## 2021-08-02 DIAGNOSIS — Z872 Personal history of diseases of the skin and subcutaneous tissue: Secondary | ICD-10-CM | POA: Diagnosis not present

## 2021-08-02 DIAGNOSIS — Z85828 Personal history of other malignant neoplasm of skin: Secondary | ICD-10-CM | POA: Diagnosis not present

## 2021-08-02 DIAGNOSIS — Z86018 Personal history of other benign neoplasm: Secondary | ICD-10-CM | POA: Diagnosis not present

## 2021-09-20 DIAGNOSIS — E669 Obesity, unspecified: Secondary | ICD-10-CM | POA: Diagnosis not present

## 2021-09-20 DIAGNOSIS — E1169 Type 2 diabetes mellitus with other specified complication: Secondary | ICD-10-CM | POA: Diagnosis not present

## 2021-09-20 LAB — HEMOGLOBIN A1C: Hemoglobin A1C: 7.5

## 2021-09-27 DIAGNOSIS — E669 Obesity, unspecified: Secondary | ICD-10-CM | POA: Diagnosis not present

## 2021-09-27 DIAGNOSIS — E1169 Type 2 diabetes mellitus with other specified complication: Secondary | ICD-10-CM | POA: Diagnosis not present

## 2021-09-27 DIAGNOSIS — E1129 Type 2 diabetes mellitus with other diabetic kidney complication: Secondary | ICD-10-CM | POA: Diagnosis not present

## 2021-09-27 DIAGNOSIS — R809 Proteinuria, unspecified: Secondary | ICD-10-CM | POA: Diagnosis not present

## 2021-10-03 DIAGNOSIS — H9313 Tinnitus, bilateral: Secondary | ICD-10-CM | POA: Diagnosis not present

## 2021-10-03 DIAGNOSIS — H903 Sensorineural hearing loss, bilateral: Secondary | ICD-10-CM | POA: Diagnosis not present

## 2021-10-03 DIAGNOSIS — J301 Allergic rhinitis due to pollen: Secondary | ICD-10-CM | POA: Diagnosis not present

## 2021-10-03 DIAGNOSIS — H6123 Impacted cerumen, bilateral: Secondary | ICD-10-CM | POA: Diagnosis not present

## 2021-10-03 IMAGING — MR MR BRAIN/IAC WO/W CM
10 of 14 series · 26 of 48 positions shown · IV contrast (gadavist)
Comparison: No pertinent prior exams are available for comparison.

CLINICAL DATA: Tinnitus, unspecified laterality. Additional history
provided by scanning technologist: Patient reports prior falls, 3
falls in the last year due to balance difficulty/dizziness, 2 falls
in the last 3 months while sleepwalking; right side head injury
during the most recent fall, constant "ringing and "bilaterally for
3-4 weeks.

EXAM:
MRI HEAD WITHOUT AND WITH CONTRAST
TECHNIQUE: Multiplanar, multiecho pulse sequences of the brain and surrounding
structures were obtained without and with intravenous contrast.
CONTRAST:  10mL GADAVIST GADOBUTROL 1 MMOL/ML IV SOLN

[Series 5: T1 · sagittal · 5.0mm · 0.62mm/px · 3 of 25 slices shown (1 of 3)]
[im 1/25]
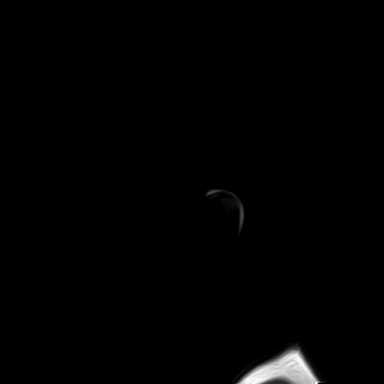
[im 13/25]
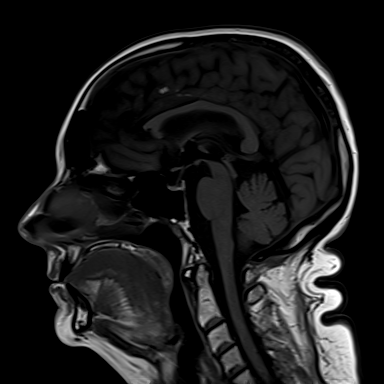
[im 25/25]
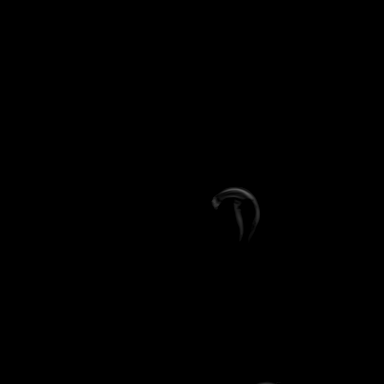

[Series 6: T2 · axial · 5.0mm · 0.53mm/px · z∈[-60,+82]mm · 2 of 25 slices shown]
[im 1/25]
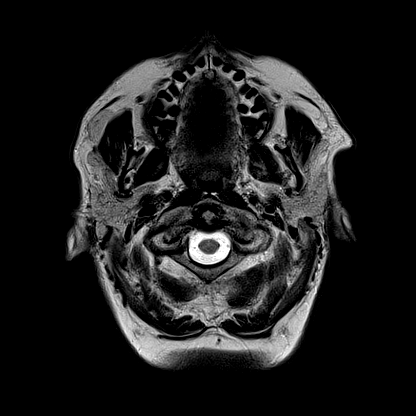
[im 25/25]
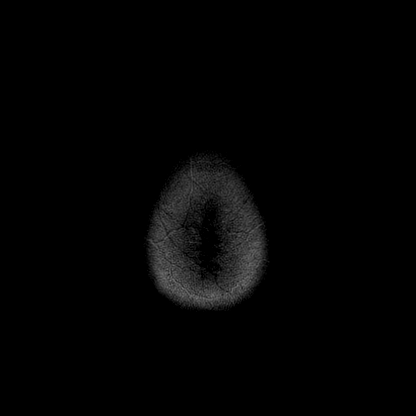

[Series 7: ax dwi_tracew · axial · 3.0mm · 0.60mm/px · z∈[-66,+86]mm · 3 of 48 slices shown]
[im 1/48]
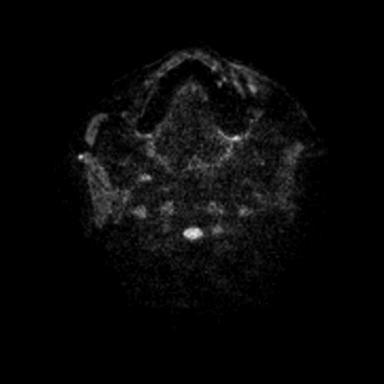
[im 24/48]
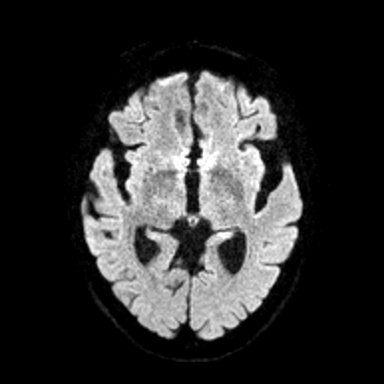
[im 48/48]
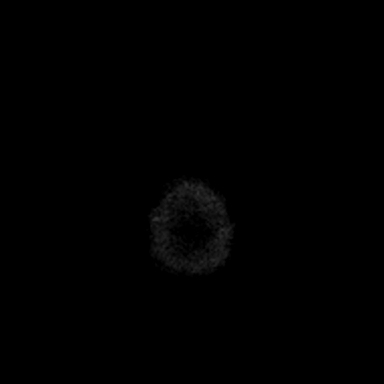

[Series 8: ax dwi_adc · axial · 3.0mm · 0.60mm/px · 1 of 48 slices shown]
[im 1/48]
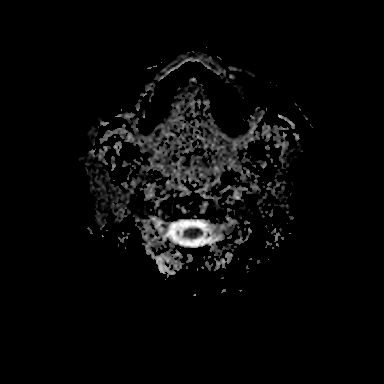

[Series 9: T1 · coronal · non-contrast · 3.0mm · 0.21mm/px · 1 of 13 slices shown (2 of 3)]
[im 1/13]
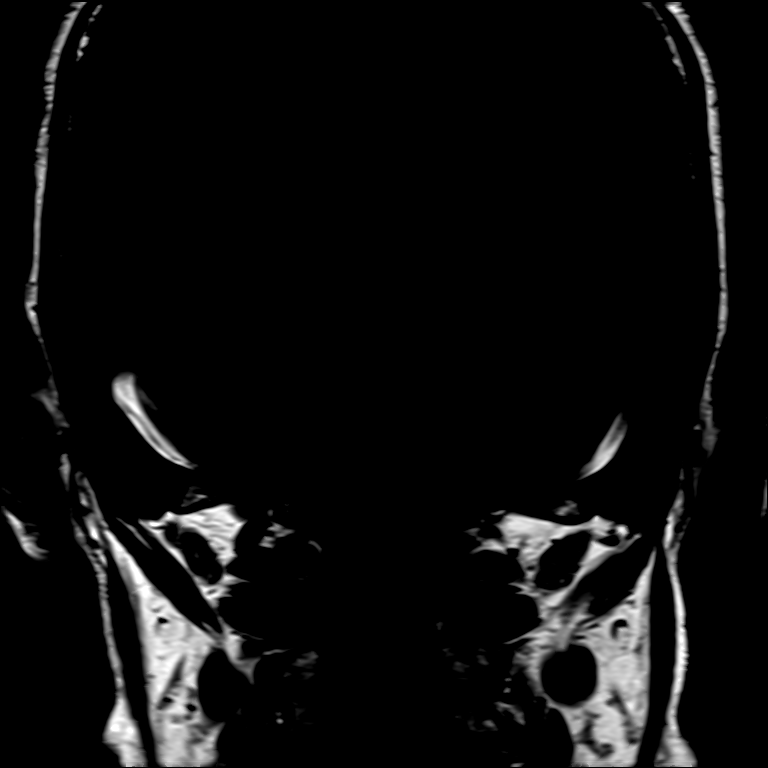

[Series 14: FLAIR · axial · 3.0mm · 0.53mm/px · z∈[-69,+90]mm · 4 of 55 slices shown]
[im 1/55]
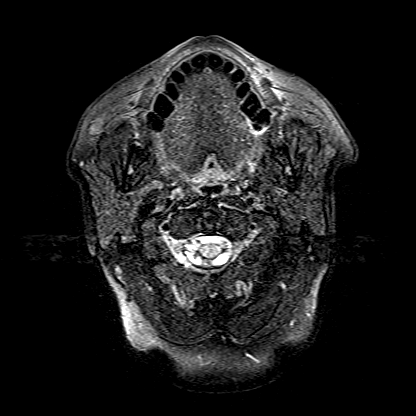
[im 19/55]
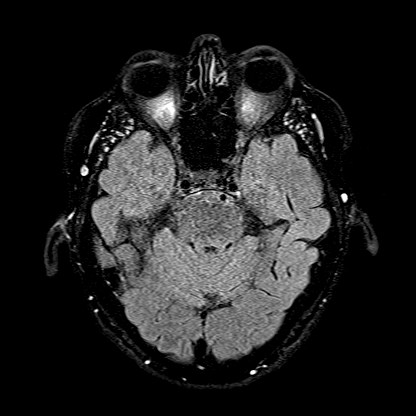
[im 37/55]
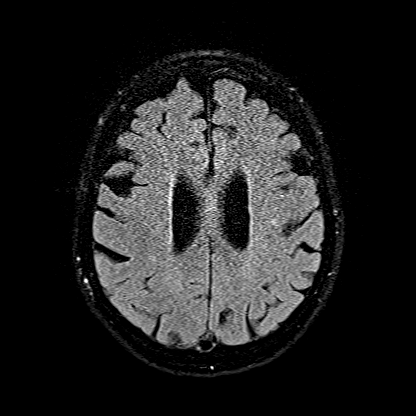
[im 55/55]
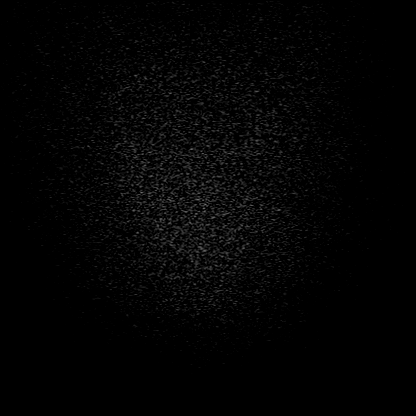

[Series 16: T1 · axial · non-contrast · 3.0mm · 0.21mm/px · 1 of 15 slices shown (3 of 3)]
[im 1/15]
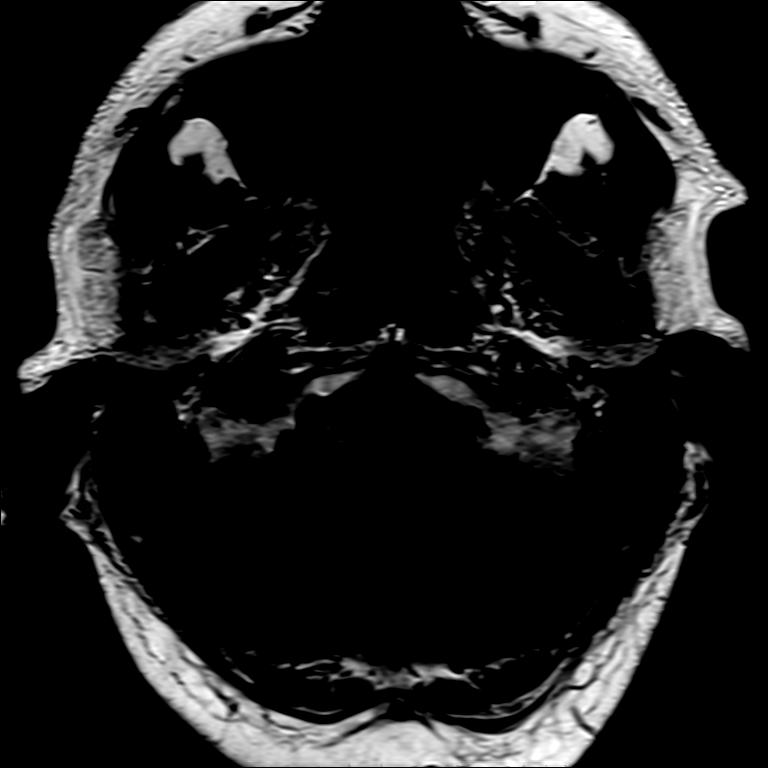

[Series 17: T1 post-contrast · axial · 3.0mm · 0.21mm/px · 1 of 15 slices shown (1 of 3)]
[im 1/15]
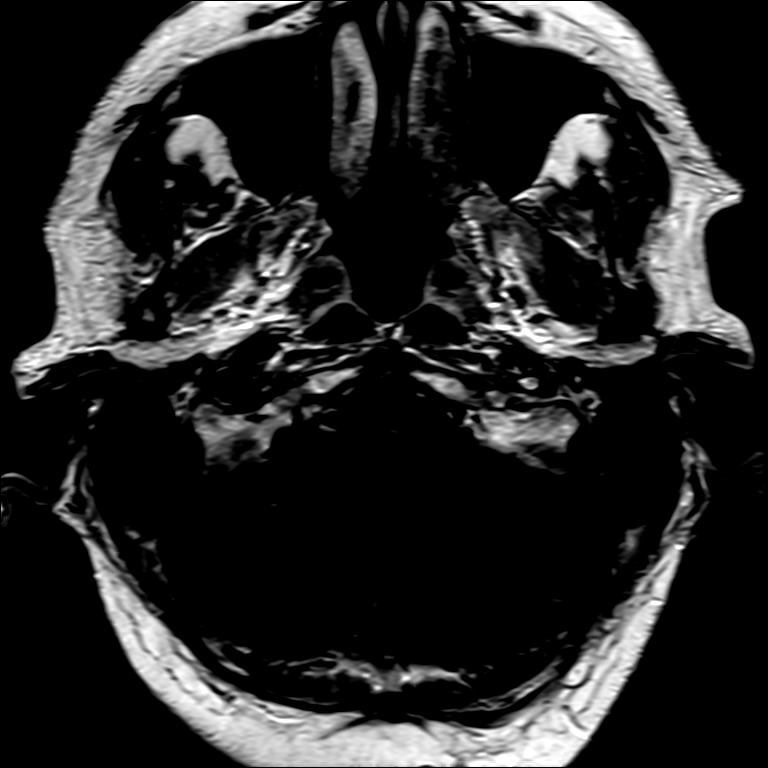

[Series 18: T1 post-contrast · coronal · 3.0mm · 0.21mm/px · 1 of 13 slices shown (2 of 3)]
[im 1/13]
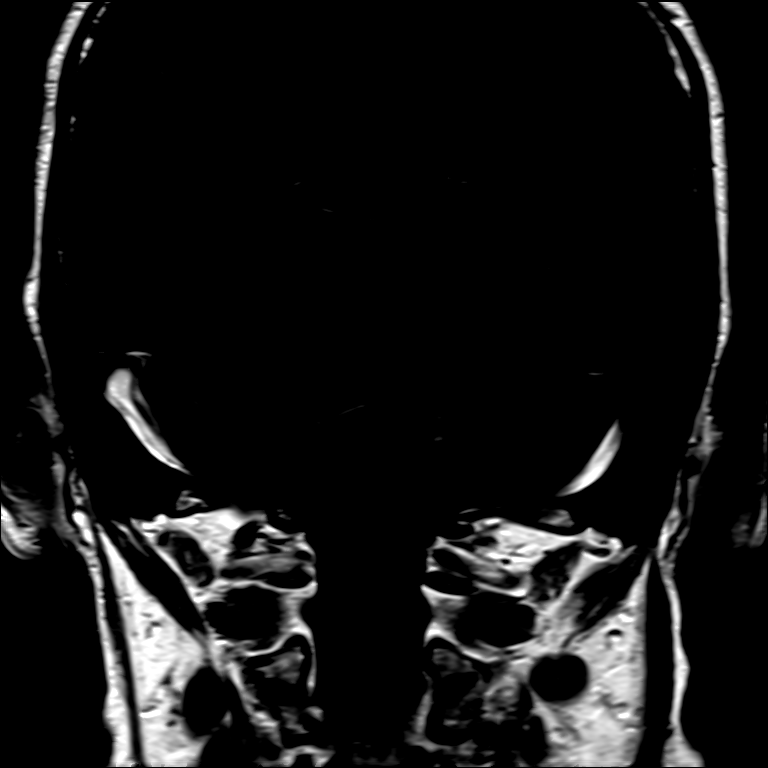

[Series 19: T1 post-contrast · axial · 1.0mm · 0.98mm/px · z∈[-72,+100]mm · 9 of 176 slices shown (3 of 3)]
[im 1/176]
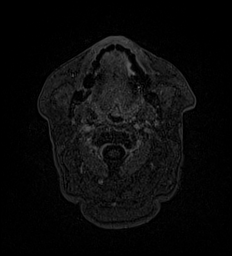
[im 30/176]
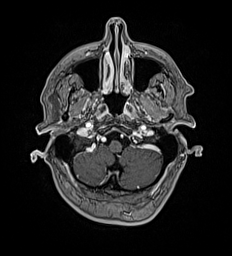
[im 59/176]
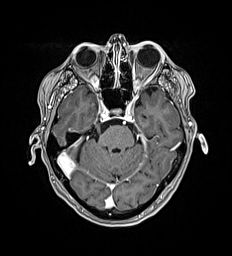
[im 73/176]
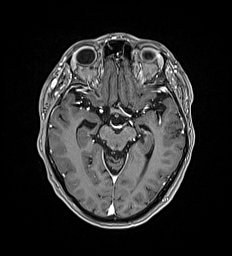
[im 88/176]
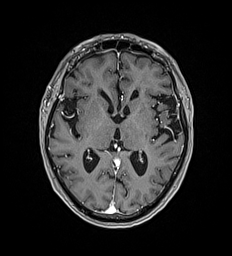
[im 103/176]
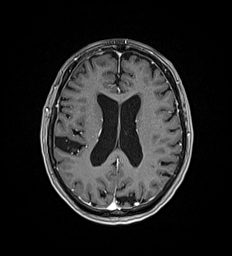
[im 117/176]
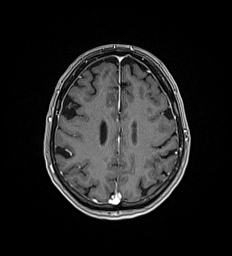
[im 146/176]
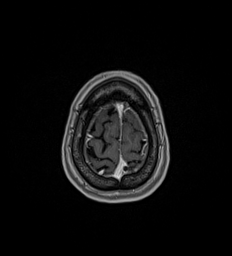
[im 176/176]
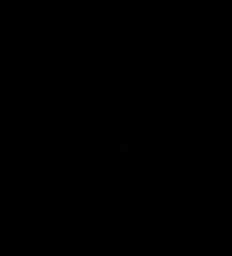

[26 of 48 positions shown; findings below may reference images not displayed]

FINDINGS: Brain:

There is mild generalized cerebral atrophy with associated
prominence of ventricles and sulci.

Minimal multifocal T2/FLAIR hyperintensity within the cerebral white
matter is nonspecific, but consistent with chronic small vessel
ischemic disease.

There is no evidence of intracranial mass. Specifically, no
cerebellopontine angle mass or internal auditory canal lesion is
demonstrated. Unremarkable appearance of the seventh and eighth
cranial nerves bilaterally.

There is no acute infarct.

No chronic intracranial blood products.

No extra-axial fluid collection.

No midline shift.

Partially empty sella turcica.

Vascular: Expected proximal arterial flow voids.

Skull and upper cervical spine: No focal marrow lesion.

Sinuses/Orbits: Visualized orbits show no acute finding. No
significant paranasal sinus disease or mastoid effusion at the
imaged levels.
IMPRESSION: No evidence of acute intracranial abnormality.

No cerebellopontine angle mass or internal auditory canal lesion is
demonstrated.

Minimal chronic small vessel ischemic changes within the cerebral
white matter.

Mild generalized cerebral atrophy.

## 2021-10-07 DIAGNOSIS — Z23 Encounter for immunization: Secondary | ICD-10-CM | POA: Diagnosis not present

## 2021-10-16 ENCOUNTER — Other Ambulatory Visit: Payer: Self-pay | Admitting: Internal Medicine

## 2021-10-17 NOTE — Telephone Encounter (Signed)
Requested Prescriptions  Pending Prescriptions Disp Refills  . lisinopril-hydrochlorothiazide (ZESTORETIC) 20-12.5 MG tablet [Pharmacy Med Name: LISINOP/HCTZ TAB 20-12.5] 90 tablet 0    Sig: TAKE 1 TABLET DAILY     Cardiovascular:  ACEI + Diuretic Combos Passed - 10/16/2021  8:09 AM      Passed - Na in normal range and within 180 days    Sodium  Date Value Ref Range Status  07/26/2021 141 134 - 144 mmol/L Final         Passed - K in normal range and within 180 days    Potassium  Date Value Ref Range Status  07/26/2021 3.9 3.5 - 5.2 mmol/L Final         Passed - Cr in normal range and within 180 days    Creatinine, Ser  Date Value Ref Range Status  07/26/2021 0.65 0.57 - 1.00 mg/dL Final         Passed - eGFR is 30 or above and within 180 days    GFR calc Af Amer  Date Value Ref Range Status  11/06/2019 92 >59 mL/min/1.73 Final    Comment:    **In accordance with recommendations from the NKF-ASN Task force,**   [facility] is in the process of updating its eGFR calculation to the   2021 CKD-EPI creatinine equation that estimates kidney function   without a race variable.    GFR calc non Af Amer  Date Value Ref Range Status  11/06/2019 80 >59 mL/min/1.73 Final   eGFR  Date Value Ref Range Status  07/26/2021 92 >59 mL/min/1.73 Final         Passed - Patient is not pregnant      Passed - Last BP in normal range    BP Readings from Last 1 Encounters:  07/26/21 123/78         Passed - Valid encounter within last 6 months    Recent Outpatient Visits          2 months ago Essential (primary) hypertension   [place] [facility] at [facility], Jesse Sans, MD   11 months ago Type II diabetes mellitus with complication [facility])   [place] [facility] at [facility], Jesse Sans, MD   1 year ago Essential (primary) hypertension   [place] [facility] at [facility], Jesse Sans, MD   1 year ago [place] [facility] at [facility], Jesse Sans, MD   1 year ago Essential (primary) hypertension   [place] [facility] at [facility], Jesse Sans, MD      Future Appointments            In 3 months Army Melia, Jesse Sans, MD [facility] Primary Care and Sports Medicine at [facility], [facility]   In 9 months Army Melia, Jesse Sans, MD [place] [facility] at [facility], [facility]

## 2021-11-04 DIAGNOSIS — E119 Type 2 diabetes mellitus without complications: Secondary | ICD-10-CM | POA: Diagnosis not present

## 2021-11-04 LAB — HM DIABETES EYE EXAM

## 2021-12-17 ENCOUNTER — Other Ambulatory Visit: Payer: Self-pay | Admitting: Internal Medicine

## 2021-12-19 NOTE — Telephone Encounter (Signed)
Requested medications are due for refill today.  unsure  Requested medications are on the active medications list.  yes  Last refill. 07/26/2021  Future visit scheduled.   yes  Notes to clinic.  Medication listed as historical.    Requested Prescriptions  Pending Prescriptions Disp Refills   metFORMIN (GLUCOPHAGE) 1000 MG tablet [Pharmacy Med Name: METFORMIN TAB 1000MG] 180 tablet 1    Sig: TAKE 1 TABLET TWICE A DAY     Endocrinology:  Diabetes - Biguanides Failed - 12/17/2021  8:34 PM      Failed - HBA1C is between 0 and 7.9 and within 180 days    Hemoglobin A1C  Date Value Ref Range Status  05/31/2021 7.2  Final         Failed - B12 Level in normal range and within 720 days    No results found for: "VITAMINB12"       Passed - Cr in normal range and within 360 days    Creatinine, Ser  Date Value Ref Range Status  07/26/2021 0.65 0.57 - 1.00 mg/dL Final         Passed - eGFR in normal range and within 360 days    GFR calc Af Amer  Date Value Ref Range Status  11/06/2019 92 >59 mL/min/1.73 Final    Comment:    **In accordance with recommendations from the NKF-ASN Task force,**   [facility] is in the process of updating its eGFR calculation to the   2021 CKD-EPI creatinine equation that estimates kidney function   without a race variable.    GFR calc non Af Amer  Date Value Ref Range Status  11/06/2019 80 >59 mL/min/1.73 Final   eGFR  Date Value Ref Range Status  07/26/2021 92 >59 mL/min/1.73 Final         Passed - Valid encounter within last 6 months    Recent Outpatient Visits           4 months ago Essential (primary) hypertension   [place] [facility] at [facility], Jesse Sans, MD   1 year ago Type II diabetes mellitus with complication [facility])   [place] [facility] at [facility], Jesse Sans, MD   1 year ago Essential (primary) hypertension   [place] [facility] Medicine  at [facility], Jesse Sans, MD   1 year ago [place] [facility] at [facility], Jesse Sans, MD   1 year ago Essential (primary) hypertension   [place] [facility] at [facility], Jesse Sans, MD       Future Appointments             In 1 month Army Melia Jesse Sans, MD [facility] Primary Care and Sports Medicine at [facility], [facility]   In 7 months Glean Hess, MD [facility] Primary Care and Sports Medicine at [facility], Outlook within normal limits and completed in the last 12 months    WBC  Date Value Ref Range Status  07/26/2021 5.3 3.4 - 10.8 x10E3/uL Final   RBC  Date Value Ref Range Status  07/26/2021 4.79 3.77 - 5.28 x10E6/uL Final   Hemoglobin  Date Value Ref Range Status  07/26/2021 14.1 11.1 - 15.9 g/dL Final   Hematocrit  Date Value Ref Range Status  07/26/2021 42.7 34.0 - 46.6 % Final   MCHC  Date Value Ref Range Status  07/26/2021 33.0 31.5 - 35.7 g/dL Final   [facility]  Date Value Ref Range Status  07/26/2021 29.4 26.6 - 33.0 pg Final   MCV  Date Value Ref Range Status  07/26/2021 89 79 - 97 fL Final   No results found for: "PLTCOUNTKUC", "LABPLAT", "POCPLA" RDW  Date Value Ref Range Status  07/26/2021 13.1 11.7 - 15.4 % Final

## 2021-12-21 ENCOUNTER — Ambulatory Visit: Payer: Self-pay

## 2021-12-21 NOTE — Telephone Encounter (Signed)
  Chief Complaint: cough - dry cough Symptoms: wheezing, mid chest pain with cough Frequency: yesterday Pertinent Negatives: Patient denies SOB Disposition: '[]'$ ED /'[]'$ Urgent Care (no appt availability in office) / '[]'$ Appointment(In office/virtual)/ '[]'$  [place] [facility]/ '[]'$ Home Care/ '[x]'$ Refused Recommended Disposition /'[]'$ [place] [facility]/ '[]'$  Follow-up with PCP Additional Notes: called office (spoke with Angela)to see if any appts today or if can be worked in. Unable to see pt today. Advised pt to go to U/C again and pt refused. Reason for Disposition  [1] MODERATE difficulty breathing (e.g., speaks in phrases, SOB even at rest, pulse 100-120) AND [2] still present when not coughing  Answer Assessment - Initial Assessment Questions 1. ONSET: "When did the cough begin?"      Yesterday 2. SEVERITY: "How bad is the cough today?"      Couldn't sleep 3. SPUTUM: "Describe the color of your sputum" (none, dry cough; clear, white, yellow, green)     none 4. HEMOPTYSIS: "Are you coughing up any blood?" If so ask: "How much?" (flecks, streaks, tablespoons, etc.)     no 5. DIFFICULTY BREATHING: "Are you having difficulty breathing?" If Yes, ask: "How bad is it?" (e.g., mild, moderate, severe)    - MILD: No SOB at rest, mild SOB with walking, speaks normally in sentences, can lie down, no retractions, pulse < 100.    - MODERATE: SOB at rest, SOB with minimal exertion and prefers to sit, cannot lie down flat, speaks in phrases, mild retractions, audible wheezing, pulse 100-120.    - SEVERE: Very SOB at rest, speaks in single words, struggling to breathe, sitting hunched forward, retractions, pulse > 120      moderate 6. FEVER: "Do you have a fever?" If Yes, ask: "What is your temperature, how was it measured, and when did it start?"     no 7. CARDIAC HISTORY: "Do you have any history of heart disease?" (e.g., heart attack, congestive heart failure)      N/a 8. LUNG HISTORY: "Do you have any  history of lung disease?"  (e.g., pulmonary embolus, asthma, emphysema)     N/a 9. PE RISK FACTORS: "Do you have a history of blood clots?" (or: recent major surgery, recent prolonged travel, bedridden)     no 10. OTHER SYMPTOMS: "Do you have any other symptoms?" (e.g., runny nose, wheezing, chest pain)       Wheezing, pain mid chest pain with cough 11. PREGNANCY: "Is there any chance you are pregnant?" "When was your last menstrual period?"       N/a 12. TRAVEL: "Have you traveled out of the country in the last month?" (e.g., travel history, exposures)       N/a  Protocols used: Cough - Acute Non-Productive-A-AH

## 2021-12-22 ENCOUNTER — Ambulatory Visit (INDEPENDENT_AMBULATORY_CARE_PROVIDER_SITE_OTHER): Payer: Medicare Other | Admitting: Internal Medicine

## 2021-12-22 ENCOUNTER — Encounter: Payer: Self-pay | Admitting: Internal Medicine

## 2021-12-22 VITALS — BP 122/64 | HR 112 | Temp 100.3°F | Ht 63.0 in | Wt 221.0 lb

## 2021-12-22 DIAGNOSIS — J101 Influenza due to other identified influenza virus with other respiratory manifestations: Secondary | ICD-10-CM | POA: Diagnosis not present

## 2021-12-22 LAB — POC COVID19 BINAXNOW: SARS Coronavirus 2 Ag: NEGATIVE

## 2021-12-22 LAB — POCT INFLUENZA A/B
Influenza A, POC: POSITIVE — AB
Influenza B, POC: NEGATIVE

## 2021-12-22 MED ORDER — PROMETHAZINE-DM 6.25-15 MG/5ML PO SYRP: 5.0000 mL | ORAL_SOLUTION | Freq: Four times a day (QID) | ORAL | 0 refills | Status: AC | PRN

## 2021-12-22 MED ORDER — OSELTAMIVIR PHOSPHATE 75 MG PO CAPS: 75.0000 mg | ORAL_CAPSULE | Freq: Two times a day (BID) | ORAL | 0 refills | Status: AC

## 2021-12-22 NOTE — Progress Notes (Signed)
Date:  12/22/2021   Name:  Nancy Chen   DOB:  [birth year]   MRN:  [id number]   Chief Complaint: Cough  Cough This is a new problem. Episode onset: 2 days ago. The problem has been gradually worsening. The cough is Non-productive. Associated symptoms include chills, a fever, headaches, myalgias, a sore throat, shortness of breath and wheezing. Pertinent negatives include no chest pain. Treatments tried: zyrtec and flonase. The treatment provided mild relief.    Lab Results  Component Value Date   NA 141 07/26/2021   K 3.9 07/26/2021   CO2 25 07/26/2021   GLUCOSE 127 (H) 07/26/2021   BUN 19 07/26/2021   CREATININE 0.65 07/26/2021   CALCIUM 10.3 07/26/2021   EGFR 92 07/26/2021   GFRNONAA 80 11/06/2019   Lab Results  Component Value Date   CHOL 163 07/26/2021   HDL 54 07/26/2021   LDLCALC 80 07/26/2021   TRIG 172 (H) 07/26/2021   CHOLHDL 3.0 07/26/2021   Lab Results  Component Value Date   TSH 1.110 07/26/2021   Lab Results  Component Value Date   HGBA1C 7.5 09/20/2021   Lab Results  Component Value Date   WBC 5.3 07/26/2021   HGB 14.1 07/26/2021   HCT 42.7 07/26/2021   MCV 89 07/26/2021   PLT 237 07/26/2021   Lab Results  Component Value Date   ALT 26 07/26/2021   AST 25 07/26/2021   ALKPHOS 63 07/26/2021   BILITOT 0.5 07/26/2021   No results found for: "25OHVITD2", "25OHVITD3", "VD25OH"   Review of Systems  Constitutional:  Positive for chills, fatigue and fever.  HENT:  Positive for congestion and sore throat.   Respiratory:  Positive for cough, shortness of breath and wheezing.   Cardiovascular:  Negative for chest pain and leg swelling.  Gastrointestinal:  Negative for abdominal pain, constipation, diarrhea and vomiting.  Musculoskeletal:  Positive for myalgias.  Neurological:  Positive for headaches.  Psychiatric/Behavioral:  Negative for dysphoric mood and sleep disturbance. The patient is not nervous/anxious.     Patient Active Problem  List   Diagnosis Date Noted   Aortic atherosclerosis ([place]) 07/15/2019   BMI 40.0-44.9, adult ([place]) 03/12/2019   Facial basal cell cancer 10/31/2016   Migraine 10/31/2016   Lipoma of left upper extremity 10/11/2015   Type II diabetes mellitus with complication ([place]) 63/84/5364   Ovarian failure 11/10/2014   Essential (primary) hypertension 07/15/2014   Hyperlipidemia associated with type 2 diabetes mellitus ([place]) 07/15/2014   Tricompartment osteoarthritis of knee 07/15/2014    Allergies  Allergen Reactions   Glipizide Swelling    hives   Codeine Hives    Past Surgical History:  Procedure Laterality Date   ABDOMINAL HYSTERECTOMY     BASAL CELL CARCINOMA EXCISION     CARPAL TUNNEL RELEASE Bilateral    CARPAL TUNNEL RELEASE     CATARACT EXTRACTION W/PHACO Left 09/07/2016   Procedure: CATARACT EXTRACTION PHACO AND INTRAOCULAR LENS PLACEMENT ([place]);  Surgeon: Eulogio Bear, MD;  Location: [facility] ORS;  Service: Ophthalmology;  Laterality: Left;  [place] 00:41.9 AP% 14.9 CDE 6.23 Fluid pack lot # [id number]   CATARACT EXTRACTION W/PHACO Right 10/19/2016   Procedure: CATARACT EXTRACTION PHACO AND INTRAOCULAR LENS PLACEMENT ([facility]);  Surgeon: Eulogio Bear, MD;  Location: [facility] ORS;  Service: Ophthalmology;  Laterality: Right;  casette lot # [id number] [place]  00:59.5 AP%   16.5 CDE   9.79    COLONOSCOPY  06/2012   normal (in [place])  EYE SURGERY     TONSILLECTOMY     TUBAL LIGATION      Social History   Tobacco Use   Smoking status: Never   Smokeless tobacco: Never   Tobacco comments:    smoking cessation materials not required  Vaping Use   Vaping Use: Never used  Substance Use Topics   Alcohol use: Not Currently    Alcohol/week: 1.0 standard drink of alcohol    Types: 1 Glasses of wine per week    Comment: once monthly maybe   Drug use: No     Medication list has been reviewed and updated.  Current Meds  Medication Sig   Acetaminophen (TYLENOL ARTHRITIS PAIN PO) Take 1  capsule by mouth 2 (two) times daily.   aspirin 81 MG chewable tablet Chew by mouth daily.   Bioflavonoids (BIOFLAVONOID-1000) 1000 MG TABS Take by mouth.   cetirizine (ZYRTEC) 10 MG tablet Take 10 mg by mouth as needed.    Dulaglutide (TRULICITY) 3 ZO/1.0RU SOPN Inject into the skin once a week.   glucose blood test strip    lisinopril-hydrochlorothiazide (ZESTORETIC) 20-12.5 MG tablet TAKE 1 TABLET DAILY   metFORMIN (GLUCOPHAGE) 1000 MG tablet TAKE 1 TABLET TWICE A DAY   Multiple Vitamins-Minerals (PRESERVISION AREDS PO) Take by mouth.   naproxen sodium (ANAPROX) 220 MG tablet Take 220 mg by mouth 2 (two) times daily with a meal. PRN   Pyridoxine HCl (VITAMIN B6) 200 MG TABS Take 100 mg by mouth 2 (two) times daily.   simvastatin (ZOCOR) 20 MG tablet TAKE 1 TABLET DAILY   Turmeric (QC TUMERIC COMPLEX PO) Take by mouth.       12/22/2021    8:58 AM 07/26/2021   10:50 AM 11/16/2020   10:05 AM 07/16/2020   10:06 AM  GAD 7 : Generalized Anxiety Score  Nervous, Anxious, on Edge 0 0 0 0  Control/stop worrying 0 0 0 0  Worry too much - different things 0 0 0 0  Trouble relaxing 0 0 0 0  Restless 0 0 0 0  Easily annoyed or irritable 0 0 0 0  Afraid - awful might happen 0 0 0 0  Total GAD 7 Score 0 0 0 0  Anxiety Difficulty Not difficult at all Not difficult at all Not difficult at all        12/22/2021    8:58 AM 07/26/2021   10:50 AM 07/11/2021   10:00 AM  Depression screen PHQ 2/9  Decreased Interest 0 0 0  Down, Depressed, Hopeless 0 0 0  PHQ - 2 Score 0 0 0  Altered sleeping 0 1   Tired, decreased energy 0 1   Change in appetite 0 0   Feeling bad or failure about yourself  0 0   Trouble concentrating 0 0   Moving slowly or fidgety/restless 0 0   Suicidal thoughts 0 0   PHQ-9 Score 0 2   Difficult doing work/chores Not difficult at all Not difficult at all     BP Readings from Last 3 Encounters:  12/22/21 122/64  07/26/21 123/78  11/16/20 108/74    Physical  Exam Constitutional:      Appearance: She is ill-appearing.  Cardiovascular:     Rate and Rhythm: Normal rate and regular rhythm.     Heart sounds: No murmur heard. Pulmonary:     Effort: Pulmonary effort is normal.     Breath sounds: No wheezing or rhonchi.  Musculoskeletal:  Cervical back: Normal range of motion.  Lymphadenopathy:     Cervical: No cervical adenopathy.  Skin:    General: Skin is warm and dry.  Neurological:     General: No focal deficit present.     Mental Status: She is alert.     Wt Readings from Last 3 Encounters:  12/22/21 221 lb (100.2 kg)  07/26/21 221 lb (100.2 kg)  07/11/21 218 lb (98.9 kg)    BP 122/64   Pulse (!) 112   Temp 100.3 F (37.9 C) (Oral)   Ht _0  (1.6 m)   Wt 221 lb (100.2 kg)   SpO2 98%   BMI 39.15 kg/m   Assessment and Plan: 1. Influenza A H1N1 infection Recommend fluids and rest; tylenol every 6 hours for fever/myalgia/headache - POCT Influenza A/B  - +A - POC COVID-19 BinaxNow - negative - promethazine-dextromethorphan (PROMETHAZINE-DM) 6.25-15 MG/5ML syrup; Take 5 mLs by mouth 4 (four) times daily as needed for up to 9 days for cough.  Dispense: 180 mL; Refill: 0 - oseltamivir (TAMIFLU) 75 MG capsule; Take 1 capsule (75 mg total) by mouth 2 (two) times daily for 5 days.  Dispense: 10 capsule; Refill: 0   Partially dictated using Editor, commissioning. Any errors are unintentional.  Halina Maidens, MD [facility]  12/22/2021

## 2021-12-22 NOTE — Patient Instructions (Signed)
Take Tylenol every 6 hours for fever and headache

## 2022-01-14 ENCOUNTER — Other Ambulatory Visit: Payer: Self-pay | Admitting: Internal Medicine

## 2022-01-16 NOTE — Telephone Encounter (Signed)
Requested Prescriptions  Pending Prescriptions Disp Refills   lisinopril-hydrochlorothiazide (ZESTORETIC) 20-12.5 MG tablet [Pharmacy Med Name: LISINOP/HCTZ TAB 20-12.5] 90 tablet 0    Sig: TAKE 1 TABLET DAILY     Cardiovascular:  ACEI + Diuretic Combos Passed - 01/14/2022  2:19 AM      Passed - Na in normal range and within 180 days    Sodium  Date Value Ref Range Status  07/26/2021 141 134 - 144 mmol/L Final         Passed - K in normal range and within 180 days    Potassium  Date Value Ref Range Status  07/26/2021 3.9 3.5 - 5.2 mmol/L Final         Passed - Cr in normal range and within 180 days    Creatinine, Ser  Date Value Ref Range Status  07/26/2021 0.65 0.57 - 1.00 mg/dL Final         Passed - eGFR is 30 or above and within 180 days    GFR calc Af Amer  Date Value Ref Range Status  11/06/2019 92 >59 mL/min/1.73 Final    Comment:    **In accordance with recommendations from the NKF-ASN Task force,**   [facility] is in the process of updating its eGFR calculation to the   2021 CKD-EPI creatinine equation that estimates kidney function   without a race variable.    GFR calc non Af Amer  Date Value Ref Range Status  11/06/2019 80 >59 mL/min/1.73 Final   eGFR  Date Value Ref Range Status  07/26/2021 92 >59 mL/min/1.73 Final         Passed - Patient is not pregnant      Passed - Last BP in normal range    BP Readings from Last 1 Encounters:  12/22/21 122/64         Passed - Valid encounter within last 6 months    Recent Outpatient Visits           3 weeks ago Influenza A H1N1 infection   [place] [facility], Jesse Sans, MD   5 months ago Essential (primary) hypertension   [place] [facility] at [facility], Jesse Sans, MD   1 year ago Type II diabetes mellitus with complication [facility])   [place] [facility] at [facility], Jesse Sans, MD    1 year ago Essential (primary) hypertension   [place] [facility] at [facility], Jesse Sans, MD   1 year ago [place] and Sports Medicine at [facility], Jesse Sans, MD       Future Appointments             In 1 week Army Melia Jesse Sans, MD [place] [facility] at [facility], [facility]   In 6 months Army Melia, Jesse Sans, MD [facility] Primary Care and Sports Medicine at [facility], [place]             simvastatin (ZOCOR) 20 MG tablet [Pharmacy Med Name: SIMVASTATIN  TAB '20MG'$ ] 90 tablet 0    Sig: TAKE 1 TABLET DAILY     Cardiovascular:  Antilipid - Statins Failed - 01/14/2022  2:19 AM      Failed - Lipid Panel in normal range within the last 12 months    Cholesterol, Total  Date Value Ref Range Status  07/26/2021 163 100 - 199 mg/dL Final   LDL Chol Calc (NIH)  Date Value Ref Range Status  07/26/2021 80 0 - 99 mg/dL Final   HDL  Date Value Ref Range Status  07/26/2021 54 >39 mg/dL Final   Triglycerides  Date Value Ref Range Status  07/26/2021 172 (H) 0 - 149 mg/dL Final         Passed - Patient is not pregnant      Passed - Valid encounter within last 12 months    Recent Outpatient Visits           3 weeks ago Influenza A H1N1 infection   [place] [facility] at [facility], Jesse Sans, MD   5 months ago Essential (primary) hypertension   [place] [facility] at [facility], Jesse Sans, MD   1 year ago Type II diabetes mellitus with complication [facility])   [place] [facility] Medicine at [facility], Jesse Sans, MD   1 year ago Essential (primary) hypertension   [place] [facility] Medicine at [facility], Jesse Sans, MD   1 year ago [place] [facility] Sports Medicine at [facility], Jesse Sans, MD        Future Appointments             In 1 week Army Melia, Jesse Sans, MD [facility] Primary Care and Sports Medicine at [facility], [facility]   In 6 months Army Melia, Jesse Sans, MD [place] [facility] and Sports Medicine at [facility], [facility]

## 2022-01-18 DIAGNOSIS — E1129 Type 2 diabetes mellitus with other diabetic kidney complication: Secondary | ICD-10-CM | POA: Diagnosis not present

## 2022-01-18 DIAGNOSIS — E669 Obesity, unspecified: Secondary | ICD-10-CM | POA: Diagnosis not present

## 2022-01-18 DIAGNOSIS — E1169 Type 2 diabetes mellitus with other specified complication: Secondary | ICD-10-CM | POA: Diagnosis not present

## 2022-01-18 DIAGNOSIS — R809 Proteinuria, unspecified: Secondary | ICD-10-CM | POA: Diagnosis not present

## 2022-01-18 LAB — LIPID PANEL
Cholesterol: 139 (ref 0–200)
HDL: 46 (ref 35–70)
LDL Cholesterol: 48
Triglycerides: 221 — AB (ref 40–160)

## 2022-01-18 LAB — PROTEIN / CREATININE RATIO, URINE
Albumin, U: 104
Creatinine, Urine: 239

## 2022-01-18 LAB — MICROALBUMIN / CREATININE URINE RATIO: Microalb Creat Ratio: 44

## 2022-01-24 DIAGNOSIS — E1129 Type 2 diabetes mellitus with other diabetic kidney complication: Secondary | ICD-10-CM | POA: Diagnosis not present

## 2022-01-24 DIAGNOSIS — E669 Obesity, unspecified: Secondary | ICD-10-CM | POA: Diagnosis not present

## 2022-01-24 DIAGNOSIS — E1169 Type 2 diabetes mellitus with other specified complication: Secondary | ICD-10-CM | POA: Diagnosis not present

## 2022-01-24 DIAGNOSIS — R809 Proteinuria, unspecified: Secondary | ICD-10-CM | POA: Diagnosis not present

## 2022-01-24 LAB — HEMOGLOBIN A1C: Hemoglobin A1C: 7.3

## 2022-01-26 ENCOUNTER — Ambulatory Visit: Payer: Medicare Other | Admitting: Internal Medicine

## 2022-02-06 DIAGNOSIS — Z86018 Personal history of other benign neoplasm: Secondary | ICD-10-CM | POA: Diagnosis not present

## 2022-02-06 DIAGNOSIS — Z872 Personal history of diseases of the skin and subcutaneous tissue: Secondary | ICD-10-CM | POA: Diagnosis not present

## 2022-02-06 DIAGNOSIS — Z859 Personal history of malignant neoplasm, unspecified: Secondary | ICD-10-CM | POA: Diagnosis not present

## 2022-02-06 DIAGNOSIS — L570 Actinic keratosis: Secondary | ICD-10-CM | POA: Diagnosis not present

## 2022-02-06 DIAGNOSIS — D485 Neoplasm of uncertain behavior of skin: Secondary | ICD-10-CM | POA: Diagnosis not present

## 2022-02-06 DIAGNOSIS — L578 Other skin changes due to chronic exposure to nonionizing radiation: Secondary | ICD-10-CM | POA: Diagnosis not present

## 2022-02-06 DIAGNOSIS — Z85828 Personal history of other malignant neoplasm of skin: Secondary | ICD-10-CM | POA: Diagnosis not present

## 2022-02-15 ENCOUNTER — Encounter: Payer: Self-pay | Admitting: Internal Medicine

## 2022-02-15 ENCOUNTER — Ambulatory Visit (INDEPENDENT_AMBULATORY_CARE_PROVIDER_SITE_OTHER): Payer: Medicare Other | Admitting: Internal Medicine

## 2022-02-15 VITALS — BP 118/72 | HR 96 | Ht 63.0 in | Wt 218.8 lb

## 2022-02-15 DIAGNOSIS — E1169 Type 2 diabetes mellitus with other specified complication: Secondary | ICD-10-CM

## 2022-02-15 DIAGNOSIS — I700 Atherosclerosis of aorta: Secondary | ICD-10-CM | POA: Diagnosis not present

## 2022-02-15 DIAGNOSIS — R002 Palpitations: Secondary | ICD-10-CM

## 2022-02-15 DIAGNOSIS — E785 Hyperlipidemia, unspecified: Secondary | ICD-10-CM

## 2022-02-15 DIAGNOSIS — I10 Essential (primary) hypertension: Secondary | ICD-10-CM | POA: Diagnosis not present

## 2022-02-15 DIAGNOSIS — Z6841 Body Mass Index (BMI) 40.0 and over, adult: Secondary | ICD-10-CM

## 2022-02-15 DIAGNOSIS — E118 Type 2 diabetes mellitus with unspecified complications: Secondary | ICD-10-CM | POA: Diagnosis not present

## 2022-02-15 NOTE — Assessment & Plan Note (Signed)
Continue Trulicity to help with weight Work harder on diet changes

## 2022-02-15 NOTE — Assessment & Plan Note (Signed)
Clinically stable exam with well controlled BP on lisinopril hct. Tolerating medications without side effects. Pt to continue current regimen and low sodium diet.  

## 2022-02-15 NOTE — Assessment & Plan Note (Addendum)
Tolerating statin medication without side effects at this time LDL is  Lab Results  Component Value Date   LDLCALC 48 01/18/2022   with a goal of < 70. Current dose is appropriate but will be adjusted if needed.

## 2022-02-15 NOTE — Assessment & Plan Note (Signed)
On appropriate aspirin and statin

## 2022-02-15 NOTE — Assessment & Plan Note (Signed)
EKG today shows one EVB Will refer to Cardiology

## 2022-02-15 NOTE — Progress Notes (Signed)
Date:  02/15/2022   Name:  Nancy Chen   DOB:  [birth year]   MRN:  [id number]   Chief Complaint: Hypertension  Hypertension This is a chronic problem. The problem is controlled. Associated symptoms include palpitations. Pertinent negatives include no chest pain, headaches or shortness of breath. Past treatments include ACE inhibitors and diuretics. The current treatment provides significant improvement.  Hyperlipidemia This is a chronic problem. The problem is controlled. Pertinent negatives include no chest pain or shortness of breath. Current antihyperlipidemic treatment includes statins.  Palpitations  This is a recurrent problem. The problem has been unchanged. On average, each episode lasts 5 seconds. Pertinent negatives include no chest pain, coughing, fever, numbness or shortness of breath.    Lab Results  Component Value Date   NA 141 07/26/2021   K 3.9 07/26/2021   CO2 25 07/26/2021   GLUCOSE 127 (H) 07/26/2021   BUN 19 07/26/2021   CREATININE 0.65 07/26/2021   CALCIUM 10.3 07/26/2021   EGFR 92 07/26/2021   GFRNONAA 80 11/06/2019   Lab Results  Component Value Date   CHOL 139 01/18/2022   HDL 46 01/18/2022   LDLCALC 48 01/18/2022   TRIG 221 (A) 01/18/2022   CHOLHDL 3.0 07/26/2021   Lab Results  Component Value Date   TSH 1.110 07/26/2021   Lab Results  Component Value Date   HGBA1C 7.3 01/24/2022   Lab Results  Component Value Date   WBC 5.3 07/26/2021   HGB 14.1 07/26/2021   HCT 42.7 07/26/2021   MCV 89 07/26/2021   PLT 237 07/26/2021   Lab Results  Component Value Date   ALT 26 07/26/2021   AST 25 07/26/2021   ALKPHOS 63 07/26/2021   BILITOT 0.5 07/26/2021   No results found for: "25OHVITD2", "25OHVITD3", "VD25OH"   Review of Systems  Constitutional:  Negative for appetite change, fatigue, fever and unexpected weight change.  HENT:  Negative for tinnitus and trouble swallowing.   Eyes:  Negative for visual disturbance.  Respiratory:   Negative for cough, chest tightness and shortness of breath.   Cardiovascular:  Positive for palpitations. Negative for chest pain and leg swelling.  Gastrointestinal:  Negative for abdominal pain.  Endocrine: Negative for polydipsia and polyuria.  Genitourinary:  Negative for dysuria and hematuria.  Musculoskeletal:  Negative for arthralgias.  Neurological:  Negative for tremors, numbness and headaches.  Psychiatric/Behavioral:  Negative for dysphoric mood.     Patient Active Problem List   Diagnosis Date Noted   Heart palpitations 02/15/2022   Aortic atherosclerosis ([place]) 07/15/2019   BMI 40.0-44.9, adult ([place]) 03/12/2019   Facial basal cell cancer 10/31/2016   Migraine 10/31/2016   Lipoma of left upper extremity 10/11/2015   Type II diabetes mellitus with complication ([place]) 62/83/1517   Ovarian failure 11/10/2014   Essential (primary) hypertension 07/15/2014   Hyperlipidemia associated with type 2 diabetes mellitus ([place]) 07/15/2014   Tricompartment osteoarthritis of knee 07/15/2014    Allergies  Allergen Reactions   Glipizide Swelling    hives   Codeine Hives    Past Surgical History:  Procedure Laterality Date   ABDOMINAL HYSTERECTOMY     BASAL CELL CARCINOMA EXCISION     CARPAL TUNNEL RELEASE Bilateral    CARPAL TUNNEL RELEASE     CATARACT EXTRACTION W/PHACO Left 09/07/2016   Procedure: CATARACT EXTRACTION PHACO AND INTRAOCULAR LENS PLACEMENT ([place]);  Surgeon: Eulogio Bear, MD;  Location: [facility] ORS;  Service: Ophthalmology;  Laterality: Left;  [place] 00:41.9 AP%  14.9 CDE 6.23 Fluid pack lot # [id number]   CATARACT EXTRACTION W/PHACO Right 10/19/2016   Procedure: CATARACT EXTRACTION PHACO AND INTRAOCULAR LENS PLACEMENT (IOC);  Surgeon: Eulogio Bear, MD;  Location: [facility] ORS;  Service: Ophthalmology;  Laterality: Right;  casette lot # [id number] H [place]  00:59.5 AP%   16.5 CDE   9.79    COLONOSCOPY  06/2012   normal (in [place])   EYE SURGERY     TONSILLECTOMY     TUBAL  LIGATION      Social History   Tobacco Use   Smoking status: Never   Smokeless tobacco: Never   Tobacco comments:    smoking cessation materials not required  Vaping Use   Vaping Use: Never used  Substance Use Topics   Alcohol use: Not Currently    Alcohol/week: 1.0 standard drink of alcohol    Types: 1 Glasses of wine per week    Comment: once monthly maybe   Drug use: No     Medication list has been reviewed and updated.  Current Meds  Medication Sig   Acetaminophen (TYLENOL ARTHRITIS PAIN PO) Take 1 capsule by mouth 2 (two) times daily.   aspirin 81 MG chewable tablet Chew by mouth daily.   Bioflavonoids (BIOFLAVONOID-1000) 1000 MG TABS Take by mouth.   cetirizine (ZYRTEC) 10 MG tablet Take 10 mg by mouth as needed.    Dulaglutide 4.5 MG/0.5ML SOPN Inject into the skin once a week. SOLUM   glucose blood test strip    lisinopril-hydrochlorothiazide (ZESTORETIC) 20-12.5 MG tablet TAKE 1 TABLET DAILY   metFORMIN (GLUCOPHAGE) 1000 MG tablet TAKE 1 TABLET TWICE A DAY (Patient taking differently: Take 1,000 mg by mouth 2 (two) times daily. SOLUM)   Multiple Vitamins-Minerals (PRESERVISION AREDS PO) Take by mouth.   naproxen sodium (ANAPROX) 220 MG tablet Take 220 mg by mouth 2 (two) times daily with a meal. PRN   Pyridoxine HCl (VITAMIN B6) 200 MG TABS Take 100 mg by mouth 2 (two) times daily.   simvastatin (ZOCOR) 20 MG tablet TAKE 1 TABLET DAILY   Turmeric (QC TUMERIC COMPLEX PO) Take by mouth.       02/15/2022    2:54 PM 12/22/2021    8:58 AM 07/26/2021   10:50 AM 11/16/2020   10:05 AM  GAD 7 : Generalized Anxiety Score  Nervous, Anxious, on Edge 1 0 0 0  Control/stop worrying 1 0 0 0  Worry too much - different things 1 0 0 0  Trouble relaxing 0 0 0 0  Restless 0 0 0 0  Easily annoyed or irritable 0 0 0 0  Afraid - awful might happen 0 0 0 0  Total GAD 7 Score 3 0 0 0  Anxiety Difficulty Not difficult at all Not difficult at all Not difficult at all Not difficult  at all       02/15/2022    2:53 PM 12/22/2021    8:58 AM 07/26/2021   10:50 AM  Depression screen PHQ 2/9  Decreased Interest 0 0 0  Down, Depressed, Hopeless 0 0 0  PHQ - 2 Score 0 0 0  Altered sleeping 3 0 1  Tired, decreased energy 3 0 1  Change in appetite 0 0 0  Feeling bad or failure about yourself  0 0 0  Trouble concentrating 0 0 0  Moving slowly or fidgety/restless 0 0 0  Suicidal thoughts 0 0 0  PHQ-9 Score 6 0 2  Difficult doing work/chores Not difficult  at all Not difficult at all Not difficult at all    BP Readings from Last 3 Encounters:  02/15/22 118/72  12/22/21 122/64  07/26/21 123/78    Physical Exam Vitals and nursing note reviewed.  Constitutional:      General: She is not in acute distress.    Appearance: Normal appearance. She is well-developed.  HENT:     Head: Normocephalic and atraumatic.  Neck:     Vascular: No carotid bruit.  Cardiovascular:     Rate and Rhythm: Normal rate and regular rhythm.     Pulses: Normal pulses.     Heart sounds: No murmur heard. Pulmonary:     Effort: Pulmonary effort is normal. No respiratory distress.     Breath sounds: No wheezing or rhonchi.  Musculoskeletal:     Cervical back: Normal range of motion.     Right lower leg: No edema.     Left lower leg: No edema.  Lymphadenopathy:     Cervical: No cervical adenopathy.  Skin:    General: Skin is warm and dry.     Capillary Refill: Capillary refill takes less than 2 seconds.     Findings: No rash.  Neurological:     General: No focal deficit present.     Mental Status: She is alert and oriented to person, place, and time.  Psychiatric:        Mood and Affect: Mood normal.        Behavior: Behavior normal.     Wt Readings from Last 3 Encounters:  02/15/22 218 lb 12.8 oz (99.2 kg)  12/22/21 221 lb (100.2 kg)  07/26/21 221 lb (100.2 kg)    BP 118/72   Pulse 96   Ht '5\' 3"'$  (1.6 m)   Wt 218 lb 12.8 oz (99.2 kg)   SpO2 99%   BMI 38.76 kg/m    Assessment and Plan: Problem List Items Addressed This Visit       Cardiovascular and Mediastinum   Aortic atherosclerosis ([place]) (Chronic)    On appropriate aspirin and statin      Essential (primary) hypertension - Primary (Chronic)    Clinically stable exam with well controlled BP on lisinopril hct. Tolerating medications without side effects. Pt to continue current regimen and low sodium diet.         Endocrine   Hyperlipidemia associated with type 2 diabetes mellitus ([place]) (Chronic)    Tolerating statin medication without side effects at this time LDL is  Lab Results  Component Value Date   LDLCALC 48 01/18/2022  with a goal of < 70. Current dose is appropriate but will be adjusted if needed.       Type II diabetes mellitus with complication (HCC) (Chronic)    Followed by Endo Recent A1C 7.3 Medications continued with emphasis on diet changes        Other   BMI 40.0-44.9, adult ([place])    Continue Trulicity to help with weight Work harder on diet changes      Heart palpitations   Relevant Orders   Ambulatory referral to Cardiology   EKG 12-Lead (Completed)     Partially dictated using Editor, commissioning. Any errors are unintentional.  Halina Maidens, MD [facility]  02/15/2022

## 2022-02-15 NOTE — Assessment & Plan Note (Signed)
Followed by Endo Recent A1C 7.3 Medications continued with emphasis on diet changes

## 2022-02-16 NOTE — Telephone Encounter (Signed)
FYI

## 2022-02-16 NOTE — Telephone Encounter (Signed)
Pt response.  KP

## 2022-04-05 ENCOUNTER — Ambulatory Visit: Payer: Medicare Other

## 2022-04-05 ENCOUNTER — Encounter: Payer: Self-pay | Admitting: Cardiology

## 2022-04-05 ENCOUNTER — Ambulatory Visit: Payer: Medicare Other | Attending: Cardiology | Admitting: Cardiology

## 2022-04-05 VITALS — BP 126/82 | HR 87 | Ht 63.0 in | Wt 218.8 lb

## 2022-04-05 DIAGNOSIS — E7800 Pure hypercholesterolemia, unspecified: Secondary | ICD-10-CM | POA: Insufficient documentation

## 2022-04-05 DIAGNOSIS — R002 Palpitations: Secondary | ICD-10-CM

## 2022-04-05 DIAGNOSIS — R072 Precordial pain: Secondary | ICD-10-CM

## 2022-04-05 DIAGNOSIS — I10 Essential (primary) hypertension: Secondary | ICD-10-CM | POA: Insufficient documentation

## 2022-04-05 MED ORDER — IVABRADINE HCL 5 MG PO TABS: 10.0000 mg | ORAL_TABLET | Freq: Once | ORAL | 0 refills | Status: AC

## 2022-04-05 MED ORDER — METOPROLOL TARTRATE 100 MG PO TABS: 100.0000 mg | ORAL_TABLET | Freq: Once | ORAL | 0 refills | Status: DC

## 2022-04-05 NOTE — Progress Notes (Signed)
Cardiology Office Note:    Date:  04/05/2022   ID:  RIN NOVIELLO, DOB 1945/07/15, MRN [id number]  PCP:  Glean Hess, MD   [facility] Cardiologist:  Kate Sable, MD     Referring MD: Glean Hess, MD   Chief Complaint  Patient presents with   New Patient (Initial Visit)    Palpitations, Cardiac Hx, occasional sharp pain in chest     History of Present Illness:    Nancy Chen is a 77 y.o. female with a hx of hypertension, hyperlipidemia, diabetes who presents due to palpitations and chest pain.  Symptoms of palpitations have been ongoing over the past year.  Worsened over the past 2 months.  Symptoms last for few seconds, occurring at least 3-4 times weekly.  Denies dizziness or syncope.  Has a family history of MI with father having heart attack in his 44s, her son recently had coronary artery disease requiring stents, also in his 38s.  Patient has occasional nonexertional sharp pain occurring over the last month.  Moved to the area from [place], states having a stress test about 10 years ago, likely normal.  Was seeing cardiology in [place] due to significant stress from taking care of late husband.  Past Medical History:  Diagnosis Date   Allergy    Anemia    Came from menstrual issues   Anxiety    Arthritis    Bronchitis    Cataract 2018   COPD (chronic obstructive pulmonary disease) ([place])    Diabetes mellitus without complication ([place])    Facial basal cell cancer 10/31/2016   also left lower eyelid, chest and shoulder   Hypercholesteremia    Hypertension    Long-term use of high-risk medication 11/10/2014   Actos started 11/2014    Lower extremity edema     Past Surgical History:  Procedure Laterality Date   ABDOMINAL HYSTERECTOMY     BASAL CELL CARCINOMA EXCISION     CARPAL TUNNEL RELEASE Bilateral    CARPAL TUNNEL RELEASE     CATARACT EXTRACTION W/PHACO Left 09/07/2016   Procedure: CATARACT EXTRACTION PHACO AND  INTRAOCULAR LENS PLACEMENT ([place]);  Surgeon: Eulogio Bear, MD;  Location: [facility] ORS;  Service: Ophthalmology;  Laterality: Left;  [place] 00:41.9 AP% 14.9 CDE 6.23 Fluid pack lot # [id number]   CATARACT EXTRACTION W/PHACO Right 10/19/2016   Procedure: CATARACT EXTRACTION PHACO AND INTRAOCULAR LENS PLACEMENT (IOC);  Surgeon: Eulogio Bear, MD;  Location: [facility] ORS;  Service: Ophthalmology;  Laterality: Right;  casette lot # [id number] H [place]  00:59.5 AP%   16.5 CDE   9.79    COLONOSCOPY  06/2012   normal (in [place])   EYE SURGERY     TONSILLECTOMY     TUBAL LIGATION      Current Medications: Current Meds  Medication Sig   Acetaminophen (TYLENOL ARTHRITIS PAIN PO) Take 1 capsule by mouth 2 (two) times daily.   aspirin 81 MG chewable tablet Chew by mouth daily.   Bioflavonoids (BIOFLAVONOID-1000) 1000 MG TABS Take by mouth.   cetirizine (ZYRTEC) 10 MG tablet Take 10 mg by mouth as needed.    Dulaglutide 4.5 MG/0.5ML SOPN Inject into the skin once a week. SOLUM   glucose blood test strip    ivabradine (CORLANOR) 5 MG TABS tablet Take 2 tablets (10 mg total) by mouth once for 1 dose. TWO HOURS PRIOR TO CARDIAC CTA   lisinopril-hydrochlorothiazide (ZESTORETIC) 20-12.5 MG tablet TAKE 1 TABLET DAILY   metFORMIN (  GLUCOPHAGE) 1000 MG tablet TAKE 1 TABLET TWICE A DAY (Patient taking differently: Take 1,000 mg by mouth 2 (two) times daily. SOLUM)   metoprolol tartrate (LOPRESSOR) 100 MG tablet Take 1 tablet (100 mg total) by mouth once for 1 dose. TWO HOURS PRIOR TO CARDIAC CTA   Multiple Vitamins-Minerals (PRESERVISION AREDS PO) Take by mouth.   naproxen sodium (ANAPROX) 220 MG tablet Take 220 mg by mouth 2 (two) times daily with a meal. PRN   NON FORMULARY CBD ointment   Pyridoxine HCl (VITAMIN B6) 200 MG TABS Take 100 mg by mouth 2 (two) times daily.   simvastatin (ZOCOR) 20 MG tablet TAKE 1 TABLET DAILY   Turmeric (QC TUMERIC COMPLEX PO) Take by mouth.     Allergies:   Glipizide and Codeine    Social History   Socioeconomic History   Marital status: Widowed    Spouse name: Not on file   Number of children: 1   Years of education: Not on file   Highest education level: 12th grade  Occupational History   Occupation: Retired  Tobacco Use   Smoking status: Never   Smokeless tobacco: Never   Tobacco comments:    smoking cessation materials not required  Vaping Use   Vaping Use: Never used  Substance and Sexual Activity   Alcohol use: Not Currently    Alcohol/week: 1.0 standard drink of alcohol    Types: 1 Glasses of wine per week    Comment: once monthly maybe   Drug use: No   Sexual activity: Not Currently  Other Topics Concern   Not on file  Social History Narrative   Pt lives alone   Social Determinants of Health   Financial Resource Strain: Low Risk  (07/11/2021)   Overall Financial Resource Strain (CARDIA)    Difficulty of Paying Living Expenses: Not hard at all  Food Insecurity: No Food Insecurity (07/11/2021)   Hunger Vital Sign    Worried About Running Out of Food in the Last Year: Never true    Ran Out of Food in the Last Year: Never true  Transportation Needs: No Transportation Needs (07/11/2021)   PRAPARE - Hydrologist (Medical): No    Lack of Transportation (Non-Medical): No  Physical Activity: Inactive (07/11/2021)   Exercise Vital Sign    Days of Exercise per Week: 0 days    Minutes of Exercise per Session: 0 min  Stress: No Stress Concern Present (07/11/2021)   [place]    Feeling of Stress : Only a little  Social Connections: Socially Isolated (07/11/2021)   Social Connection and Isolation Panel [NHANES]    Frequency of Communication with Friends and Family: More than three times a week    Frequency of Social Gatherings with Friends and Family: More than three times a week    Attends Religious Services: Never    Marine scientist or Organizations:  No    Attends Archivist Meetings: Never    Marital Status: Widowed     Family History: The patient's family history includes Alzheimer's disease in her mother; Arthritis in her mother; Breast cancer in her maternal grandmother; CAD in her father; Diabetes in her maternal aunt; Heart attack in her child; Kidney disease in her father and maternal aunt; Obesity in her maternal grandmother; Stroke in her father.  ROS:   Please see the history of present illness.     All other systems  reviewed and are negative.  EKGs/Labs/Other Studies Reviewed:    The following studies were reviewed today:   EKG:  EKG is  ordered today.  The ekg ordered today demonstrates normal sinus rhythm.  Recent Labs: 07/26/2021: ALT 26; BUN 19; Creatinine, Ser 0.65; Hemoglobin 14.1; Platelets 237; Potassium 3.9; Sodium 141; TSH 1.110  Recent Lipid Panel    Component Value Date/Time   CHOL 139 01/18/2022 0000   CHOL 163 07/26/2021 1129   TRIG 221 (A) 01/18/2022 0000   HDL 46 01/18/2022 0000   HDL 54 07/26/2021 1129   CHOLHDL 3.0 07/26/2021 1129   LDLCALC 48 01/18/2022 0000   LDLCALC 80 07/26/2021 1129     Risk Assessment/Calculations:             Physical Exam:    VS:  BP 126/82 (BP Location: Right Arm)   Pulse 87   Ht 5\' 3"  (1.6 m)   Wt 218 lb 12.8 oz (99.2 kg)   SpO2 99%   BMI 38.76 kg/m     Wt Readings from Last 3 Encounters:  04/05/22 218 lb 12.8 oz (99.2 kg)  02/15/22 218 lb 12.8 oz (99.2 kg)  12/22/21 221 lb (100.2 kg)     GEN:  Well nourished, well developed in no acute distress HEENT: Normal NECK: No JVD; No carotid bruits CARDIAC: RRR, no murmurs, rubs, gallops RESPIRATORY:  Clear to auscultation without rales, wheezing or rhonchi  ABDOMEN: Soft, non-tender, non-distended MUSCULOSKELETAL:  No edema; No deformity  SKIN: Warm and dry NEUROLOGIC:  Alert and oriented x 3 PSYCHIATRIC:  Normal affect   ASSESSMENT:    1. Heart palpitations   2. Precordial pain   3.  Primary hypertension   4. Pure hypercholesterolemia    PLAN:    In order of problems listed above:  Palpitations, place cardiac monitor to evaluate any significant arrhythmias. Chest pain, risk factors hypertension, hyperlipidemia, family history of early CAD.  Will get echo, get coronary CT. Hypertension, BP controlled.  Continue lisinopril, HCTZ. Hyperlipidemia, continue simvastatin 20 mg daily.  Follow-up after cardiac testing.      Medication Adjustments/Labs and Tests Ordered: Current medicines are reviewed at length with the patient today.  Concerns regarding medicines are outlined above.  Orders Placed This Encounter  Procedures   CT CORONARY MORPH W/CTA COR W/SCORE W/CA W/CM &/OR WO/CM   Basic Metabolic Panel (BMET)   LONG TERM MONITOR (3-14 DAYS)   EKG 12-Lead   ECHOCARDIOGRAM COMPLETE   Meds ordered this encounter  Medications   metoprolol tartrate (LOPRESSOR) 100 MG tablet    Sig: Take 1 tablet (100 mg total) by mouth once for 1 dose. TWO HOURS PRIOR TO CARDIAC CTA    Dispense:  1 tablet    Refill:  0   ivabradine (CORLANOR) 5 MG TABS tablet    Sig: Take 2 tablets (10 mg total) by mouth once for 1 dose. TWO HOURS PRIOR TO CARDIAC CTA    Dispense:  2 tablet    Refill:  0    Patient Instructions  Medication Instructions:   Your physician recommends that you continue on your current medications as directed. Please refer to the Current Medication list given to you today.  *If you need a refill on your cardiac medications before your next appointment, please call your pharmacy*   Lab Work:  Your physician recommends you go to the medical mall for labs.   If you have labs (blood work) drawn today and your tests are completely normal,  you will receive your results only by: [place] (if you have MyChart) OR A paper copy in the mail If you have any lab test that is abnormal or we need to change your treatment, we will call you to review the  results.   Testing/Procedures:  Your physician has requested that you have an echocardiogram. Echocardiography is a painless test that uses sound waves to create images of your heart. It provides your doctor with information about the size and shape of your heart and how well your heart's chambers and valves are working. This procedure takes approximately one hour. There are no restrictions for this procedure. Please do NOT wear cologne, perfume, aftershave, or lotions (deodorant is allowed). Please arrive 15 minutes prior to your appointment time.  2. Your physician has recommended that you wear a Zio monitor.   This monitor is a medical device that records the heart's electrical activity. Doctors most often use these monitors to diagnose arrhythmias. Arrhythmias are problems with the speed or rhythm of the heartbeat. The monitor is a small device applied to your chest. You can wear one while you do your normal daily activities. While wearing this monitor if you have any symptoms to push the button and record what you felt. Once you have worn this monitor for the period of time provider prescribed (Usually 14 days), you will return the monitor device in the postage paid box. Once it is returned they will download the data collected and provide [place] with a report which the provider will then review and we will call you with those results. Important tips:  Avoid showering during the first 24 hours of wearing the monitor. Avoid excessive sweating to help maximize wear time. Do not submerge the device, no hot tubs, and no swimming pools. Keep any lotions or oils away from the patch. After 24 hours you may shower with the patch on. Take brief showers with your back facing the shower head.  Do not remove patch once it has been placed because that will interrupt data and decrease adhesive wear time. Push the button when you have any symptoms and write down what you were feeling. Once you have completed  wearing your monitor, remove and place into box which has postage paid and place in your outgoing mailbox.  If for some reason you have misplaced your box then call our office and we can provide another box and/or mail it off for you.    Your cardiac CT will be scheduled for April 18th, 2024 @ 1:45pm  [facility] [address], [place] [address] [phone]  If scheduled at [facility] or [facility], please arrive 15 mins early for check-in and test prep.   Please follow these instructions carefully (unless otherwise directed):    On the Night Before the Test: Be sure to Drink plenty of water. Do not consume any caffeinated/decaffeinated beverages or chocolate 12 hours prior to your test. Do not take any antihistamines 12 hours prior to your test.  On the Day of the Test: Drink plenty of water until 1 hour prior to the test. Do not eat any food 1 hour prior to test. Take metoprolol (Lopressor) two hours prior to test. Take Corlanor two hours prior to test HOLD Lisinopril / Hydrochlorothiazide on the morning of the test. FEMALES- please wear underwire-free bra if available, avoid dresses & tight clothing       After the Test: Drink  plenty of water. After receiving IV contrast, you may experience a mild flushed feeling. This is normal. On occasion, you may experience a mild rash up to 24 hours after the test. This is not dangerous. If this occurs, you can take Benadryl 25 mg and increase your fluid intake. If you experience trouble breathing, this can be serious. If it is severe call 911 IMMEDIATELY. If it is mild, please call our office. If you take any of these medications: Glipizide/Metformin, Avandament, Glucavance, please do not take 48 hours after completing test unless otherwise instructed.    Follow-Up: At [facility], you and your health needs are our  priority.  As part of our continuing mission to provide you with exceptional heart care, we have created designated Provider Care Teams.  These Care Teams include your primary Cardiologist (physician) and Advanced Practice Providers (APPs -  Physician Assistants and Nurse Practitioners) who all work together to provide you with the care you need, when you need it.  We recommend signing up for the patient portal called "MyChart".  Sign up information is provided on this After Visit Summary.  MyChart is used to connect with patients for Virtual Visits (Telemedicine).  Patients are able to view lab/test results, encounter notes, upcoming appointments, etc.  Non-urgent messages can be sent to your provider as well.   To learn more about what you can do with MyChart, go to [email or web address].    Your next appointment:    After testing Provider:    You may see Kate Sable, MD or one of the following Advanced Practice Providers on your designated Care Team:   Murray Hodgkins, NP Christell Faith, PA-C Cadence Kathlen Mody, PA-C Gerrie Nordmann, NP     Signed, Kate Sable, MD  04/05/2022 11:57 AM    [place]

## 2022-04-05 NOTE — Patient Instructions (Signed)
Medication Instructions:   Your physician recommends that you continue on your current medications as directed. Please refer to the Current Medication list given to you today.  *If you need a refill on your cardiac medications before your next appointment, please call your pharmacy*   Lab Work:  Your physician recommends you go to the medical mall for labs.   If you have labs (blood work) drawn today and your tests are completely normal, you will receive your results only by: [place] (if you have MyChart) OR A paper copy in the mail If you have any lab test that is abnormal or we need to change your treatment, we will call you to review the results.   Testing/Procedures:  Your physician has requested that you have an echocardiogram. Echocardiography is a painless test that uses sound waves to create images of your heart. It provides your doctor with information about the size and shape of your heart and how well your heart's chambers and valves are working. This procedure takes approximately one hour. There are no restrictions for this procedure. Please do NOT wear cologne, perfume, aftershave, or lotions (deodorant is allowed). Please arrive 15 minutes prior to your appointment time.  2. Your physician has recommended that you wear a Zio monitor.   This monitor is a medical device that records the heart's electrical activity. Doctors most often use these monitors to diagnose arrhythmias. Arrhythmias are problems with the speed or rhythm of the heartbeat. The monitor is a small device applied to your chest. You can wear one while you do your normal daily activities. While wearing this monitor if you have any symptoms to push the button and record what you felt. Once you have worn this monitor for the period of time provider prescribed (Usually 14 days), you will return the monitor device in the postage paid box. Once it is returned they will download the data collected and provide [place]  with a report which the provider will then review and we will call you with those results. Important tips:  Avoid showering during the first 24 hours of wearing the monitor. Avoid excessive sweating to help maximize wear time. Do not submerge the device, no hot tubs, and no swimming pools. Keep any lotions or oils away from the patch. After 24 hours you may shower with the patch on. Take brief showers with your back facing the shower head.  Do not remove patch once it has been placed because that will interrupt data and decrease adhesive wear time. Push the button when you have any symptoms and write down what you were feeling. Once you have completed wearing your monitor, remove and place into box which has postage paid and place in your outgoing mailbox.  If for some reason you have misplaced your box then call our office and we can provide another box and/or mail it off for you.    Your cardiac CT will be scheduled for April 18th, 2024 @ 1:45pm  [facility] [address] [place], [place] [address] [phone]  If scheduled at [facility] or [facility], please arrive 15 mins early for check-in and test prep.   Please follow these instructions carefully (unless otherwise directed):    On the Night Before the Test: Be sure to Drink plenty of water. Do not consume any caffeinated/decaffeinated beverages or chocolate 12 hours prior to your test. Do not take any antihistamines 12 hours prior to your  test.  On the Day of the Test: Drink plenty of water until 1 hour prior to the test. Do not eat any food 1 hour prior to test. Take metoprolol (Lopressor) two hours prior to test. Take Corlanor two hours prior to test HOLD Lisinopril / Hydrochlorothiazide on the morning of the test. FEMALES- please wear underwire-free bra if available, avoid dresses & tight clothing       After the  Test: Drink plenty of water. After receiving IV contrast, you may experience a mild flushed feeling. This is normal. On occasion, you may experience a mild rash up to 24 hours after the test. This is not dangerous. If this occurs, you can take Benadryl 25 mg and increase your fluid intake. If you experience trouble breathing, this can be serious. If it is severe call 911 IMMEDIATELY. If it is mild, please call our office. If you take any of these medications: Glipizide/Metformin, Avandament, Glucavance, please do not take 48 hours after completing test unless otherwise instructed.    Follow-Up: At [facility], you and your health needs are our priority.  As part of our continuing mission to provide you with exceptional heart care, we have created designated Provider Care Teams.  These Care Teams include your primary Cardiologist (physician) and Advanced Practice Providers (APPs -  Physician Assistants and Nurse Practitioners) who all work together to provide you with the care you need, when you need it.  We recommend signing up for the patient portal called "MyChart".  Sign up information is provided on this After Visit Summary.  MyChart is used to connect with patients for Virtual Visits (Telemedicine).  Patients are able to view lab/test results, encounter notes, upcoming appointments, etc.  Non-urgent messages can be sent to your provider as well.   To learn more about what you can do with MyChart, go to [email or web address].    Your next appointment:    After testing Provider:    You may see Kate Sable, MD or one of the following Advanced Practice Providers on your designated Care Team:   Murray Hodgkins, NP Christell Faith, PA-C Cadence Kathlen Mody, PA-C Gerrie Nordmann, NP

## 2022-04-07 ENCOUNTER — Telehealth: Payer: Self-pay | Admitting: Cardiology

## 2022-04-07 ENCOUNTER — Other Ambulatory Visit: Payer: Self-pay | Admitting: Internal Medicine

## 2022-04-07 ENCOUNTER — Other Ambulatory Visit: Payer: Self-pay

## 2022-04-07 DIAGNOSIS — I10 Essential (primary) hypertension: Secondary | ICD-10-CM

## 2022-04-07 DIAGNOSIS — R072 Precordial pain: Secondary | ICD-10-CM

## 2022-04-07 DIAGNOSIS — R002 Palpitations: Secondary | ICD-10-CM

## 2022-04-07 NOTE — Telephone Encounter (Signed)
Patient was ordered at her last OV to have a Cardiac CTA completed.   There is a future BMP order already in for this patient at the medical mall.   I will change it to [facility] so so can have her labs completed in [place].

## 2022-04-07 NOTE — Addendum Note (Signed)
Addended by: Janan Ridge on: 04/07/2022 12:59 PM   Modules accepted: Orders

## 2022-04-07 NOTE — Telephone Encounter (Signed)
Left voicemail message to call back  

## 2022-04-07 NOTE — Telephone Encounter (Signed)
Patient wants to confirm her lab orders are available for her to do blood work at the [facility] in [place].

## 2022-04-07 NOTE — Telephone Encounter (Signed)
Requested Prescriptions  Pending Prescriptions Disp Refills   simvastatin (ZOCOR) 20 MG tablet [Pharmacy Med Name: SIMVASTATIN  TAB 20MG ] 90 tablet 0    Sig: TAKE 1 TABLET DAILY     Cardiovascular:  Antilipid - Statins Failed - 04/07/2022  2:18 AM      Failed - Lipid Panel in normal range within the last 12 months    Cholesterol, Total  Date Value Ref Range Status  07/26/2021 163 100 - 199 mg/dL Final   Cholesterol  Date Value Ref Range Status  01/18/2022 139 0 - 200 Final   LDL Chol Calc (NIH)  Date Value Ref Range Status  07/26/2021 80 0 - 99 mg/dL Final   LDL Cholesterol  Date Value Ref Range Status  01/18/2022 48  Final   HDL  Date Value Ref Range Status  01/18/2022 46 35 - 70 Final  07/26/2021 54 >39 mg/dL Final   Triglycerides  Date Value Ref Range Status  01/18/2022 221 (A) 40 - 160 Final         Passed - Patient is not pregnant      Passed - Valid encounter within last 12 months    Recent Outpatient Visits           1 month ago Essential (primary) hypertension   [place] [facility], Jesse Sans, MD   3 months ago Influenza A H1N1 infection   [place] [facility] at [facility], Jesse Sans, MD   8 months ago Essential (primary) hypertension   [place] [facility] at [facility], Jesse Sans, MD   1 year ago Type II diabetes mellitus with complication [facility])   [place] at [facility], Jesse Sans, MD   1 year ago Essential (primary) hypertension    [place] at [facility], Jesse Sans, MD       Future Appointments             In 1 month Agbor-Etang, Aaron Edelman, MD [place] at [place]   In 3 months Glean Hess, MD [facility] at [facility], [place]             lisinopril-hydrochlorothiazide (ZESTORETIC)  20-12.5 MG tablet [Pharmacy Med Name: LISINOP/HCTZ TAB 20-12.5] 90 tablet 0    Sig: TAKE 1 TABLET DAILY     Cardiovascular:  ACEI + Diuretic Combos Failed - 04/07/2022  2:18 AM      Failed - Na in normal range and within 180 days    Sodium  Date Value Ref Range Status  07/26/2021 141 134 - 144 mmol/L Final         Failed - K in normal range and within 180 days    Potassium  Date Value Ref Range Status  07/26/2021 3.9 3.5 - 5.2 mmol/L Final         Failed - Cr in normal range and within 180 days    Creatinine, Ser  Date Value Ref Range Status  07/26/2021 0.65 0.57 - 1.00 mg/dL Final         Failed - eGFR is 30 or above and within 180 days    GFR calc Af Amer  Date Value Ref Range Status  11/06/2019 92 >59 mL/min/1.73 Final    Comment:    **In accordance with recommendations from the NKF-ASN Task  force,**   [facility] is in the process of updating its eGFR calculation to the   2021 CKD-EPI creatinine equation that estimates kidney function   without a race variable.    GFR calc non Af Amer  Date Value Ref Range Status  11/06/2019 80 >59 mL/min/1.73 Final   eGFR  Date Value Ref Range Status  07/26/2021 92 >59 mL/min/1.73 Final         Passed - Patient is not pregnant      Passed - Last BP in normal range    BP Readings from Last 1 Encounters:  04/05/22 126/82         Passed - Valid encounter within last 6 months    Recent Outpatient Visits           1 month ago Essential (primary) hypertension   [place] [facility] at [facility], Jesse Sans, MD   3 months ago Influenza A H1N1 infection   [place] [facility] at [facility], Jesse Sans, MD   8 months ago Essential (primary) hypertension   [place] [facility] at [facility], Jesse Sans, MD   1 year ago Type II diabetes mellitus with complication [facility])   [place] [facility] at [facility], Jesse Sans, MD   1 year ago Essential (primary) hypertension   [place] [facility] [place] at [facility], Jesse Sans, MD       Future Appointments             In 1 month Agbor-Etang, Aaron Edelman, MD [address] at [place]   In 3 months Army Melia, Jesse Sans, MD [place] at [facility], [facility]

## 2022-04-14 DIAGNOSIS — R072 Precordial pain: Secondary | ICD-10-CM | POA: Diagnosis not present

## 2022-04-14 DIAGNOSIS — I10 Essential (primary) hypertension: Secondary | ICD-10-CM | POA: Diagnosis not present

## 2022-04-14 DIAGNOSIS — R002 Palpitations: Secondary | ICD-10-CM | POA: Diagnosis not present

## 2022-04-15 LAB — BASIC METABOLIC PANEL
BUN/Creatinine Ratio: 26 (ref 12–28)
BUN: 18 mg/dL (ref 8–27)
CO2: 24 mmol/L (ref 20–29)
Calcium: 10.8 mg/dL — ABNORMAL HIGH (ref 8.7–10.3)
Chloride: 97 mmol/L (ref 96–106)
Creatinine, Ser: 0.7 mg/dL (ref 0.57–1.00)
Glucose: 155 mg/dL — ABNORMAL HIGH (ref 70–99)
Potassium: 4.3 mmol/L (ref 3.5–5.2)
Sodium: 140 mmol/L (ref 134–144)
eGFR: 91 mL/min/{1.73_m2} (ref >59)

## 2022-04-25 ENCOUNTER — Telehealth (HOSPITAL_COMMUNITY): Payer: Self-pay | Admitting: *Deleted

## 2022-04-25 ENCOUNTER — Telehealth (HOSPITAL_COMMUNITY): Payer: Self-pay | Admitting: Emergency Medicine

## 2022-04-25 NOTE — Telephone Encounter (Signed)
Attempted to call patient regarding upcoming cardiac CT appointment. °Left message on voicemail with name and callback number °Adina Puzzo RN Navigator Cardiac Imaging °[facility] °[phone] Office °[phone] Cell ° °

## 2022-04-25 NOTE — Telephone Encounter (Signed)
Patient returning call about her upcoming cardiac imaging study; pt verbalizes understanding of appt date/time, parking situation and where to check in, pre-test NPO status and medications ordered, and verified current allergies; name and call back number provided for further questions should they arise  Adolphus Hanf RN Navigator Cardiac Imaging [facility] [phone] office [phone] cell  Patient to take 100mg metoprolol tartrate and 10mg ivabradine two hours prior to her cardiac CT scan. 

## 2022-04-27 ENCOUNTER — Ambulatory Visit
Admission: RE | Admit: 2022-04-27 | Discharge: 2022-04-27 | Disposition: A | Discharge status: Home / Self Care | Payer: Medicare Other | Source: Ambulatory Visit | Attending: Cardiology | Admitting: Cardiology

## 2022-04-27 ENCOUNTER — Other Ambulatory Visit: Payer: Self-pay | Admitting: Cardiology

## 2022-04-27 ENCOUNTER — Other Ambulatory Visit (HOSPITAL_COMMUNITY): Payer: Self-pay | Admitting: *Deleted

## 2022-04-27 DIAGNOSIS — I10 Essential (primary) hypertension: Secondary | ICD-10-CM

## 2022-04-27 DIAGNOSIS — R072 Precordial pain: Secondary | ICD-10-CM

## 2022-04-27 DIAGNOSIS — E7800 Pure hypercholesterolemia, unspecified: Secondary | ICD-10-CM

## 2022-04-27 DIAGNOSIS — R002 Palpitations: Secondary | ICD-10-CM

## 2022-04-27 MED ORDER — NITROGLYCERIN 0.4 MG SL SUBL
0.8000 mg | SUBLINGUAL_TABLET | Freq: Once | SUBLINGUAL | Status: AC
  Administered 2022-04-27: 0.8 mg via SUBLINGUAL

## 2022-04-27 MED ORDER — METOPROLOL TARTRATE 5 MG/5ML IV SOLN: 10.0000 mg | Freq: Once | INTRAVENOUS | Status: DC

## 2022-04-27 MED ORDER — IVABRADINE HCL 7.5 MG PO TABS: ORAL_TABLET | ORAL | 0 refills | Status: DC

## 2022-04-27 MED ORDER — IOHEXOL 350 MG/ML SOLN
100.0000 mL | Freq: Once | INTRAVENOUS | Status: AC | PRN
  Administered 2022-04-27: 100 mL via INTRAVENOUS

## 2022-04-27 MED ORDER — DILTIAZEM HCL 25 MG/5ML IV SOLN
10.0000 mg | Freq: Once | INTRAVENOUS | Status: AC
  Administered 2022-04-27: 10 mg via INTRAVENOUS

## 2022-04-27 MED ORDER — METOPROLOL TARTRATE 100 MG PO TABS: 100.0000 mg | ORAL_TABLET | Freq: Once | ORAL | 0 refills | Status: DC

## 2022-04-27 NOTE — Progress Notes (Signed)
Unable to complete test (see VS), patient tolerated all that was done well. Dr. Myriam Forehand notified of VS. Patient aware Dr. will consider alternate testing. No distress. Feels "normal" with standing and walking. Uses walker for assistance normally. Friend at side. D/C home.

## 2022-05-01 DIAGNOSIS — R072 Precordial pain: Secondary | ICD-10-CM | POA: Diagnosis not present

## 2022-05-01 DIAGNOSIS — R002 Palpitations: Secondary | ICD-10-CM | POA: Diagnosis not present

## 2022-05-09 DIAGNOSIS — H353132 Nonexudative age-related macular degeneration, bilateral, intermediate dry stage: Secondary | ICD-10-CM | POA: Diagnosis not present

## 2022-05-09 DIAGNOSIS — Z961 Presence of intraocular lens: Secondary | ICD-10-CM | POA: Diagnosis not present

## 2022-05-09 DIAGNOSIS — E119 Type 2 diabetes mellitus without complications: Secondary | ICD-10-CM | POA: Diagnosis not present

## 2022-05-09 LAB — HM DIABETES EYE EXAM

## 2022-05-10 ENCOUNTER — Encounter: Payer: Self-pay | Admitting: Internal Medicine

## 2022-05-10 ENCOUNTER — Ambulatory Visit: Payer: Medicare Other | Attending: Cardiology

## 2022-05-10 DIAGNOSIS — R072 Precordial pain: Secondary | ICD-10-CM

## 2022-05-10 LAB — ECHOCARDIOGRAM COMPLETE
AR max vel: 1.35 cm2
AV Area VTI: 1.3 cm2
AV Area mean vel: 1.33 cm2
AV Mean grad: 15.8 mmHg
AV Peak grad: 26.7 mmHg
Ao pk vel: 2.58 m/s
Area-P 1/2: 4.74 cm2
Calc EF: 60.3 %
S' Lateral: 3 cm
Single Plane A2C EF: 56.9 %
Single Plane A4C EF: 63.7 %

## 2022-05-17 ENCOUNTER — Ambulatory Visit: Payer: Medicare Other | Attending: Cardiology | Admitting: Cardiology

## 2022-05-17 ENCOUNTER — Encounter: Payer: Self-pay | Admitting: Cardiology

## 2022-05-17 VITALS — BP 102/56 | HR 88 | Ht 63.0 in | Wt 219.6 lb

## 2022-05-17 DIAGNOSIS — I10 Essential (primary) hypertension: Secondary | ICD-10-CM

## 2022-05-17 DIAGNOSIS — I471 Supraventricular tachycardia, unspecified: Secondary | ICD-10-CM

## 2022-05-17 DIAGNOSIS — E7800 Pure hypercholesterolemia, unspecified: Secondary | ICD-10-CM

## 2022-05-17 DIAGNOSIS — R072 Precordial pain: Secondary | ICD-10-CM | POA: Diagnosis not present

## 2022-05-17 NOTE — Progress Notes (Signed)
Cardiology Office Note:    Date:  05/17/2022   ID:  SKYLLA GARAND, DOB [birth year], MRN [id number]  PCP:  Reubin Milan, MD   [facility] Providers Cardiologist:  Debbe Odea, MD     Referring MD: Reubin Milan, MD   Chief Complaint  Patient presents with   Follow-up    Patient denies new or acute cardiac problems/concerns today.  Patient unable to complete CTA and states she was not contacted to have rescheduled.      History of Present Illness:    CAMBER DRINNEN is a 77 y.o. female with a hx of hypertension, hyperlipidemia, diabetes who presents for follow-up.  Previously seen due to palpitations and chest pain.  Cardiac monitor, echo and CTA obtained to evaluate cardiac function.  Coronary CTA did not perform due to elevated heart rates.  Symptoms of palpitations overall not very frequent, occurring now less than once a month.  Denies any dizziness, syncope.  Prior notes  has a family history of MI with father having heart attack in his 84s, her son recently had coronary artery disease requiring stents, also in his 53s.    Moved to the area from [place], states having a stress test about 10 years ago.   Past Medical History:  Diagnosis Date   Allergy    Anemia    Came from menstrual issues   Anxiety    Arthritis    Bronchitis    Cataract 2018   COPD (chronic obstructive pulmonary disease) (HCC)    Diabetes mellitus without complication (HCC)    Facial basal cell cancer 10/31/2016   also left lower eyelid, chest and shoulder   Hypercholesteremia    Hypertension    Long-term use of high-risk medication 11/10/2014   Actos started 11/2014    Lower extremity edema     Past Surgical History:  Procedure Laterality Date   ABDOMINAL HYSTERECTOMY     BASAL CELL CARCINOMA EXCISION     CARPAL TUNNEL RELEASE Bilateral    CARPAL TUNNEL RELEASE     CATARACT EXTRACTION W/PHACO Left 09/07/2016   Procedure: CATARACT EXTRACTION PHACO AND INTRAOCULAR  LENS PLACEMENT (IOC);  Surgeon: Nevada Crane, MD;  Location: [facility] ORS;  Service: Ophthalmology;  Laterality: Left;  [place] 00:41.9 AP% 14.9 CDE 6.23 Fluid pack lot # [id number]   CATARACT EXTRACTION W/PHACO Right 10/19/2016   Procedure: CATARACT EXTRACTION PHACO AND INTRAOCULAR LENS PLACEMENT (IOC);  Surgeon: Nevada Crane, MD;  Location: [facility] ORS;  Service: Ophthalmology;  Laterality: Right;  casette lot # [id number] H [place]  00:59.5 AP%   16.5 CDE   9.79    COLONOSCOPY  06/2012   normal (in [place])   EYE SURGERY     TONSILLECTOMY     TUBAL LIGATION      Current Medications: Current Meds  Medication Sig   Acetaminophen (TYLENOL ARTHRITIS PAIN PO) Take 1 capsule by mouth 2 (two) times daily.   aspirin 81 MG chewable tablet Chew by mouth daily.   Bioflavonoids (BIOFLAVONOID-1000) 1000 MG TABS Take by mouth.   cetirizine (ZYRTEC) 10 MG tablet Take 10 mg by mouth as needed.    Dulaglutide 4.5 MG/0.5ML SOPN Inject into the skin once a week. SOLUM   glucose blood test strip    lisinopril-hydrochlorothiazide (ZESTORETIC) 20-12.5 MG tablet TAKE 1 TABLET DAILY   metFORMIN (GLUCOPHAGE) 1000 MG tablet TAKE 1 TABLET TWICE A DAY (Patient taking differently: Take 1,000 mg by mouth 2 (two) times daily. SOLUM)  Multiple Vitamins-Minerals (PRESERVISION AREDS PO) Take by mouth.   naproxen sodium (ANAPROX) 220 MG tablet Take 220 mg by mouth 2 (two) times daily with a meal. PRN   NON FORMULARY CBD ointment   Pyridoxine HCl (VITAMIN B6) 200 MG TABS Take 100 mg by mouth 2 (two) times daily.   simvastatin (ZOCOR) 20 MG tablet TAKE 1 TABLET DAILY   Turmeric (QC TUMERIC COMPLEX PO) Take by mouth.     Allergies:   Glipizide, Latex, and Codeine   Social History   Socioeconomic History   Marital status: Widowed    Spouse name: Not on file   Number of children: 1   Years of education: Not on file   Highest education level: 12th grade  Occupational History   Occupation: Retired  Tobacco Use   Smoking  status: Never   Smokeless tobacco: Never   Tobacco comments:    smoking cessation materials not required  Vaping Use   Vaping Use: Never used  Substance and Sexual Activity   Alcohol use: Not Currently    Alcohol/week: 1.0 standard drink of alcohol    Types: 1 Glasses of wine per week    Comment: once monthly maybe   Drug use: No   Sexual activity: Not Currently  Other Topics Concern   Not on file  Social History Narrative   Pt lives alone   Social Determinants of Health   Financial Resource Strain: Low Risk  (07/11/2021)   Overall Financial Resource Strain (CARDIA)    Difficulty of Paying Living Expenses: Not hard at all  Food Insecurity: No Food Insecurity (07/11/2021)   Hunger Vital Sign    Worried About Running Out of Food in the Last Year: Never true    Ran Out of Food in the Last Year: Never true  Transportation Needs: No Transportation Needs (07/11/2021)   PRAPARE - Administrator, Civil Service (Medical): No    Lack of Transportation (Non-Medical): No  Physical Activity: Inactive (07/11/2021)   Exercise Vital Sign    Days of Exercise per Week: 0 days    Minutes of Exercise per Session: 0 min  Stress: No Stress Concern Present (07/11/2021)   [employer] of Occupational Health - Occupational Stress Questionnaire    Feeling of Stress : Only a little  Social Connections: Socially Isolated (07/11/2021)   Social Connection and Isolation Panel [NHANES]    Frequency of Communication with Friends and Family: More than three times a week    Frequency of Social Gatherings with Friends and Family: More than three times a week    Attends Religious Services: Never    Database administrator or Organizations: No    Attends Banker Meetings: Never    Marital Status: Widowed     Family History: The patient's family history includes Alzheimer's disease in her mother; Arthritis in her mother; Breast cancer in her maternal grandmother; CAD in her father; Diabetes  in her maternal aunt; Heart attack in her child; Kidney disease in her father and maternal aunt; Obesity in her maternal grandmother; Stroke in her father.  ROS:   Please see the history of present illness.     All other systems reviewed and are negative.  EKGs/Labs/Other Studies Reviewed:    The following studies were reviewed today:   EKG:  EKG is  ordered today.  The ekg ordered today demonstrates normal sinus rhythm.  Recent Labs: 07/26/2021: ALT 26; Hemoglobin 14.1; Platelets 237; TSH 1.110 04/14/2022: BUN 18;  Creatinine, Ser 0.70; Potassium 4.3; Sodium 140  Recent Lipid Panel    Component Value Date/Time   CHOL 139 01/18/2022 0000   CHOL 163 07/26/2021 1129   TRIG 221 (A) 01/18/2022 0000   HDL 46 01/18/2022 0000   HDL 54 07/26/2021 1129   CHOLHDL 3.0 07/26/2021 1129   LDLCALC 48 01/18/2022 0000   LDLCALC 80 07/26/2021 1129     Risk Assessment/Calculations:             Physical Exam:    VS:  BP (!) 102/56 (BP Location: Left Arm, Patient Position: Sitting, Cuff Size: Normal)   Pulse 88   Ht 5\' 3"  (1.6 m)   Wt 219 lb 9.6 oz (99.6 kg)   BMI 38.90 kg/m     Wt Readings from Last 3 Encounters:  05/17/22 219 lb 9.6 oz (99.6 kg)  04/05/22 218 lb 12.8 oz (99.2 kg)  02/15/22 218 lb 12.8 oz (99.2 kg)     GEN:  Well nourished, well developed in no acute distress HEENT: Normal NECK: No JVD; No carotid bruits CARDIAC: RRR, no murmurs, rubs, gallops RESPIRATORY:  Clear to auscultation without rales, wheezing or rhonchi  ABDOMEN: Soft, non-tender, non-distended MUSCULOSKELETAL:  No edema; No deformity  SKIN: Warm and dry NEUROLOGIC:  Alert and oriented x 3 PSYCHIATRIC:  Normal affect   ASSESSMENT:    1. Paroxysmal SVT (supraventricular tachycardia)   2. Precordial pain   3. Primary hypertension   4. Pure hypercholesterolemia     PLAN:    In order of problems listed above:  Palpitations, cardiac monitor 04/04/2021, occasional paroxysmal SVT, no sustained  significant arrhythmias.  Symptoms occur less than once a month. continue to monitor off beta-blocker. Chest pain, echo with normal EF, no findings to suggest etiology of chest pain.  Calcium score 6.19, plan to reschedule coronary CTA on dorsal scanner. Hypertension, BP controlled.  Continue lisinopril, HCTZ. Hyperlipidemia, LDL at goal, continue simvastatin 20 mg daily.  Follow-up in 6 weeks      Medication Adjustments/Labs and Tests Ordered: Current medicines are reviewed at length with the patient today.  Concerns regarding medicines are outlined above.  No orders of the defined types were placed in this encounter.  No orders of the defined types were placed in this encounter.   Patient Instructions  Medication Instructions:   None Ordered  *If you need a refill on your cardiac medications before your next appointment, please call your pharmacy*   Lab Work:  None Ordered  If you have labs (blood work) drawn today and your tests are completely normal, you will receive your results only by: MyChart Message (if you have MyChart) OR A paper copy in the mail If you have any lab test that is abnormal or we need to change your treatment, we will call you to review the results.   Testing/Procedures:    Your cardiac CT will be scheduled at one of the below locations:   [facility] [address] [place], [place] [address] [phone]  OR  [facility] [address] [place], [place] [address] [phone]  OR   [facility] [address] [place], [place] [address] [phone]  If scheduled at [facility], please arrive at the [facility] and Children's Entrance (Entrance C2) of [facility] 30 minutes prior to test start time. You can use the FREE valet parking offered at entrance C (encouraged to control the heart rate for  the test)  Proceed to the [facility]  Radiology Department (first floor) to check-in and test prep.  All radiology patients and guests should use entrance C2 at [facility], accessed from [facility], even though the hospital's physical address listed is [address]    If scheduled at [facility] or [facility], please arrive 15 mins early for check-in and test prep.   Please follow these instructions carefully (unless otherwise directed):  Hold all erectile dysfunction medications at least 3 days (72 hrs) prior to test. (Ie viagra, cialis, sildenafil, tadalafil, etc) We will administer nitroglycerin during this exam.   On the Night Before the Test: Be sure to Drink plenty of water. Do not consume any caffeinated/decaffeinated beverages or chocolate 12 hours prior to your test. Do not take any antihistamines 12 hours prior to your test.  On the Day of the Test: Drink plenty of water until 1 hour prior to the test. Do not eat any food 1 hour prior to test. You may take your regular medications prior to the test.  Take metoprolol (Lopressor) two hours prior to test. Lisinopril - Hydrochlorothiazide, please HOLD on the morning of the test. FEMALES- please wear underwire-free bra if available, avoid dresses & tight clothing        After the Test: Drink plenty of water. After receiving IV contrast, you may experience a mild flushed feeling. This is normal. On occasion, you may experience a mild rash up to 24 hours after the test. This is not dangerous. If this occurs, you can take Benadryl 25 mg and increase your fluid intake. If you experience trouble breathing, this can be serious. If it is severe call 911 IMMEDIATELY. If it is mild, please call our office. If you take any of these medications: Glipizide/Metformin, Avandament, Glucavance, please do not take 48 hours after completing test unless otherwise instructed.  We will call to schedule  your test 2-4 weeks out understanding that some insurance companies will need an authorization prior to the service being performed.   For non-scheduling related questions, please contact the cardiac imaging nurse navigator should you have any questions/concerns: Rockwell Alexandria, Cardiac Imaging Nurse Navigator Larey Brick, Cardiac Imaging Nurse Navigator [facility] Direct Office Dial: [phone]   For scheduling needs, including cancellations and rescheduling, please call [place], [phone].    Follow-Up: At [facility], you and your health needs are our priority.  As part of our continuing mission to provide you with exceptional heart care, we have created designated Provider Care Teams.  These Care Teams include your primary Cardiologist (physician) and Advanced Practice Providers (APPs -  Physician Assistants and Nurse Practitioners) who all work together to provide you with the care you need, when you need it.  We recommend signing up for the patient portal called "MyChart".  Sign up information is provided on this After Visit Summary.  MyChart is used to connect with patients for Virtual Visits (Telemedicine).  Patients are able to view lab/test results, encounter notes, upcoming appointments, etc.  Non-urgent messages can be sent to your provider as well.   To learn more about what you can do with MyChart, go to [email or web address].    Your next appointment:   1 month(s)  Provider:   You may see Debbe Odea, MD or one of the following Advanced Practice Providers on your designated Care Team:   Nicolasa Ducking, NP Eula Listen, PA-C Cadence Fransico Michael, [place]  Charlsie Quest, NP   Signed, Debbe Odea, MD  05/17/2022 12:41 PM    [facility]

## 2022-05-17 NOTE — Patient Instructions (Signed)
Medication Instructions:   None Ordered  *If you need a refill on your cardiac medications before your next appointment, please call your pharmacy*   Lab Work:  None Ordered  If you have labs (blood work) drawn today and your tests are completely normal, you will receive your results only by: MyChart Message (if you have MyChart) OR A paper copy in the mail If you have any lab test that is abnormal or we need to change your treatment, we will call you to review the results.   Testing/Procedures:    Your cardiac CT will be scheduled at one of the below locations:   [facility] [address] [place], [place] [address] [phone]  OR  [facility] [address] [place], [place] [address] [phone]  OR   [facility] [address] [place], [place] [address] [phone]  If scheduled at [facility], please arrive at the [facility] and Children's Entrance (Entrance C2) of [facility] 30 minutes prior to test start time. You can use the FREE valet parking offered at entrance C (encouraged to control the heart rate for the test)  Proceed to the [facility] Radiology Department (first floor) to check-in and test prep.  All radiology patients and guests should use entrance C2 at [facility], accessed from [facility], even though the hospital's physical address listed is [address]    If scheduled at [facility] or [facility], please arrive 15 mins early for check-in and test prep.   Please follow these instructions carefully (unless otherwise directed):  Hold all erectile dysfunction medications at least 3 days (72 hrs) prior to test. (Ie viagra, cialis, sildenafil, tadalafil, etc) We will administer nitroglycerin during this exam.   On the Night Before the Test: Be sure to Drink plenty  of water. Do not consume any caffeinated/decaffeinated beverages or chocolate 12 hours prior to your test. Do not take any antihistamines 12 hours prior to your test.  On the Day of the Test: Drink plenty of water until 1 hour prior to the test. Do not eat any food 1 hour prior to test. You may take your regular medications prior to the test.  Take metoprolol (Lopressor) two hours prior to test. Lisinopril - Hydrochlorothiazide, please HOLD on the morning of the test. FEMALES- please wear underwire-free bra if available, avoid dresses & tight clothing        After the Test: Drink plenty of water. After receiving IV contrast, you may experience a mild flushed feeling. This is normal. On occasion, you may experience a mild rash up to 24 hours after the test. This is not dangerous. If this occurs, you can take Benadryl 25 mg and increase your fluid intake. If you experience trouble breathing, this can be serious. If it is severe call 911 IMMEDIATELY. If it is mild, please call our office. If you take any of these medications: Glipizide/Metformin, Avandament, Glucavance, please do not take 48 hours after completing test unless otherwise instructed.  We will call to schedule your test 2-4 weeks out understanding that some insurance companies will need an authorization prior to the service being performed.   For non-scheduling related questions, please contact the cardiac imaging nurse navigator should you have any questions/concerns: Rockwell Alexandria, Cardiac Imaging Nurse Navigator Larey Brick, Cardiac Imaging Nurse Navigator [facility] Direct Office Dial: [phone]  For scheduling needs, including cancellations and rescheduling, please call [place], [phone].    Follow-Up: At [facility], you and your health needs are our priority.  As part of our continuing mission to provide you with exceptional heart care, we have created designated  Provider Care Teams.  These Care Teams include your primary Cardiologist (physician) and Advanced Practice Providers (APPs -  Physician Assistants and Nurse Practitioners) who all work together to provide you with the care you need, when you need it.  We recommend signing up for the patient portal called "MyChart".  Sign up information is provided on this After Visit Summary.  MyChart is used to connect with patients for Virtual Visits (Telemedicine).  Patients are able to view lab/test results, encounter notes, upcoming appointments, etc.  Non-urgent messages can be sent to your provider as well.   To learn more about what you can do with MyChart, go to [email or web address].    Your next appointment:   1 month(s)  Provider:   You may see Debbe Odea, MD or one of the following Advanced Practice Providers on your designated Care Team:   Nicolasa Ducking, NP Eula Listen, PA-C Cadence Fransico Michael, PA-C Charlsie Quest, NP

## 2022-05-23 ENCOUNTER — Other Ambulatory Visit: Payer: Self-pay

## 2022-05-23 DIAGNOSIS — I700 Atherosclerosis of aorta: Secondary | ICD-10-CM

## 2022-05-23 DIAGNOSIS — R079 Chest pain, unspecified: Secondary | ICD-10-CM

## 2022-05-30 DIAGNOSIS — E1129 Type 2 diabetes mellitus with other diabetic kidney complication: Secondary | ICD-10-CM | POA: Diagnosis not present

## 2022-05-30 DIAGNOSIS — R809 Proteinuria, unspecified: Secondary | ICD-10-CM | POA: Diagnosis not present

## 2022-05-30 DIAGNOSIS — E1159 Type 2 diabetes mellitus with other circulatory complications: Secondary | ICD-10-CM | POA: Diagnosis not present

## 2022-05-30 LAB — HEMOGLOBIN A1C: Hemoglobin A1C: 7.6

## 2022-06-23 ENCOUNTER — Telehealth (HOSPITAL_COMMUNITY): Payer: Self-pay | Admitting: Emergency Medicine

## 2022-06-23 NOTE — Telephone Encounter (Signed)
Reaching out to patient to offer assistance regarding upcoming cardiac imaging study; pt verbalizes understanding of appt date/time, parking situation and where to check in, pre-test NPO status and medications ordered, and verified current allergies; name and call back number provided for further questions should they arise Mikhai Bienvenue RN Navigator Cardiac Imaging [facility] [phone] office [phone] cell 

## 2022-06-23 NOTE — Telephone Encounter (Signed)
Attempted to call patient regarding upcoming cardiac CT appointment. °Left message on voicemail with name and callback number °Ahaana Rochette RN Navigator Cardiac Imaging °[facility] °[phone] Office °[phone] Cell ° °

## 2022-06-26 ENCOUNTER — Ambulatory Visit
Admission: RE | Admit: 2022-06-26 | Discharge: 2022-06-26 | Disposition: A | Discharge status: Home / Self Care | Payer: Medicare Other | Source: Ambulatory Visit | Attending: Cardiology | Admitting: Cardiology

## 2022-06-26 DIAGNOSIS — I700 Atherosclerosis of aorta: Secondary | ICD-10-CM | POA: Insufficient documentation

## 2022-06-26 LAB — POCT I-STAT CREATININE: Creatinine, Ser: 0.7 mg/dL (ref 0.44–1.00)

## 2022-06-26 MED ORDER — DILTIAZEM HCL 25 MG/5ML IV SOLN
10.0000 mg | Freq: Once | INTRAVENOUS | Status: AC
  Administered 2022-06-26: 5 mg via INTRAVENOUS
  Filled 2022-06-26: qty 5

## 2022-06-26 MED ORDER — IOHEXOL 350 MG/ML SOLN
100.0000 mL | Freq: Once | INTRAVENOUS | Status: AC | PRN
  Administered 2022-06-26: 100 mL via INTRAVENOUS

## 2022-06-26 MED ORDER — DILTIAZEM HCL 25 MG/5ML IV SOLN
INTRAVENOUS | Status: AC
  Filled 2022-06-26: qty 5

## 2022-06-26 MED ORDER — METOPROLOL TARTRATE 5 MG/5ML IV SOLN
10.0000 mg | Freq: Once | INTRAVENOUS | Status: AC
  Administered 2022-06-26: 10 mg via INTRAVENOUS
  Filled 2022-06-26: qty 10

## 2022-06-26 MED ORDER — NITROGLYCERIN 0.4 MG SL SUBL
0.8000 mg | SUBLINGUAL_TABLET | Freq: Once | SUBLINGUAL | Status: AC
  Administered 2022-06-26: 0.4 mg via SUBLINGUAL
  Filled 2022-06-26: qty 25

## 2022-06-26 NOTE — Progress Notes (Signed)
Patient tolerated procedure well. Ambulate w/o difficulty. Denies any lightheadedness or being dizzy. Pt denies any pain at this time. Sitting in chair. Pt is encouraged to drink additional water throughout the day and reason explained to patient. Patient verbalized understanding and all questions answered. ABC intact. No further needs at this time. Discharge from procedure area w/o issues. 

## 2022-06-28 ENCOUNTER — Encounter: Payer: Self-pay | Admitting: Cardiology

## 2022-06-28 ENCOUNTER — Ambulatory Visit: Payer: Medicare Other | Attending: Cardiology | Admitting: Cardiology

## 2022-06-28 VITALS — BP 104/56 | HR 88 | Ht 63.0 in | Wt 218.0 lb

## 2022-06-28 DIAGNOSIS — I10 Essential (primary) hypertension: Secondary | ICD-10-CM | POA: Insufficient documentation

## 2022-06-28 DIAGNOSIS — R072 Precordial pain: Secondary | ICD-10-CM | POA: Insufficient documentation

## 2022-06-28 DIAGNOSIS — E7800 Pure hypercholesterolemia, unspecified: Secondary | ICD-10-CM | POA: Insufficient documentation

## 2022-06-28 NOTE — Patient Instructions (Signed)
Medication Instructions:   Your physician recommends that you continue on your current medications as directed. Please refer to the Current Medication list given to you today.  *If you need a refill on your cardiac medications before your next appointment, please call your pharmacy*   Lab Work:  None Ordered  If you have labs (blood work) drawn today and your tests are completely normal, you will receive your results only by: MyChart Message (if you have MyChart) OR A paper copy in the mail If you have any lab test that is abnormal or we need to change your treatment, we will call you to review the results.   Testing/Procedures:  None Ordered    Follow-Up: At [facility], you and your health needs are our priority.  As part of our continuing mission to provide you with exceptional heart care, we have created designated Provider Care Teams.  These Care Teams include your primary Cardiologist (physician) and Advanced Practice Providers (APPs -  Physician Assistants and Nurse Practitioners) who all work together to provide you with the care you need, when you need it.  We recommend signing up for the patient portal called "MyChart".  Sign up information is provided on this After Visit Summary.  MyChart is used to connect with patients for Virtual Visits (Telemedicine).  Patients are able to view lab/test results, encounter notes, upcoming appointments, etc.  Non-urgent messages can be sent to your provider as well.   To learn more about what you can do with MyChart, go to [email or web address].    Your next appointment:   12 month(s)  Provider:   You may see Brian Agbor-Etang, MD or one of the following Advanced Practice Providers on your designated Care Team:   Christopher Berge, NP Ryan Dunn, PA-C Cadence Furth, PA-C Sheri Hammock, NP  

## 2022-06-28 NOTE — Progress Notes (Signed)
Cardiology Office Note:    Date:  06/28/2022   ID:  [name], DOB May 05, 1945, MRN [id number]  PCP:  Reubin Milan, MD   [facility] Providers Cardiologist:  Debbe Odea, MD     Referring MD: Reubin Milan, MD   Chief Complaint  Patient presents with   Follow-up    Patient denies new or acute cardiac problems/concerns today.      History of Present Illness:    Nancy Chen is a 77 y.o. female with a hx of hypertension, hyperlipidemia, diabetes who presents for follow-up.    Last seen due to symptoms of chest pain, coronary CTA was obtained to evaluate CAD.  Describes pain as sharp, nonexertional.  Previous echo showed normal systolic function EF 60 to 65%.  He is feeling well today, no concerns at this time.   Prior notes Echo 5/24 EF 60 to 65%  has a family history of MI with father having heart attack in his 27s, her son recently had coronary artery disease requiring stents, also in his 76s.    Moved to the area from [place], states having a stress test about 10 years ago.   Past Medical History:  Diagnosis Date   Allergy    Anemia    Came from menstrual issues   Anxiety    Arthritis    Bronchitis    Cataract 2018   COPD (chronic obstructive pulmonary disease) (HCC)    Diabetes mellitus without complication (HCC)    Facial basal cell cancer 10/31/2016   also left lower eyelid, chest and shoulder   Hypercholesteremia    Hypertension    Long-term use of high-risk medication 11/10/2014   Actos started 11/2014    Lower extremity edema     Past Surgical History:  Procedure Laterality Date   ABDOMINAL HYSTERECTOMY     BASAL CELL CARCINOMA EXCISION     CARPAL TUNNEL RELEASE Bilateral    CARPAL TUNNEL RELEASE     CATARACT EXTRACTION W/PHACO Left 09/07/2016   Procedure: CATARACT EXTRACTION PHACO AND INTRAOCULAR LENS PLACEMENT (IOC);  Surgeon: Nevada Crane, MD;  Location: [facility];  Service: Ophthalmology;  Laterality: Left;   [place] 00:41.9 AP% 14.9 CDE 6.23 Fluid pack lot # [id number]   CATARACT EXTRACTION W/PHACO Right 10/19/2016   Procedure: CATARACT EXTRACTION PHACO AND INTRAOCULAR LENS PLACEMENT (IOC);  Surgeon: Nevada Crane, MD;  Location: [facility];  Service: Ophthalmology;  Laterality: Right;  casette lot # [id number] H [place]  00:59.5 AP%   16.5 CDE   9.79    COLONOSCOPY  06/2012   normal (in [place])   EYE SURGERY     TONSILLECTOMY     TUBAL LIGATION      Current Medications: Current Meds  Medication Sig   Acetaminophen (TYLENOL ARTHRITIS PAIN PO) Take 1 capsule by mouth 2 (two) times daily.   aspirin 81 MG chewable tablet Chew by mouth daily.   Bioflavonoids (BIOFLAVONOID-1000) 1000 MG TABS Take by mouth.   cetirizine (ZYRTEC) 10 MG tablet Take 10 mg by mouth as needed.    Dulaglutide 4.5 MG/0.5ML SOPN Inject into the skin once a week. SOLUM   glucose blood test strip    lisinopril-hydrochlorothiazide (ZESTORETIC) 20-12.5 MG tablet TAKE 1 TABLET DAILY   metFORMIN (GLUCOPHAGE) 1000 MG tablet TAKE 1 TABLET TWICE A DAY (Patient taking differently: Take 1,000 mg by mouth 2 (two) times daily. SOLUM)   Multiple Vitamins-Minerals (PRESERVISION AREDS PO) Take by mouth.   naproxen sodium (ANAPROX)  220 MG tablet Take 220 mg by mouth 2 (two) times daily with a meal. PRN   NON FORMULARY CBD ointment   Pyridoxine HCl (VITAMIN B6) 200 MG TABS Take 100 mg by mouth 2 (two) times daily.   simvastatin (ZOCOR) 20 MG tablet TAKE 1 TABLET DAILY   Turmeric (QC TUMERIC COMPLEX PO) Take by mouth.     Allergies:   Glipizide, Latex, and Codeine   Social History   Socioeconomic History   Marital status: Widowed    Spouse name: Not on file   Number of children: 1   Years of education: Not on file   Highest education level: 12th grade  Occupational History   Occupation: Retired  Tobacco Use   Smoking status: Never   Smokeless tobacco: Never   Tobacco comments:    smoking cessation materials not required  Vaping Use    Vaping Use: Never used  Substance and Sexual Activity   Alcohol use: Not Currently    Alcohol/week: 1.0 standard drink of alcohol    Types: 1 Glasses of wine per week    Comment: once monthly maybe   Drug use: No   Sexual activity: Not Currently  Other Topics Concern   Not on file  Social History Narrative   Pt lives alone   Social Determinants of Health   Financial Resource Strain: Low Risk  (07/11/2021)   Overall Financial Resource Strain (CARDIA)    Difficulty of Paying Living Expenses: Not hard at all  Food Insecurity: No Food Insecurity (07/11/2021)   Hunger Vital Sign    Worried About Running Out of Food in the Last Year: Never true    Ran Out of Food in the Last Year: Never true  Transportation Needs: No Transportation Needs (07/11/2021)   PRAPARE - Administrator, Civil Service (Medical): No    Lack of Transportation (Non-Medical): No  Physical Activity: Inactive (07/11/2021)   Exercise Vital Sign    Days of Exercise per Week: 0 days    Minutes of Exercise per Session: 0 min  Stress: No Stress Concern Present (07/11/2021)   [employer] of Occupational Health - Occupational Stress Questionnaire    Feeling of Stress : Only a little  Social Connections: Socially Isolated (07/11/2021)   Social Connection and Isolation Panel [NHANES]    Frequency of Communication with Friends and Family: More than three times a week    Frequency of Social Gatherings with Friends and Family: More than three times a week    Attends Religious Services: Never    Database administrator or Organizations: No    Attends Banker Meetings: Never    Marital Status: Widowed     Family History: The patient's family history includes Alzheimer's disease in her mother; Arthritis in her mother; Breast cancer in her maternal grandmother; CAD in her father; Diabetes in her maternal aunt; Heart attack in her child; Kidney disease in her father and maternal aunt; Obesity in her maternal  grandmother; Stroke in her father.  ROS:   Please see the history of present illness.     All other systems reviewed and are negative.  EKGs/Labs/Other Studies Reviewed:    The following studies were reviewed today:   EKG:  EKG is  ordered today.  The ekg ordered today demonstrates normal sinus rhythm, with occasional PVCs.  Recent Labs: 07/26/2021: ALT 26; Hemoglobin 14.1; Platelets 237; TSH 1.110 04/14/2022: BUN 18; Potassium 4.3; Sodium 140 06/26/2022: Creatinine, Ser 0.70  Recent  Lipid Panel    Component Value Date/Time   CHOL 139 01/18/2022 0000   CHOL 163 07/26/2021 1129   TRIG 221 (A) 01/18/2022 0000   HDL 46 01/18/2022 0000   HDL 54 07/26/2021 1129   CHOLHDL 3.0 07/26/2021 1129   LDLCALC 48 01/18/2022 0000   LDLCALC 80 07/26/2021 1129     Risk Assessment/Calculations:             Physical Exam:    VS:  BP (!) 104/56 (BP Location: Left Arm, Patient Position: Sitting, Cuff Size: Large)   Pulse 88   Ht 5\' 3"  (1.6 m)   Wt 218 lb (98.9 kg)   SpO2 98%   BMI 38.62 kg/m     Wt Readings from Last 3 Encounters:  06/28/22 218 lb (98.9 kg)  05/17/22 219 lb 9.6 oz (99.6 kg)  04/05/22 218 lb 12.8 oz (99.2 kg)     GEN:  Well nourished, well developed in no acute distress HEENT: Normal NECK: No JVD; No carotid bruits CARDIAC: RRR, no murmurs, rubs, gallops RESPIRATORY:  Clear to auscultation without rales, wheezing or rhonchi  ABDOMEN: Soft, non-tender, non-distended MUSCULOSKELETAL:  No edema; No deformity  SKIN: Warm and dry NEUROLOGIC:  Alert and oriented x 3 PSYCHIATRIC:  Normal affect   ASSESSMENT:    1. Precordial pain   2. Primary hypertension   3. Pure hypercholesterolemia    PLAN:    In order of problems listed above:  Chest pain, echo with normal EF, coronary CT with no angiographic evidence of CAD, calcium score 21.3.  Findings to suggest etiology of chest pain.  Patient made aware of results, reassured.  Continue aspirin,  simvastatin. Hypertension, BP low normal.  Continue Zestoretic 20-12.5 mg daily.  BP becomes low, plan to reduce BP med. Hyperlipidemia, LDL at goal, continue simvastatin 20 mg daily.  Follow-up in 12 months.     Medication Adjustments/Labs and Tests Ordered: Current medicines are reviewed at length with the patient today.  Concerns regarding medicines are outlined above.  Orders Placed This Encounter  Procedures   EKG 12-Lead   No orders of the defined types were placed in this encounter.   Patient Instructions  Medication Instructions:   Your physician recommends that you continue on your current medications as directed. Please refer to the Current Medication list given to you today.  *If you need a refill on your cardiac medications before your next appointment, please call your pharmacy*   Lab Work:  None Ordered  If you have labs (blood work) drawn today and your tests are completely normal, you will receive your results only by: MyChart Message (if you have MyChart) OR A paper copy in the mail If you have any lab test that is abnormal or we need to change your treatment, we will call you to review the results.   Testing/Procedures:  None Ordered    Follow-Up: At [facility], you and your health needs are our priority.  As part of our continuing mission to provide you with exceptional heart care, we have created designated Provider Care Teams.  These Care Teams include your primary Cardiologist (physician) and Advanced Practice Providers (APPs -  Physician Assistants and Nurse Practitioners) who all work together to provide you with the care you need, when you need it.  We recommend signing up for the patient portal called "MyChart".  Sign up information is provided on this After Visit Summary.  MyChart is used to connect with patients for Virtual  Visits (Telemedicine).  Patients are able to view lab/test results, encounter notes, upcoming appointments, etc.   Non-urgent messages can be sent to your provider as well.   To learn more about what you can do with MyChart, go to [email or web address].    Your next appointment:   12 month(s)  Provider:   You may see Debbe Odea, MD or one of the following Advanced Practice Providers on your designated Care Team:   Nicolasa Ducking, NP Eula Listen, PA-C Cadence Fransico Michael, PA-C Charlsie Quest, NP    Signed, Debbe Odea, MD  06/28/2022 12:18 PM     [facility]

## 2022-07-12 ENCOUNTER — Ambulatory Visit: Payer: Medicare Other

## 2022-07-15 ENCOUNTER — Other Ambulatory Visit: Payer: Self-pay | Admitting: Family Medicine

## 2022-07-15 ENCOUNTER — Telehealth: Payer: Medicare Other

## 2022-07-15 ENCOUNTER — Telehealth: Payer: Self-pay | Admitting: Family Medicine

## 2022-07-15 DIAGNOSIS — U071 COVID-19: Secondary | ICD-10-CM

## 2022-07-15 MED ORDER — NIRMATRELVIR/RITONAVIR (PAXLOVID)TABLET
3.00 | ORAL_TABLET | Freq: Two times a day (BID) | ORAL | 0 refills | Status: AC
Start: 2022-07-15 — End: 2022-07-20

## 2022-07-15 NOTE — Telephone Encounter (Signed)
Received On Call Nurse triage call for this patient, Cellestine Saman 01/01/1946, PCP Dr Bari Edward  She reported symptoms started within past 1-2 days, tested positive for COVID, initial nurse triage advised her to go to ED due to dyspnea and difficulty with feeling "swollen throat", however patient corrected this and said she can catch her breath and only sore throat, not throat swelling. She declined ED and requested rx Paxlovid. GFR >60 recent Creatinine, she will be advised to pause Simvastatin while on Paxlovid. Rx sent to [facility] [place].   Route note to PCP  Saralyn Pilar, DO [facility] 07/15/2022, 12:14 PM

## 2022-07-20 ENCOUNTER — Encounter: Payer: Medicare Other | Admitting: Internal Medicine

## 2022-07-25 ENCOUNTER — Ambulatory Visit (INDEPENDENT_AMBULATORY_CARE_PROVIDER_SITE_OTHER): Payer: Medicare Other | Admitting: Internal Medicine

## 2022-07-25 ENCOUNTER — Encounter: Payer: Self-pay | Admitting: Internal Medicine

## 2022-07-25 VITALS — BP 106/72 | HR 95 | Ht 63.0 in | Wt 211.6 lb

## 2022-07-25 DIAGNOSIS — Z1231 Encounter for screening mammogram for malignant neoplasm of breast: Secondary | ICD-10-CM | POA: Diagnosis not present

## 2022-07-25 DIAGNOSIS — E118 Type 2 diabetes mellitus with unspecified complications: Secondary | ICD-10-CM | POA: Diagnosis not present

## 2022-07-25 DIAGNOSIS — Z7984 Long term (current) use of oral hypoglycemic drugs: Secondary | ICD-10-CM

## 2022-07-25 DIAGNOSIS — E1169 Type 2 diabetes mellitus with other specified complication: Secondary | ICD-10-CM | POA: Diagnosis not present

## 2022-07-25 DIAGNOSIS — E785 Hyperlipidemia, unspecified: Secondary | ICD-10-CM

## 2022-07-25 DIAGNOSIS — E559 Vitamin D deficiency, unspecified: Secondary | ICD-10-CM | POA: Diagnosis not present

## 2022-07-25 DIAGNOSIS — I10 Essential (primary) hypertension: Secondary | ICD-10-CM

## 2022-07-25 NOTE — Patient Instructions (Signed)
Call [facility] to schedule your mammogram at [phone].  

## 2022-07-25 NOTE — Assessment & Plan Note (Signed)
 Normal exam with stable BP on lisinopril hct. No concerns or side effects to current medication. No change in regimen; continue low sodium diet.

## 2022-07-25 NOTE — Assessment & Plan Note (Addendum)
Followed by Endo Dr Tedd Sias On Trulicity 4.5 mg and Metformin and Jardiance started 2 mo ago.  Out of Trulicity after the next dose. Lab Results  Component Value Date   HGBA1C 7.6 05/30/2022

## 2022-07-25 NOTE — Progress Notes (Signed)
Date:  07/25/2022   Name:  Nancy Chen   DOB:  [birth year]   MRN:  [id number]   Chief Complaint: Annual Exam Nancy Chen is a 77 y.o. female who presents today for her Complete Annual Exam. She feels well. She reports exercising/ walking. She reports she is sleeping fairly well. Breast complaints - none.  She is recovering from Covid - still has mild nausea and stomach upset but otherwise doing well.  Mammogram: due 7/24 DEXA: done Colonoscopy: 06/2012 - due - aged out  Health Maintenance Due  Topic Date Due   COVID-19 Vaccine (6 - 2023-24 season) 09/09/2021   DTaP/Tdap/Td (2 - Td or Tdap) 03/10/2022   Colonoscopy  06/24/2022   Medicare Annual Wellness (AWV)  07/12/2022    Immunization History  Administered Date(s) Administered   Hepatitis B 07/04/2011   Hepatitis B, ADULT 07/04/2011   Influenza, High Dose Seasonal PF 10/12/2016, 10/10/2017, 11/17/2020   Influenza,inj,Quad PF,6+ Mos 09/29/2013, 09/29/2014, 10/11/2015   Influenza-Unspecified 10/10/2018, 10/13/2019, 10/07/2021   PFIZER Comirnaty(Gray Top)Covid-19 Tri-Sucrose Vaccine 09/07/2020   PFIZER(Purple Top)SARS-COV-2 Vaccination 03/10/2019, 03/30/2019, 01/05/2020   Pfizer Covid-19 Vaccine Bivalent Booster 69yrs & up 01/06/2021   Pneumococcal Conjugate-13 02/11/2015   Pneumococcal Polysaccharide-23 01/10/2005, 06/26/2016   Tdap 03/09/2012   Zoster Recombinant(Shingrix) 07/09/2017, 09/25/2017     Hypertension Pertinent negatives include no chest pain, headaches, palpitations or shortness of breath.  Hyperlipidemia Pertinent negatives include no chest pain or shortness of breath.  Diabetes She presents for her follow-up diabetic visit. She has type 2 diabetes mellitus. Her disease course has been stable. Pertinent negatives for hypoglycemia include no dizziness, headaches, nervousness/anxiousness or tremors. Pertinent negatives for diabetes include no chest pain, no fatigue, no polydipsia and no polyuria.     Lab Results  Component Value Date   NA 140 04/14/2022   K 4.3 04/14/2022   CO2 24 04/14/2022   GLUCOSE 155 (H) 04/14/2022   BUN 18 04/14/2022   CREATININE 0.70 06/26/2022   CALCIUM 10.8 (H) 04/14/2022   EGFR 91 04/14/2022   GFRNONAA 80 11/06/2019   Lab Results  Component Value Date   CHOL 139 01/18/2022   HDL 46 01/18/2022   LDLCALC 48 01/18/2022   TRIG 221 (A) 01/18/2022   CHOLHDL 3.0 07/26/2021   Lab Results  Component Value Date   TSH 1.110 07/26/2021   Lab Results  Component Value Date   HGBA1C 7.6 05/30/2022   Lab Results  Component Value Date   WBC 5.3 07/26/2021   HGB 14.1 07/26/2021   HCT 42.7 07/26/2021   MCV 89 07/26/2021   PLT 237 07/26/2021   Lab Results  Component Value Date   ALT 26 07/26/2021   AST 25 07/26/2021   ALKPHOS 63 07/26/2021   BILITOT 0.5 07/26/2021   No results found for: "25OHVITD2", "25OHVITD3", "VD25OH"   Review of Systems  Constitutional:  Negative for chills, fatigue and fever.  HENT:  Negative for congestion, hearing loss, tinnitus, trouble swallowing and voice change.   Eyes:  Negative for visual disturbance.  Respiratory:  Negative for cough, chest tightness, shortness of breath and wheezing.   Cardiovascular:  Negative for chest pain, palpitations and leg swelling.  Gastrointestinal:  Positive for nausea. Negative for abdominal pain, constipation, diarrhea and vomiting.  Endocrine: Negative for polydipsia and polyuria.  Genitourinary:  Negative for dysuria, frequency, genital sores, vaginal bleeding and vaginal discharge.  Musculoskeletal:  Negative for arthralgias, gait problem and joint swelling.  Skin:  Negative for color  change and rash.  Neurological:  Negative for dizziness, tremors, light-headedness and headaches.  Hematological:  Negative for adenopathy. Does not bruise/bleed easily.  Psychiatric/Behavioral:  Negative for dysphoric mood and sleep disturbance. The patient is not nervous/anxious.     Patient  Active Problem List   Diagnosis Date Noted   Vitamin D deficiency 07/25/2022   Heart palpitations 02/15/2022   Aortic atherosclerosis (HCC) 07/15/2019   BMI 40.0-44.9, adult (HCC) 03/12/2019   Facial basal cell cancer 10/31/2016   Migraine 10/31/2016   Lipoma of left upper extremity 10/11/2015   Type II diabetes mellitus with complication (HCC) 02/11/2015   Ovarian failure 11/10/2014   Essential (primary) hypertension 07/15/2014   Hyperlipidemia associated with type 2 diabetes mellitus (HCC) 07/15/2014   Tricompartment osteoarthritis of knee 07/15/2014    Allergies  Allergen Reactions   Glipizide Swelling    hives   Latex    Codeine Hives    Past Surgical History:  Procedure Laterality Date   ABDOMINAL HYSTERECTOMY     BASAL CELL CARCINOMA EXCISION     CARPAL TUNNEL RELEASE Bilateral    CARPAL TUNNEL RELEASE     CATARACT EXTRACTION W/PHACO Left 09/07/2016   Procedure: CATARACT EXTRACTION PHACO AND INTRAOCULAR LENS PLACEMENT (IOC);  Surgeon: Nevada Crane, MD;  Location: [facility];  Service: Ophthalmology;  Laterality: Left;  [place] 00:41.9 AP% 14.9 CDE 6.23 Fluid pack lot # [id number]   CATARACT EXTRACTION W/PHACO Right 10/19/2016   Procedure: CATARACT EXTRACTION PHACO AND INTRAOCULAR LENS PLACEMENT (IOC);  Surgeon: Nevada Crane, MD;  Location: [facility];  Service: Ophthalmology;  Laterality: Right;  casette lot # [id number] H [place]  00:59.5 AP%   16.5 CDE   9.79    COLONOSCOPY  06/2012   normal (in [place])   EYE SURGERY     TONSILLECTOMY     TUBAL LIGATION      Social History   Tobacco Use   Smoking status: Never   Smokeless tobacco: Never   Tobacco comments:    smoking cessation materials not required  Vaping Use   Vaping status: Never Used  Substance Use Topics   Alcohol use: Not Currently    Alcohol/week: 1.0 standard drink of alcohol    Types: 1 Glasses of wine per week    Comment: once monthly maybe   Drug use: No     Medication list has been reviewed  and updated.  Current Meds  Medication Sig   Acetaminophen (TYLENOL ARTHRITIS PAIN PO) Take 1 capsule by mouth 2 (two) times daily.   aspirin 81 MG chewable tablet Chew by mouth daily.   Bioflavonoids (BIOFLAVONOID-1000) 1000 MG TABS Take by mouth.   cetirizine (ZYRTEC) 10 MG tablet Take 10 mg by mouth as needed.    Dulaglutide 4.5 MG/0.5ML SOPN Inject into the skin once a week. SOLUM   empagliflozin (JARDIANCE) 25 MG TABS tablet Take 25 mg by mouth daily.   glucose blood test strip    lisinopril-hydrochlorothiazide (ZESTORETIC) 20-12.5 MG tablet TAKE 1 TABLET DAILY   metFORMIN (GLUCOPHAGE) 1000 MG tablet TAKE 1 TABLET TWICE A DAY (Patient taking differently: Take 1,000 mg by mouth 2 (two) times daily. SOLUM)   naproxen sodium (ANAPROX) 220 MG tablet Take 220 mg by mouth 2 (two) times daily with a meal. PRN   NON FORMULARY CBD ointment   Pyridoxine HCl (VITAMIN B6) 200 MG TABS Take 100 mg by mouth 2 (two) times daily.   simvastatin (ZOCOR) 20 MG tablet TAKE 1 TABLET DAILY  Turmeric (QC TUMERIC COMPLEX PO) Take by mouth.   [DISCONTINUED] Multiple Vitamins-Minerals (PRESERVISION AREDS PO) Take by mouth.       07/25/2022    9:55 AM 02/15/2022    2:54 PM 12/22/2021    8:58 AM 07/26/2021   10:50 AM  GAD 7 : Generalized Anxiety Score  Nervous, Anxious, on Edge 0 1 0 0  Control/stop worrying 0 1 0 0  Worry too much - different things 0 1 0 0  Trouble relaxing 0 0 0 0  Restless 0 0 0 0  Easily annoyed or irritable 0 0 0 0  Afraid - awful might happen 0 0 0 0  Total GAD 7 Score 0 3 0 0  Anxiety Difficulty Not difficult at all Not difficult at all Not difficult at all Not difficult at all       07/25/2022    9:55 AM 02/15/2022    2:53 PM 12/22/2021    8:58 AM  Depression screen PHQ 2/9  Decreased Interest 0 0 0  Down, Depressed, Hopeless 0 0 0  PHQ - 2 Score 0 0 0  Altered sleeping 1 3 0  Tired, decreased energy 1 3 0  Change in appetite 1 0 0  Feeling bad or failure about yourself   0 0 0  Trouble concentrating 0 0 0  Moving slowly or fidgety/restless 0 0 0  Suicidal thoughts 0 0 0  PHQ-9 Score 3 6 0  Difficult doing work/chores Not difficult at all Not difficult at all Not difficult at all    BP Readings from Last 3 Encounters:  07/25/22 106/72  06/28/22 (!) 104/56  06/26/22 (!) 106/58    Physical Exam Vitals and nursing note reviewed.  Constitutional:      General: She is not in acute distress.    Appearance: Normal appearance. She is well-developed.  HENT:     Head: Normocephalic and atraumatic.     Right Ear: Tympanic membrane and ear canal normal.     Left Ear: Tympanic membrane and ear canal normal.     Nose:     Right Sinus: No maxillary sinus tenderness.     Left Sinus: No maxillary sinus tenderness.  Eyes:     General: No scleral icterus.       Right eye: No discharge.        Left eye: No discharge.     Conjunctiva/sclera: Conjunctivae normal.  Neck:     Thyroid: No thyromegaly.     Vascular: No carotid bruit.  Cardiovascular:     Rate and Rhythm: Normal rate and regular rhythm.     Pulses: Normal pulses.     Heart sounds: Normal heart sounds.  Pulmonary:     Effort: Pulmonary effort is normal. No respiratory distress.     Breath sounds: No wheezing.  Abdominal:     General: Bowel sounds are normal.     Palpations: Abdomen is soft.     Tenderness: There is abdominal tenderness (mild epigastric tenderness).  Musculoskeletal:     Cervical back: Normal range of motion. No erythema.     Right lower leg: No edema.     Left lower leg: No edema.  Lymphadenopathy:     Cervical: No cervical adenopathy.  Skin:    General: Skin is warm and dry.     Findings: No rash.  Neurological:     Mental Status: She is alert and oriented to person, place, and time.     Cranial Nerves:  No cranial nerve deficit.     Sensory: No sensory deficit.     Deep Tendon Reflexes: Reflexes are normal and symmetric.  Psychiatric:        Attention and Perception:  Attention normal.        Mood and Affect: Mood normal.     Wt Readings from Last 3 Encounters:  07/25/22 211 lb 9.6 oz (96 kg)  06/28/22 218 lb (98.9 kg)  05/17/22 219 lb 9.6 oz (99.6 kg)    BP 106/72   Pulse 95   Ht 5\' 3"  (1.6 m)   Wt 211 lb 9.6 oz (96 kg)   SpO2 94%   BMI 37.48 kg/m   Assessment and Plan:  Problem List Items Addressed This Visit     Vitamin D deficiency   Relevant Orders   VITAMIN D 25 Hydroxy (Vit-D Deficiency, Fractures)   Type II diabetes mellitus with complication (HCC) (Chronic)    Followed by Endo Dr Tedd Sias On Trulicity 4.5 mg and Metformin and Jardiance started 2 mo ago.  Out of Trulicity after the next dose. Lab Results  Component Value Date   HGBA1C 7.6 05/30/2022         Relevant Medications   empagliflozin (JARDIANCE) 25 MG TABS tablet   Other Relevant Orders   Comprehensive metabolic panel   Hyperlipidemia associated with type 2 diabetes mellitus (HCC) (Chronic)    LDL is  Lab Results  Component Value Date   LDLCALC 48 01/18/2022   Currently being treated with simvastatin with good compliance and no concerns.       Relevant Medications   empagliflozin (JARDIANCE) 25 MG TABS tablet   Other Relevant Orders   Lipid panel   Essential (primary) hypertension - Primary (Chronic)    Normal exam with stable BP on lisinopril hct. No concerns or side effects to current medication. No change in regimen; continue low sodium diet.       Relevant Orders   CBC with Differential/Platelet   Comprehensive metabolic panel   TSH   Other Visit Diagnoses     Encounter for screening mammogram for breast cancer       schedule at [facility]   Relevant Orders   MM 3D SCREENING MAMMOGRAM BILATERAL BREAST   Long term current use of oral hypoglycemic drug           Return in about 6 months (around 01/25/2023) for HTN.    Reubin Milan, MD [facility] [place]

## 2022-07-25 NOTE — Assessment & Plan Note (Signed)
LDL is  Lab Results  Component Value Date   LDLCALC 48 01/18/2022   Currently being treated with simvastatin with good compliance and no concerns.

## 2022-07-26 ENCOUNTER — Ambulatory Visit: Payer: Medicare Other

## 2022-07-26 VITALS — Ht 63.0 in | Wt 211.0 lb

## 2022-07-26 DIAGNOSIS — Z0000 Encounter for general adult medical examination without abnormal findings: Secondary | ICD-10-CM | POA: Diagnosis not present

## 2022-07-26 LAB — CBC WITH DIFFERENTIAL/PLATELET
Basophils Absolute: 0 10*3/uL (ref 0.0–0.2)
Basos: 1 %
EOS (ABSOLUTE): 0.1 10*3/uL (ref 0.0–0.4)
Eos: 2 %
Hematocrit: 43.1 % (ref 34.0–46.6)
Hemoglobin: 14.2 g/dL (ref 11.1–15.9)
Immature Grans (Abs): 0 10*3/uL (ref 0.0–0.1)
Immature Granulocytes: 1 %
Lymphocytes Absolute: 2.3 10*3/uL (ref 0.7–3.1)
Lymphs: 37 %
MCH: 29.7 pg (ref 26.6–33.0)
MCHC: 32.9 g/dL (ref 31.5–35.7)
MCV: 90 fL (ref 79–97)
Monocytes Absolute: 0.7 10*3/uL (ref 0.1–0.9)
Monocytes: 11 %
Neutrophils Absolute: 3 10*3/uL (ref 1.4–7.0)
Neutrophils: 48 %
Platelets: 264 10*3/uL (ref 150–450)
RBC: 4.78 x10E6/uL (ref 3.77–5.28)
RDW: 13 % (ref 11.7–15.4)
WBC: 6.1 10*3/uL (ref 3.4–10.8)

## 2022-07-26 LAB — LIPID PANEL
Chol/HDL Ratio: 4.1 ratio (ref 0.0–4.4)
Cholesterol, Total: 178 mg/dL (ref 100–199)
HDL: 43 mg/dL (ref >39)
LDL Chol Calc (NIH): 88 mg/dL (ref 0–99)
Triglycerides: 282 mg/dL — ABNORMAL HIGH (ref 0–149)
VLDL Cholesterol Cal: 47 mg/dL — ABNORMAL HIGH (ref 5–40)

## 2022-07-26 LAB — COMPREHENSIVE METABOLIC PANEL
ALT: 29 IU/L (ref 0–32)
AST: 28 IU/L (ref 0–40)
Albumin: 4.8 g/dL (ref 3.8–4.8)
Alkaline Phosphatase: 55 IU/L (ref 44–121)
BUN/Creatinine Ratio: 24 (ref 12–28)
BUN: 22 mg/dL (ref 8–27)
Bilirubin Total: 0.4 mg/dL (ref 0.0–1.2)
CO2: 22 mmol/L (ref 20–29)
Calcium: 10.5 mg/dL — ABNORMAL HIGH (ref 8.7–10.3)
Chloride: 97 mmol/L (ref 96–106)
Creatinine, Ser: 0.92 mg/dL (ref 0.57–1.00)
Globulin, Total: 2.1 g/dL (ref 1.5–4.5)
Glucose: 142 mg/dL — ABNORMAL HIGH (ref 70–99)
Potassium: 4.3 mmol/L (ref 3.5–5.2)
Sodium: 140 mmol/L (ref 134–144)
Total Protein: 6.9 g/dL (ref 6.0–8.5)
eGFR: 65 mL/min/{1.73_m2} (ref >59)

## 2022-07-26 LAB — VITAMIN D 25 HYDROXY (VIT D DEFICIENCY, FRACTURES): Vit D, 25-Hydroxy: 19.3 ng/mL — ABNORMAL LOW (ref 30.0–100.0)

## 2022-07-26 LAB — TSH: TSH: 1.41 u[IU]/mL (ref 0.450–4.500)

## 2022-07-26 NOTE — Progress Notes (Signed)
Subjective:   Nancy Chen is a 77 y.o. female who presents for Medicare Annual (Subsequent) preventive examination.  Per patient no change in vitals since last visit, unable to obtain new vitals due to telehealth visit   Visit Complete: Virtual  I connected with  [name] on 07/26/22 by a audio enabled telemedicine application and verified that I am speaking with the correct person using two identifiers.  Patient Location: Home  Provider Location: Office/Clinic  I discussed the limitations of evaluation and management by telemedicine. The patient expressed understanding and agreed to proceed.  Review of Systems     Cardiac Risk Factors include: advanced age (>63men, >27 women);diabetes mellitus;dyslipidemia;hypertension;sedentary lifestyle;obesity (BMI >30kg/m2)     Objective:    Today's Vitals   07/26/22 1133 07/26/22 1151  Weight:  211 lb (95.7 kg)  Height:  5\' 3"  (1.6 m)  PainSc: 0-No pain    Body mass index is 37.38 kg/m.     07/26/2022   11:39 AM 07/11/2021   10:01 AM 07/07/2020   10:17 AM 07/07/2019   10:24 AM 07/03/2018   11:03 AM 07/02/2017   11:26 AM 09/07/2016    6:44 AM  Advanced Directives  Does Patient Have a Medical Advance Directive? No Yes Yes Yes Yes Yes Yes  Type of Furniture conservator/restorer;Living will Healthcare Power of [place];Living will Healthcare Power of [place];Living will Healthcare Power of [place];Living will Healthcare Power of [place];Living will Living will;Healthcare Power of Attorney  Does patient want to make changes to medical advance directive?       No - Patient declined  Copy of Healthcare Power of Attorney in Chart?   Yes - validated most recent copy scanned in chart (See row information) Yes - validated most recent copy scanned in chart (See row information) Yes - validated most recent copy scanned in chart (See row information) Yes No - copy requested  Would patient like information on creating  a medical advance directive? No - Patient declined          Current Medications (verified) Outpatient Encounter Medications as of 07/26/2022  Medication Sig   Acetaminophen (TYLENOL ARTHRITIS PAIN PO) Take 1 capsule by mouth 2 (two) times daily.   aspirin 81 MG chewable tablet Chew by mouth daily.   cetirizine (ZYRTEC) 10 MG tablet Take 10 mg by mouth as needed.    Dulaglutide 4.5 MG/0.5ML SOPN Inject into the skin once a week. SOLUM   empagliflozin (JARDIANCE) 25 MG TABS tablet Take 25 mg by mouth daily.   glucose blood test strip    lisinopril-hydrochlorothiazide (ZESTORETIC) 20-12.5 MG tablet TAKE 1 TABLET DAILY   metFORMIN (GLUCOPHAGE) 1000 MG tablet TAKE 1 TABLET TWICE A DAY (Patient taking differently: Take 1,000 mg by mouth 2 (two) times daily. SOLUM)   naproxen sodium (ANAPROX) 220 MG tablet Take 220 mg by mouth 2 (two) times daily with a meal. PRN   NON FORMULARY CBD ointment   Pyridoxine HCl (VITAMIN B6) 200 MG TABS Take 100 mg by mouth 2 (two) times daily.   simvastatin (ZOCOR) 20 MG tablet TAKE 1 TABLET DAILY   Turmeric (QC TUMERIC COMPLEX PO) Take by mouth.   Bioflavonoids (BIOFLAVONOID-1000) 1000 MG TABS Take by mouth. (Patient not taking: Reported on 07/26/2022)   No facility-administered encounter medications on file as of 07/26/2022.    Allergies (verified) Glipizide, Latex, and Codeine   History: Past Medical History:  Diagnosis Date   Allergy    Anemia  Came from menstrual issues   Anxiety    Arthritis    Bronchitis    Cataract 2018   COPD (chronic obstructive pulmonary disease) (HCC)    Diabetes mellitus without complication (HCC)    Facial basal cell cancer 10/31/2016   also left lower eyelid, chest and shoulder   Hypercholesteremia    Hypertension    Long-term use of high-risk medication 11/10/2014   Actos started 11/2014    Lower extremity edema    Past Surgical History:  Procedure Laterality Date   ABDOMINAL HYSTERECTOMY     BASAL CELL  CARCINOMA EXCISION     CARPAL TUNNEL RELEASE Bilateral    CARPAL TUNNEL RELEASE     CATARACT EXTRACTION W/PHACO Left 09/07/2016   Procedure: CATARACT EXTRACTION PHACO AND INTRAOCULAR LENS PLACEMENT (IOC);  Surgeon: Nevada Crane, MD;  Location: [facility] ORS;  Service: Ophthalmology;  Laterality: Left;  [place] 00:41.9 AP% 14.9 CDE 6.23 Fluid pack lot # [id number]   CATARACT EXTRACTION W/PHACO Right 10/19/2016   Procedure: CATARACT EXTRACTION PHACO AND INTRAOCULAR LENS PLACEMENT (IOC);  Surgeon: Nevada Crane, MD;  Location: [facility] ORS;  Service: Ophthalmology;  Laterality: Right;  casette lot # [id number] [place]  00:59.5 AP%   16.5 CDE   9.79    COLONOSCOPY  06/2012   normal (in [place])   EYE SURGERY     TONSILLECTOMY     TUBAL LIGATION     Family History  Problem Relation Age of Onset   Alzheimer's disease Mother    Arthritis Mother    CAD Father    Kidney disease Father    Stroke Father    Breast cancer Maternal Grandmother        great gm   Obesity Maternal Grandmother    Diabetes Maternal Aunt    Kidney disease Maternal Aunt    Heart attack Child    Social History   Socioeconomic History   Marital status: Widowed    Spouse name: Not on file   Number of children: 1   Years of education: Not on file   Highest education level: 12th grade  Occupational History   Occupation: Retired  Tobacco Use   Smoking status: Never   Smokeless tobacco: Never   Tobacco comments:    smoking cessation materials not required  Vaping Use   Vaping status: Never Used  Substance and Sexual Activity   Alcohol use: Not Currently    Alcohol/week: 1.0 standard drink of alcohol    Types: 1 Glasses of wine per week    Comment: once monthly maybe   Drug use: No   Sexual activity: Not Currently  Other Topics Concern   Not on file  Social History Narrative   Pt lives alone   Social Determinants of Health   Financial Resource Strain: Low Risk  (07/26/2022)   Overall Financial Resource Strain  (CARDIA)    Difficulty of Paying Living Expenses: Not hard at all  Food Insecurity: No Food Insecurity (07/26/2022)   Hunger Vital Sign    Worried About Running Out of Food in the Last Year: Never true    Ran Out of Food in the Last Year: Never true  Transportation Needs: No Transportation Needs (07/26/2022)   PRAPARE - Administrator, Civil Service (Medical): No    Lack of Transportation (Non-Medical): No  Physical Activity: Insufficiently Active (07/26/2022)   Exercise Vital Sign    Days of Exercise per Week: 3 days    Minutes of Exercise per  Session: 30 min  Stress: No Stress Concern Present (07/26/2022)   [employer] of Occupational Health - Occupational Stress Questionnaire    Feeling of Stress : Not at all  Social Connections: Socially Isolated (07/26/2022)   Social Connection and Isolation Panel [NHANES]    Frequency of Communication with Friends and Family: More than three times a week    Frequency of Social Gatherings with Friends and Family: Three times a week    Attends Religious Services: Never    Active Member of Clubs or Organizations: No    Attends Banker Meetings: Never    Marital Status: Widowed    Tobacco Counseling Counseling given: Not Answered Tobacco comments: smoking cessation materials not required   Clinical Intake:  Pre-visit preparation completed: Yes  Pain : No/denies pain Pain Score: 0-No pain     Nutritional Risks: None Diabetes: Yes CBG done?: No Did pt. bring in CBG monitor from home?: No  How often do you need to have someone help you when you read instructions, pamphlets, or other written materials from your doctor or pharmacy?: 1 - Never  Interpreter Needed?: No  Information entered by :: Kennedy Bucker, LPN   Activities of Daily Living    07/26/2022   11:40 AM 07/22/2022    5:01 PM  In your present state of health, do you have any difficulty performing the following activities:  Hearing? 0 0   Vision? 0 0  Difficulty concentrating or making decisions? 0 0  Walking or climbing stairs? 1 1  Comment knees   Dressing or bathing? 0 0  Doing errands, shopping? 0 0  Preparing Food and eating ? N N  Using the Toilet? N N  In the past six months, have you accidently leaked urine? N Y  Do you have problems with loss of bowel control? N N  Managing your Medications? N N  Managing your Finances? N N  Housekeeping or managing your Housekeeping? N N    Patient Care Team: Reubin Milan, MD as PCP - General (Internal Medicine) Debbe Odea, MD as PCP - Cardiology (Cardiology) Nevada Crane, MD as Consulting Physician (Ophthalmology) Jesusita Oka, MD as Consulting Physician (Dermatology) Tedd Sias Marlana Salvage, MD as Physician Assistant (Endocrinology)  Indicate any recent Medical Services you may have received from other than [facility] providers in the past year (date may be approximate).     Assessment:   This is a routine wellness examination for [place].  Hearing/Vision screen Hearing Screening - Comments:: No aids Vision Screening - Comments:: Wears glasses= Dr. Brooke Dare  Dietary issues and exercise activities discussed:     Goals Addressed             This Visit's Progress    DIET - EAT MORE FRUITS AND VEGETABLES         Depression Screen    07/26/2022   11:37 AM 07/25/2022    9:55 AM 02/15/2022    2:53 PM 12/22/2021    8:58 AM 07/26/2021   10:50 AM 07/11/2021   10:00 AM 11/16/2020   10:05 AM  PHQ 2/9 Scores  PHQ - 2 Score 0 0 0 0 0 0 0  PHQ- 9 Score 0 3 6 0 2  1    Fall Risk    07/26/2022   11:39 AM 07/25/2022    9:55 AM 07/22/2022    5:01 PM 07/08/2022    6:11 PM 02/15/2022    2:54 PM  Fall Risk   Falls in  the past year? 0 0 0 0 0  Number falls in past yr: 0 0 0 0 0  Injury with Fall? 0 0 0 0 0  Risk for fall due to : No Fall Risks No Fall Risks   No Fall Risks  Follow up Falls prevention discussed;Falls evaluation completed Falls evaluation  completed   Falls evaluation completed    MEDICARE RISK AT HOME:  Medicare Risk at Home - 07/26/22 1140     Any stairs in or around the home? Yes    If so, are there any without handrails? No    Home free of loose throw rugs in walkways, pet beds, electrical cords, etc? Yes    Adequate lighting in your home to reduce risk of falls? Yes    Life alert? No    Use of a cane, walker or w/c? No   walker   Grab bars in the bathroom? Yes    Shower chair or bench in shower? Yes    Elevated toilet seat or a handicapped toilet? Yes             TIMED UP AND GO:  Was the test performed?  No    Cognitive Function:        07/26/2022   11:41 AM 07/11/2021   10:07 AM 07/07/2019   10:29 AM 07/03/2018   11:09 AM 07/02/2017   11:31 AM  6CIT Screen  What Year? 0 points 0 points 0 points 0 points 0 points  What month? 0 points 0 points 0 points 0 points 0 points  What time? 0 points 0 points 0 points 0 points 0 points  Count back from 20 0 points 0 points 0 points 0 points 0 points  Months in reverse 0 points 0 points 0 points 0 points 0 points  Repeat phrase 0 points 2 points 0 points 0 points 0 points  Total Score 0 points 2 points 0 points 0 points 0 points    Immunizations Immunization History  Administered Date(s) Administered   Hepatitis B 07/04/2011   Hepatitis B, ADULT 07/04/2011   Influenza, High Dose Seasonal PF 10/12/2016, 10/10/2017, 11/17/2020   Influenza,inj,Quad PF,6+ Mos 09/29/2013, 09/29/2014, 10/11/2015   Influenza-Unspecified 10/10/2018, 10/13/2019, 10/07/2021   PFIZER Comirnaty(Gray Top)Covid-19 Tri-Sucrose Vaccine 09/07/2020   PFIZER(Purple Top)SARS-COV-2 Vaccination 03/10/2019, 03/30/2019, 01/05/2020   Pfizer Covid-19 Vaccine Bivalent Booster 80yrs & up 01/06/2021   Pneumococcal Conjugate-13 02/11/2015   Pneumococcal Polysaccharide-23 01/10/2005, 06/26/2016   Tdap 03/09/2012   Zoster Recombinant(Shingrix) 07/09/2017, 09/25/2017    TDAP status: Due, Education  has been provided regarding the importance of this vaccine. Advised may receive this vaccine at local pharmacy or Health Dept. Aware to provide a copy of the vaccination record if obtained from local pharmacy or Health Dept. Verbalized acceptance and understanding.  Flu Vaccine status: Up to date  Pneumococcal vaccine status: Up to date  Covid-19 vaccine status: Completed vaccines  Qualifies for Shingles Vaccine? Yes   Zostavax completed No   Shingrix Completed?: Yes  Screening Tests Health Maintenance  Topic Date Due   COVID-19 Vaccine (6 - 2023-24 season) 09/09/2021   DTaP/Tdap/Td (2 - Td or Tdap) 03/10/2022   Colonoscopy  06/24/2022   FOOT EXAM  07/27/2022   MAMMOGRAM  07/29/2022   INFLUENZA VACCINE  08/10/2022   HEMOGLOBIN A1C  11/30/2022   Diabetic kidney evaluation - Urine ACR  01/19/2023   OPHTHALMOLOGY EXAM  05/09/2023   Diabetic kidney evaluation - eGFR measurement  07/25/2023   Medicare  Annual Wellness (AWV)  07/26/2023   Pneumonia Vaccine 76+ Years old  Completed   DEXA SCAN  Completed   Hepatitis C Screening  Completed   Zoster Vaccines- Shingrix  Completed   HPV VACCINES  Aged Out    Health Maintenance  Health Maintenance Due  Topic Date Due   COVID-19 Vaccine (6 - 2023-24 season) 09/09/2021   DTaP/Tdap/Td (2 - Td or Tdap) 03/10/2022   Colonoscopy  06/24/2022   Declined referral for colonoscopy  Mammogram status: Completed SCHEDULED FOR 08/17/22. Repeat every year  Bone Density status: Completed 08/11/19. Results reflect: Bone density results: NORMAL. Repeat every 5 years.  Lung Cancer Screening: (Low Dose CT Chest recommended if Age 27-80 years, 20 pack-year currently smoking OR have quit w/in 15years.) does not qualify.    Additional Screening:  Hepatitis C Screening: does qualify; Completed 02/11/15  Vision Screening: Recommended annual ophthalmology exams for early detection of glaucoma and other disorders of the eye. Is the patient up to date with  their annual eye exam?  Yes  Who is the provider or what is the name of the office in which the patient attends annual eye exams? Dr.King If pt is not established with a provider, would they like to be referred to a provider to establish care? No .   Dental Screening: Recommended annual dental exams for proper oral hygiene  Diabetic Foot Exam: Diabetic Foot Exam: Completed 07/26/21  Community Resource Referral / Chronic Care Management: CRR required this visit?  No   CCM required this visit?  No     Plan:     I have personally reviewed and noted the following in the patient's chart:   Medical and social history Use of alcohol, tobacco or illicit drugs  Current medications and supplements including opioid prescriptions. Patient is not currently taking opioid prescriptions. Functional ability and status Nutritional status Physical activity Advanced directives List of other physicians Hospitalizations, surgeries, and ER visits in previous 12 months Vitals Screenings to include cognitive, depression, and falls Referrals and appointments  In addition, I have reviewed and discussed with patient certain preventive protocols, quality metrics, and best practice recommendations. A written personalized care plan for preventive services as well as general preventive health recommendations were provided to patient.     Hal Hope, LPN   06/06/4130   After Visit Summary: (MyChart) Due to this being a telephonic visit, the after visit summary with patients personalized plan was offered to patient via MyChart   Nurse Notes: none

## 2022-07-26 NOTE — Patient Instructions (Signed)
Nancy Chen , Thank you for taking time to come for your Medicare Wellness Visit. I appreciate your ongoing commitment to your health goals. Please review the following plan we discussed and let me know if I can assist you in the future.   These are the goals we discussed:  Goals      DIET - EAT MORE FRUITS AND VEGETABLES     DIET - INCREASE WATER INTAKE     Recommend to drink at least 6-8 8oz glasses of water per day.     Increase physical activity     Recommend increasing physical activity to at least 3 days per week         This is a list of the screening recommended for you and due dates:  Health Maintenance  Topic Date Due   COVID-19 Vaccine (6 - 2023-24 season) 09/09/2021   DTaP/Tdap/Td vaccine (2 - Td or Tdap) 03/10/2022   Colon Cancer Screening  06/24/2022   Complete foot exam   07/27/2022   Mammogram  07/29/2022   Flu Shot  08/10/2022   Hemoglobin A1C  11/30/2022   Yearly kidney health urinalysis for diabetes  01/19/2023   Eye exam for diabetics  05/09/2023   Yearly kidney function blood test for diabetes  07/25/2023   Medicare Annual Wellness Visit  07/26/2023   Pneumonia Vaccine  Completed   DEXA scan (bone density measurement)  Completed   Hepatitis C Screening  Completed   Zoster (Shingles) Vaccine  Completed   HPV Vaccine  Aged Out    Advanced directives: no  Conditions/risks identified: none  Next appointment: Follow up in one year for your annual wellness visit 08/01/23 @ 10:45 am by phone   Preventive Care 65 Years and Older, Female Preventive care refers to lifestyle choices and visits with your health care provider that can promote health and wellness. What does preventive care include? A yearly physical exam. This is also called an annual well check. Dental exams once or twice a year. Routine eye exams. Ask your health care provider how often you should have your eyes checked. Personal lifestyle choices, including: Daily care of your teeth and  gums. Regular physical activity. Eating a healthy diet. Avoiding tobacco and drug use. Limiting alcohol use. Practicing safe sex. Taking low-dose aspirin every day. Taking vitamin and mineral supplements as recommended by your health care provider. What happens during an annual well check? The services and screenings done by your health care provider during your annual well check will depend on your age, overall health, lifestyle risk factors, and family history of disease. Counseling  Your health care provider may ask you questions about your: Alcohol use. Tobacco use. Drug use. Emotional well-being. Home and relationship well-being. Sexual activity. Eating habits. History of falls. Memory and ability to understand (cognition). Work and work Astronomer. Reproductive health. Screening  You may have the following tests or measurements: Height, weight, and BMI. Blood pressure. Lipid and cholesterol levels. These may be checked every 5 years, or more frequently if you are over 71 years old. Skin check. Lung cancer screening. You may have this screening every year starting at age 38 if you have a 30-pack-year history of smoking and currently smoke or have quit within the past 15 years. Fecal occult blood test (FOBT) of the stool. You may have this test every year starting at age 49. Flexible sigmoidoscopy or colonoscopy. You may have a sigmoidoscopy every 5 years or a colonoscopy every 10 years starting at age  50. Hepatitis C blood test. Hepatitis B blood test. Sexually transmitted disease (STD) testing. Diabetes screening. This is done by checking your blood sugar (glucose) after you have not eaten for a while (fasting). You may have this done every 1-3 years. Bone density scan. This is done to screen for osteoporosis. You may have this done starting at age 78. Mammogram. This may be done every 1-2 years. Talk to your health care provider about how often you should have regular  mammograms. Talk with your health care provider about your test results, treatment options, and if necessary, the need for more tests. Vaccines  Your health care provider may recommend certain vaccines, such as: Influenza vaccine. This is recommended every year. Tetanus, diphtheria, and acellular pertussis (Tdap, Td) vaccine. You may need a Td booster every 10 years. Zoster vaccine. You may need this after age 68. Pneumococcal 13-valent conjugate (PCV13) vaccine. One dose is recommended after age 48. Pneumococcal polysaccharide (PPSV23) vaccine. One dose is recommended after age 29. Talk to your health care provider about which screenings and vaccines you need and how often you need them. This information is not intended to replace advice given to you by your health care provider. Make sure you discuss any questions you have with your health care provider. Document Released: 01/22/2015 Document Revised: 09/15/2015 Document Reviewed: 10/27/2014 Elsevier Interactive Patient Education  2017 [employer].  Fall Prevention in the Home Falls can cause injuries. They can happen to people of all ages. There are many things you can do to make your home safe and to help prevent falls. What can I do on the outside of my home? Regularly fix the edges of walkways and driveways and fix any cracks. Remove anything that might make you trip as you walk through a door, such as a raised step or threshold. Trim any bushes or trees on the path to your home. Use bright outdoor lighting. Clear any walking paths of anything that might make someone trip, such as rocks or tools. Regularly check to see if handrails are loose or broken. Make sure that both sides of any steps have handrails. Any raised decks and porches should have guardrails on the edges. Have any leaves, snow, or ice cleared regularly. Use sand or salt on walking paths during winter. Clean up any spills in your garage right away. This includes oil  or grease spills. What can I do in the bathroom? Use night lights. Install grab bars by the toilet and in the tub and shower. Do not use towel bars as grab bars. Use non-skid mats or decals in the tub or shower. If you need to sit down in the shower, use a plastic, non-slip stool. Keep the floor dry. Clean up any water that spills on the floor as soon as it happens. Remove soap buildup in the tub or shower regularly. Attach bath mats securely with double-sided non-slip rug tape. Do not have throw rugs and other things on the floor that can make you trip. What can I do in the bedroom? Use night lights. Make sure that you have a light by your bed that is easy to reach. Do not use any sheets or blankets that are too big for your bed. They should not hang down onto the floor. Have a firm chair that has side arms. You can use this for support while you get dressed. Do not have throw rugs and other things on the floor that can make you trip. What can I do  in the kitchen? Clean up any spills right away. Avoid walking on wet floors. Keep items that you use a lot in easy-to-reach places. If you need to reach something above you, use a strong step stool that has a grab bar. Keep electrical cords out of the way. Do not use floor polish or wax that makes floors slippery. If you must use wax, use non-skid floor wax. Do not have throw rugs and other things on the floor that can make you trip. What can I do with my stairs? Do not leave any items on the stairs. Make sure that there are handrails on both sides of the stairs and use them. Fix handrails that are broken or loose. Make sure that handrails are as long as the stairways. Check any carpeting to make sure that it is firmly attached to the stairs. Fix any carpet that is loose or worn. Avoid having throw rugs at the top or bottom of the stairs. If you do have throw rugs, attach them to the floor with carpet tape. Make sure that you have a light  switch at the top of the stairs and the bottom of the stairs. If you do not have them, ask someone to add them for you. What else can I do to help prevent falls? Wear shoes that: Do not have high heels. Have rubber bottoms. Are comfortable and fit you well. Are closed at the toe. Do not wear sandals. If you use a stepladder: Make sure that it is fully opened. Do not climb a closed stepladder. Make sure that both sides of the stepladder are locked into place. Ask someone to hold it for you, if possible. Clearly mark and make sure that you can see: Any grab bars or handrails. First and last steps. Where the edge of each step is. Use tools that help you move around (mobility aids) if they are needed. These include: Canes. Walkers. Scooters. Crutches. Turn on the lights when you go into a dark area. Replace any light bulbs as soon as they burn out. Set up your furniture so you have a clear path. Avoid moving your furniture around. If any of your floors are uneven, fix them. If there are any pets around you, be aware of where they are. Review your medicines with your doctor. Some medicines can make you feel dizzy. This can increase your chance of falling. Ask your doctor what other things that you can do to help prevent falls. This information is not intended to replace advice given to you by your health care provider. Make sure you discuss any questions you have with your health care provider. Document Released: 10/22/2008 Document Revised: 06/03/2015 Document Reviewed: 01/30/2014 Elsevier Interactive Patient Education  2017 [employer].

## 2022-08-17 ENCOUNTER — Ambulatory Visit
Admission: RE | Admit: 2022-08-17 | Discharge: 2022-08-17 | Disposition: A | Discharge status: Home / Self Care | Payer: Medicare Other | Source: Ambulatory Visit | Attending: Internal Medicine | Admitting: Internal Medicine

## 2022-08-17 DIAGNOSIS — Z1231 Encounter for screening mammogram for malignant neoplasm of breast: Secondary | ICD-10-CM | POA: Diagnosis not present

## 2022-08-31 ENCOUNTER — Telehealth: Payer: Self-pay | Admitting: Internal Medicine

## 2022-08-31 NOTE — Telephone Encounter (Signed)
Called pt let her know that she would need to fast fr her appt in Jan. Also that we will give her orders for labs at her appt. Pt verbalized understanding.  KP

## 2022-08-31 NOTE — Telephone Encounter (Signed)
Copied from CRM [id number]. Topic: Appointment Scheduling - Scheduling Inquiry for Clinic >> Aug 31, 2022  3:31 PM Nancy Chen wrote: Reason for CRM: The patient called to schedule an appt in January and to see if her provider wanted her to have fasting labs before hand. Please assist patient further as she is scheduled January 20th at 1:20 per her request

## 2022-09-26 DIAGNOSIS — R809 Proteinuria, unspecified: Secondary | ICD-10-CM | POA: Diagnosis not present

## 2022-09-26 DIAGNOSIS — E1159 Type 2 diabetes mellitus with other circulatory complications: Secondary | ICD-10-CM | POA: Diagnosis not present

## 2022-09-26 DIAGNOSIS — E1129 Type 2 diabetes mellitus with other diabetic kidney complication: Secondary | ICD-10-CM | POA: Diagnosis not present

## 2022-09-26 LAB — HEMOGLOBIN A1C: Hemoglobin A1C: 7.1

## 2022-10-10 DIAGNOSIS — Z23 Encounter for immunization: Secondary | ICD-10-CM | POA: Diagnosis not present

## 2022-10-22 ENCOUNTER — Other Ambulatory Visit: Payer: Self-pay | Admitting: Internal Medicine

## 2022-10-24 ENCOUNTER — Ambulatory Visit (INDEPENDENT_AMBULATORY_CARE_PROVIDER_SITE_OTHER): Payer: Medicare Other | Admitting: Internal Medicine

## 2022-10-24 ENCOUNTER — Encounter: Payer: Self-pay | Admitting: Internal Medicine

## 2022-10-24 VITALS — BP 112/74 | HR 73 | Ht 63.0 in | Wt 212.0 lb

## 2022-10-24 DIAGNOSIS — E118 Type 2 diabetes mellitus with unspecified complications: Secondary | ICD-10-CM | POA: Diagnosis not present

## 2022-10-24 DIAGNOSIS — G2581 Restless legs syndrome: Secondary | ICD-10-CM | POA: Insufficient documentation

## 2022-10-24 DIAGNOSIS — Z23 Encounter for immunization: Secondary | ICD-10-CM | POA: Diagnosis not present

## 2022-10-24 DIAGNOSIS — Z7984 Long term (current) use of oral hypoglycemic drugs: Secondary | ICD-10-CM | POA: Diagnosis not present

## 2022-10-24 DIAGNOSIS — F5101 Primary insomnia: Secondary | ICD-10-CM | POA: Diagnosis not present

## 2022-10-24 DIAGNOSIS — B356 Tinea cruris: Secondary | ICD-10-CM

## 2022-10-24 DIAGNOSIS — Z2911 Encounter for prophylactic immunotherapy for respiratory syncytial virus (RSV): Secondary | ICD-10-CM | POA: Diagnosis not present

## 2022-10-24 MED ORDER — TEMAZEPAM 15 MG PO CAPS
15.0000 mg | ORAL_CAPSULE | Freq: Every evening | ORAL | 0 refills | Status: DC | PRN
Start: 2022-10-24 — End: 2023-05-29

## 2022-10-24 MED ORDER — ROPINIROLE HCL 0.25 MG PO TABS
0.2500 mg | ORAL_TABLET | Freq: Every evening | ORAL | 0 refills | Status: DC | PRN
Start: 2022-10-24 — End: 2022-11-17

## 2022-10-24 MED ORDER — RSVPREF3 VAC RECOMB ADJUVANTED 120 MCG/0.5ML IM SUSR
0.5000 mL | Freq: Once | INTRAMUSCULAR | 0 refills | Status: AC
Start: 2022-10-24 — End: 2022-10-24

## 2022-10-24 NOTE — Assessment & Plan Note (Signed)
Long standing issues with mind racing Minimal benefit with Zquil or melatonin Will try temazepam -

## 2022-10-24 NOTE — Assessment & Plan Note (Signed)
Patient relates typical symptoms for years Much worse in the recent past and now seeking treatment Will start Requip at bedtime - can try PRN but may need nightly dosing.

## 2022-10-24 NOTE — Progress Notes (Signed)
Date:  10/24/2022   Name:  Nancy Chen   DOB:  [birth year]   MRN:  [id number]   Chief Complaint: Insomnia (Patient can go three days without sleep due to her mind not "shutting off."  Been dealing with this on and off for years.), restless leg (Patient said this is now better. After there is a storm, pain goes away. Wants to know if restless leg syndrome is aggravated by barometric pressure. ), and Rash (Rash on vagina. Patient is having night sweats, and feels like this has caused rash in this area. When this started it was red and warm. No itching but painful. Rash started Sunday morning.)  Insomnia Primary symptoms: sleep disturbance, difficulty falling asleep, frequent awakening.   Treatments tried: Tylenol PM. The treatment provided mild relief.  Rash This is a recurrent problem. The problem has been gradually improving since onset. The affected locations include the groin. The rash is characterized by itchiness and redness. Pertinent negatives include no fatigue, fever or shortness of breath.  Diabetes She presents for her follow-up diabetic visit. She has type 2 diabetes mellitus. Her disease course has been improving. Pertinent negatives for hypoglycemia include no dizziness, headaches or nervousness/anxiousness. Associated symptoms include foot paresthesias. Pertinent negatives for diabetes include no chest pain and no fatigue. Symptoms are improving. Current diabetic treatment includes oral agent (dual therapy) (metformin and jardiance; stopped trulicity due to lack of availability). Her weight is stable. Her home blood glucose trend is fluctuating minimally. An ACE inhibitor/angiotensin II receptor blocker is being taken. Eye exam is current.   RLS - has had symptoms for years.  Episodes of worsening and remitting, interrupts sleep all night.  No previous treatment.   Review of Systems  Constitutional:  Positive for unexpected weight change. Negative for appetite change, fatigue  and fever.  HENT:  Negative for trouble swallowing.   Respiratory:  Negative for chest tightness and shortness of breath.   Cardiovascular:  Negative for chest pain.  Musculoskeletal:  Positive for myalgias.  Skin:  Positive for rash.  Neurological:  Negative for dizziness and headaches.  Psychiatric/Behavioral:  Positive for sleep disturbance. Negative for dysphoric mood. The patient has insomnia. The patient is not nervous/anxious.      Lab Results  Component Value Date   NA 140 07/25/2022   K 4.3 07/25/2022   CO2 22 07/25/2022   GLUCOSE 142 (H) 07/25/2022   BUN 22 07/25/2022   CREATININE 0.92 07/25/2022   CALCIUM 10.5 (H) 07/25/2022   EGFR 65 07/25/2022   GFRNONAA 80 11/06/2019   Lab Results  Component Value Date   CHOL 178 07/25/2022   HDL 43 07/25/2022   LDLCALC 88 07/25/2022   TRIG 282 (H) 07/25/2022   CHOLHDL 4.1 07/25/2022   Lab Results  Component Value Date   TSH 1.410 07/25/2022   Lab Results  Component Value Date   HGBA1C 7.1 09/26/2022   Lab Results  Component Value Date   WBC 6.1 07/25/2022   HGB 14.2 07/25/2022   HCT 43.1 07/25/2022   MCV 90 07/25/2022   PLT 264 07/25/2022   Lab Results  Component Value Date   ALT 29 07/25/2022   AST 28 07/25/2022   ALKPHOS 55 07/25/2022   BILITOT 0.4 07/25/2022   Lab Results  Component Value Date   VD25OH 19.3 (L) 07/25/2022     Patient Active Problem List   Diagnosis Date Noted   Restless leg syndrome 10/24/2022   Primary insomnia 10/24/2022  Vitamin D deficiency 07/25/2022   Heart palpitations 02/15/2022   Aortic atherosclerosis (HCC) 07/15/2019   BMI 40.0-44.9, adult (HCC) 03/12/2019   Facial basal cell cancer 10/31/2016   Migraine 10/31/2016   Lipoma of left upper extremity 10/11/2015   Type II diabetes mellitus with complication (HCC) 02/11/2015   Ovarian failure 11/10/2014   Essential (primary) hypertension 07/15/2014   Hyperlipidemia associated with type 2 diabetes mellitus (HCC)  07/15/2014   Tricompartment osteoarthritis of knee 07/15/2014    Allergies  Allergen Reactions   Glipizide Swelling    hives   Latex    Codeine Hives    Past Surgical History:  Procedure Laterality Date   ABDOMINAL HYSTERECTOMY     BASAL CELL CARCINOMA EXCISION     CARPAL TUNNEL RELEASE Bilateral    CARPAL TUNNEL RELEASE     CATARACT EXTRACTION W/PHACO Left 09/07/2016   Procedure: CATARACT EXTRACTION PHACO AND INTRAOCULAR LENS PLACEMENT (IOC);  Surgeon: Nevada Crane, MD;  Location: [facility] ORS;  Service: Ophthalmology;  Laterality: Left;  [place] 00:41.9 AP% 14.9 CDE 6.23 Fluid pack lot # [id number]   CATARACT EXTRACTION W/PHACO Right 10/19/2016   Procedure: CATARACT EXTRACTION PHACO AND INTRAOCULAR LENS PLACEMENT (IOC);  Surgeon: Nevada Crane, MD;  Location: [facility] ORS;  Service: Ophthalmology;  Laterality: Right;  casette lot # [id number] [place]  00:59.5 AP%   16.5 CDE   9.79    COLONOSCOPY  06/2012   normal (in [place])   EYE SURGERY     TONSILLECTOMY     TUBAL LIGATION      Social History   Tobacco Use   Smoking status: Never   Smokeless tobacco: Never   Tobacco comments:    smoking cessation materials not required  Vaping Use   Vaping status: Never Used  Substance Use Topics   Alcohol use: Not Currently    Alcohol/week: 1.0 standard drink of alcohol    Types: 1 Glasses of wine per week    Comment: once monthly maybe   Drug use: No     Medication list has been reviewed and updated.  Current Meds  Medication Sig   Acetaminophen (TYLENOL ARTHRITIS PAIN PO) Take 1 capsule by mouth 2 (two) times daily.   aspirin 81 MG chewable tablet Chew by mouth daily.   Bioflavonoids (BIOFLAVONOID-1000) 1000 MG TABS Take by mouth.   cetirizine (ZYRTEC) 10 MG tablet Take 10 mg by mouth as needed.    empagliflozin (JARDIANCE) 25 MG TABS tablet Take 25 mg by mouth daily.   glucose blood test strip    lisinopril-hydrochlorothiazide (ZESTORETIC) 20-12.5 MG tablet TAKE 1 TABLET DAILY    metFORMIN (GLUCOPHAGE) 1000 MG tablet TAKE 1 TABLET TWICE A DAY (Patient taking differently: Take 1,000 mg by mouth 2 (two) times daily. SOLUM)   naproxen sodium (ANAPROX) 220 MG tablet Take 220 mg by mouth 2 (two) times daily with a meal. PRN   NON FORMULARY CBD ointment   Pyridoxine HCl (VITAMIN B6) 200 MG TABS Take 100 mg by mouth 2 (two) times daily.   rOPINIRole (REQUIP) 0.25 MG tablet Take 1 tablet (0.25 mg total) by mouth at bedtime as needed. For restless leg symptoms   RSV vaccine recomb adjuvanted (AREXVY) 120 MCG/0.5ML injection Inject 0.5 mLs into the muscle once for 1 dose.   simvastatin (ZOCOR) 20 MG tablet TAKE 1 TABLET DAILY   temazepam (RESTORIL) 15 MG capsule Take 1 capsule (15 mg total) by mouth at bedtime as needed for sleep.   Turmeric (QC TUMERIC  COMPLEX PO) Take by mouth.       07/25/2022    9:55 AM 02/15/2022    2:54 PM 12/22/2021    8:58 AM 07/26/2021   10:50 AM  GAD 7 : Generalized Anxiety Score  Nervous, Anxious, on Edge 0 1 0 0  Control/stop worrying 0 1 0 0  Worry too much - different things 0 1 0 0  Trouble relaxing 0 0 0 0  Restless 0 0 0 0  Easily annoyed or irritable 0 0 0 0  Afraid - awful might happen 0 0 0 0  Total GAD 7 Score 0 3 0 0  Anxiety Difficulty Not difficult at all Not difficult at all Not difficult at all Not difficult at all       07/26/2022   11:37 AM 07/25/2022    9:55 AM 02/15/2022    2:53 PM  Depression screen PHQ 2/9  Decreased Interest 0 0 0  Down, Depressed, Hopeless 0 0 0  PHQ - 2 Score 0 0 0  Altered sleeping 0 1 3  Tired, decreased energy 0 1 3  Change in appetite 0 1 0  Feeling bad or failure about yourself  0 0 0  Trouble concentrating 0 0 0  Moving slowly or fidgety/restless 0 0 0  Suicidal thoughts 0 0 0  PHQ-9 Score 0 3 6  Difficult doing work/chores Not difficult at all Not difficult at all Not difficult at all    BP Readings from Last 3 Encounters:  10/24/22 112/74  07/25/22 106/72  06/28/22 (!) 104/56     Physical Exam Vitals and nursing note reviewed.  Constitutional:      General: She is not in acute distress.    Appearance: She is well-developed.  HENT:     Head: Normocephalic and atraumatic.  Cardiovascular:     Rate and Rhythm: Normal rate and regular rhythm.     Heart sounds: No murmur heard. Pulmonary:     Effort: Pulmonary effort is normal. No respiratory distress.     Breath sounds: No wheezing or rhonchi.  Musculoskeletal:     Cervical back: Normal range of motion.     Right lower leg: No edema.     Left lower leg: No edema.  Lymphadenopathy:     Cervical: No cervical adenopathy.  Skin:    General: Skin is warm and dry.     Findings: Rash (in groin and under lower abdomen) present.  Neurological:     Mental Status: She is alert and oriented to person, place, and time.     Gait: Gait abnormal (uses rollator).  Psychiatric:        Mood and Affect: Mood normal.        Behavior: Behavior normal.    Diabetic Foot Exam - Simple   Simple Foot Form Diabetic Foot exam was performed with the following findings: Yes 10/24/2022  4:07 PM  Visual Inspection No deformities, no ulcerations, no other skin breakdown bilaterally: Yes Sensation Testing See comments: Yes Pulse Check Posterior Tibialis and Dorsalis pulse intact bilaterally: Yes Comments Mild decrease in light touch      Wt Readings from Last 3 Encounters:  10/24/22 212 lb (96.2 kg)  07/26/22 211 lb (95.7 kg)  07/25/22 211 lb 9.6 oz (96 kg)    BP 112/74   Pulse 73   Ht 5\' 3"  (1.6 m)   Wt 212 lb (96.2 kg)   SpO2 95%   BMI 37.55 kg/m   Assessment and  Plan:  Problem List Items Addressed This Visit       Unprioritized   Primary insomnia    Long standing issues with mind racing Minimal benefit with Zquil or melatonin Will try temazepam -       Relevant Medications   temazepam (RESTORIL) 15 MG capsule   Restless leg syndrome - Primary    Patient relates typical symptoms for years Much  worse in the recent past and now seeking treatment Will start Requip at bedtime - can try PRN but may need nightly dosing.      Relevant Medications   rOPINIRole (REQUIP) 0.25 MG tablet   Type II diabetes mellitus with complication (HCC) (Chronic)    Blood sugars stable without hypoglycemic symptoms or events. Current regimen is metformin and Jardiance. Changes made last visit are - stopping Trulicity due to availability. Patient is dissatisfied with Dr. Tedd Sias. Lab Results  Component Value Date   HGBA1C 7.1 09/26/2022  Continue current therapy, FSBS daily fasting and PRN, follow up in January.       Other Visit Diagnoses     Need for COVID-19 vaccine       Relevant Orders   Pfizer Comirnaty Covid -19 Vaccine 18yrs and older (Completed)   Tinea cruris       improving with topical prescription cream (?nystatin or lotrisone)   Relevant Medications   RSV vaccine recomb adjuvanted (AREXVY) 120 MCG/0.5ML injection   Need for RSV vaccination       Relevant Medications   RSV vaccine recomb adjuvanted (AREXVY) 120 MCG/0.5ML injection       No follow-ups on file.    Reubin Milan, MD [facility] [place]

## 2022-10-24 NOTE — Assessment & Plan Note (Addendum)
Blood sugars stable without hypoglycemic symptoms or events. Current regimen is metformin and Jardiance. Changes made last visit are - stopping Trulicity due to availability. Patient is dissatisfied with Dr. Tedd Sias. Lab Results  Component Value Date   HGBA1C 7.1 09/26/2022  Continue current therapy, FSBS daily fasting and PRN, follow up in January.

## 2022-11-16 ENCOUNTER — Other Ambulatory Visit: Payer: Self-pay | Admitting: Internal Medicine

## 2022-11-16 DIAGNOSIS — G2581 Restless legs syndrome: Secondary | ICD-10-CM

## 2022-11-16 NOTE — Telephone Encounter (Signed)
Requested medications are due for refill today.  yes  Requested medications are on the active medications list.  yes  Last refill. 10/24/2022  Future visit scheduled.   yes  Notes to clinic.  This is a new medication for the pt. Please review for refill.    Requested Prescriptions  Pending Prescriptions Disp Refills   rOPINIRole (REQUIP) 0.25 MG tablet [Pharmacy Med Name: ROPINIROLE HCL 0.25 MG TABLET] 90 tablet 1    Sig: Take 1 tablet (0.25 mg total) by mouth at bedtime as needed. For restless leg symptoms     Neurology:  Parkinsonian Agents Passed - 11/16/2022  1:30 PM      Passed - Last BP in normal range    BP Readings from Last 1 Encounters:  10/24/22 112/74         Passed - Last Heart Rate in normal range    Pulse Readings from Last 1 Encounters:  10/24/22 73         Passed - Valid encounter within last 12 months    Recent Outpatient Visits           3 weeks ago Restless leg syndrome   [place] [facility] at [facility], Nyoka Cowden, MD   3 months ago Essential (primary) hypertension   [place] [facility] at [facility], Nyoka Cowden, MD   9 months ago Essential (primary) hypertension   [place] [facility] at [facility], Nyoka Cowden, MD   10 months ago Influenza A H1N1 infection   [place] [facility] at [facility], Nyoka Cowden, MD   1 year ago Essential (primary) hypertension   [place] [facility] at [facility], Nyoka Cowden, MD       Future Appointments             In 2 months Judithann Graves, Nyoka Cowden, MD [facility] at [facility], [facility]   In 8 months Judithann Graves, Nyoka Cowden, MD [facility] at [facility], [facility]

## 2022-12-03 ENCOUNTER — Other Ambulatory Visit: Payer: Self-pay | Admitting: Internal Medicine

## 2022-12-05 NOTE — Telephone Encounter (Signed)
Requested Prescriptions  Pending Prescriptions Disp Refills   lisinopril-hydrochlorothiazide (ZESTORETIC) 20-12.5 MG tablet [Pharmacy Med Name: LISINOP/HCTZ TAB 20-12.5] 90 tablet 0    Sig: TAKE 1 TABLET DAILY     Cardiovascular:  ACEI + Diuretic Combos Passed - 12/03/2022  3:01 PM      Passed - Na in normal range and within 180 days    Sodium  Date Value Ref Range Status  07/25/2022 140 134 - 144 mmol/L Final         Passed - K in normal range and within 180 days    Potassium  Date Value Ref Range Status  07/25/2022 4.3 3.5 - 5.2 mmol/L Final         Passed - Cr in normal range and within 180 days    Creatinine, Ser  Date Value Ref Range Status  07/25/2022 0.92 0.57 - 1.00 mg/dL Final   Creatinine, Urine  Date Value Ref Range Status  01/18/2022 239  Final         Passed - eGFR is 30 or above and within 180 days    GFR calc Af Amer  Date Value Ref Range Status  11/06/2019 92 >59 mL/min/1.73 Final    Comment:    **In accordance with recommendations from the NKF-ASN Task force,**   [facility] is in the process of updating its eGFR calculation to the   2021 CKD-EPI creatinine equation that estimates kidney function   without a race variable.    GFR calc non Af Amer  Date Value Ref Range Status  11/06/2019 80 >59 mL/min/1.73 Final   eGFR  Date Value Ref Range Status  07/25/2022 65 >59 mL/min/1.73 Final         Passed - Patient is not pregnant      Passed - Last BP in normal range    BP Readings from Last 1 Encounters:  10/24/22 112/74         Passed - Valid encounter within last 6 months    Recent Outpatient Visits           1 month ago Restless leg syndrome   [place] [facility] at [facility], Nyoka Cowden, MD   4 months ago Essential (primary) hypertension   [place] [facility] at [facility], Nyoka Cowden, MD   9 months ago Essential (primary) hypertension   [place] [facility] at [facility], Nyoka Cowden, MD   11 months ago Influenza A H1N1 infection   [place] [facility] at [facility], Nyoka Cowden, MD   1 year ago Essential (primary) hypertension   [place] [facility] at [facility], Nyoka Cowden, MD       Future Appointments             In 1 month Judithann Graves, Nyoka Cowden, MD [facility] at [facility], [facility]   In 7 months Judithann Graves, Nyoka Cowden, MD [facility] at [facility], [facility]

## 2022-12-09 ENCOUNTER — Other Ambulatory Visit: Payer: Self-pay | Admitting: Internal Medicine

## 2022-12-09 DIAGNOSIS — G2581 Restless legs syndrome: Secondary | ICD-10-CM

## 2022-12-12 NOTE — Telephone Encounter (Signed)
Requested medication (s) are due for refill today: yes  Requested medication (s) are on the active medication list: yes  Last refill:  11/17/22 #30 1 RF  Future visit scheduled: yes  Notes to clinic:  90 day refill requested    Requested Prescriptions  Pending Prescriptions Disp Refills   rOPINIRole (REQUIP) 0.25 MG tablet [Pharmacy Med Name: ROPINIROLE HCL 0.25 MG TABLET] 90 tablet 1    Sig: TAKE 1 TABLET (0.25 MG TOTAL) BY MOUTH AT BEDTIME AS NEEDED. FOR RESTLESS LEG SYMPTOMS     Neurology:  Parkinsonian Agents Passed - 12/09/2022  8:32 AM      Passed - Last BP in normal range    BP Readings from Last 1 Encounters:  10/24/22 112/74         Passed - Last Heart Rate in normal range    Pulse Readings from Last 1 Encounters:  10/24/22 73         Passed - Valid encounter within last 12 months    Recent Outpatient Visits           1 month ago Restless leg syndrome   [place] [facility] at [facility], Nyoka Cowden, MD   4 months ago Essential (primary) hypertension   [place] [facility] at [facility], Nyoka Cowden, MD   10 months ago Essential (primary) hypertension   [place] [facility] at [facility], Nyoka Cowden, MD   11 months ago Influenza A H1N1 infection   [place] [facility] at [facility], Nyoka Cowden, MD   1 year ago Essential (primary) hypertension   [place] [facility] at [facility], Nyoka Cowden, MD       Future Appointments             In 1 month Judithann Graves, Nyoka Cowden, MD [facility] Medicine at [facility], [facility]   In 7 months Judithann Graves, Nyoka Cowden, MD [facility] at [facility], [facility]

## 2023-01-29 ENCOUNTER — Ambulatory Visit (INDEPENDENT_AMBULATORY_CARE_PROVIDER_SITE_OTHER): Payer: Medicare Other | Admitting: Internal Medicine

## 2023-01-29 ENCOUNTER — Encounter: Payer: Self-pay | Admitting: Internal Medicine

## 2023-01-29 VITALS — BP 116/70 | HR 72 | Ht 63.0 in | Wt 210.0 lb

## 2023-01-29 DIAGNOSIS — I10 Essential (primary) hypertension: Secondary | ICD-10-CM | POA: Diagnosis not present

## 2023-01-29 DIAGNOSIS — Z7984 Long term (current) use of oral hypoglycemic drugs: Secondary | ICD-10-CM | POA: Diagnosis not present

## 2023-01-29 DIAGNOSIS — E118 Type 2 diabetes mellitus with unspecified complications: Secondary | ICD-10-CM

## 2023-01-29 DIAGNOSIS — Z6837 Body mass index (BMI) 37.0-37.9, adult: Secondary | ICD-10-CM | POA: Diagnosis not present

## 2023-01-29 DIAGNOSIS — F5101 Primary insomnia: Secondary | ICD-10-CM

## 2023-01-29 DIAGNOSIS — E785 Hyperlipidemia, unspecified: Secondary | ICD-10-CM

## 2023-01-29 DIAGNOSIS — E1169 Type 2 diabetes mellitus with other specified complication: Secondary | ICD-10-CM | POA: Diagnosis not present

## 2023-01-29 MED ORDER — EMPAGLIFLOZIN 25 MG PO TABS: 25.0000 mg | ORAL_TABLET | Freq: Every day | ORAL | 1 refills | Status: DC

## 2023-01-29 MED ORDER — SIMVASTATIN 20 MG PO TABS: 20.0000 mg | ORAL_TABLET | Freq: Every day | ORAL | 1 refills | Status: DC

## 2023-01-29 MED ORDER — LISINOPRIL-HYDROCHLOROTHIAZIDE 20-12.5 MG PO TABS: 1.0000 | ORAL_TABLET | Freq: Every day | ORAL | 1 refills | Status: DC

## 2023-01-29 MED ORDER — METFORMIN HCL 1000 MG PO TABS: 1000.0000 mg | ORAL_TABLET | Freq: Two times a day (BID) | ORAL | 1 refills | Status: DC

## 2023-01-29 NOTE — Progress Notes (Signed)
Date:  01/29/2023   Name:  Nancy Chen   DOB:  Jun 01, 1945   MRN:  [id number]   Chief Complaint: Hypertension, Diabetes, and Fatigue (Wants to know if she should start back on B12 supplement? Also, wants to know if she should continue Vit D3 daily. )  Hypertension This is a chronic problem. The problem is controlled. Pertinent negatives include no chest pain, headaches, palpitations or shortness of breath. Past treatments include ACE inhibitors and diuretics.  Diabetes She presents for her follow-up diabetic visit. She has type 2 diabetes mellitus. Her disease course has been stable. Pertinent negatives for hypoglycemia include no headaches or tremors. There are no diabetic associated symptoms. Pertinent negatives for diabetes include no chest pain, no fatigue, no polydipsia and no polyuria. Symptoms are stable. Current diabetic treatment includes oral agent (dual therapy). Her home blood glucose trend is decreasing steadily.    Review of Systems  Constitutional:  Negative for appetite change, fatigue, fever and unexpected weight change.  HENT:  Negative for tinnitus and trouble swallowing.   Eyes:  Negative for visual disturbance.  Respiratory:  Negative for cough, chest tightness and shortness of breath.   Cardiovascular:  Negative for chest pain, palpitations and leg swelling.  Gastrointestinal:  Negative for abdominal pain.  Endocrine: Negative for polydipsia and polyuria.  Genitourinary:  Negative for dysuria and hematuria.  Musculoskeletal:  Negative for arthralgias.  Neurological:  Negative for tremors, numbness and headaches.  Psychiatric/Behavioral:  Negative for dysphoric mood.      Lab Results  Component Value Date   NA 140 07/25/2022   K 4.3 07/25/2022   CO2 22 07/25/2022   GLUCOSE 142 (H) 07/25/2022   BUN 22 07/25/2022   CREATININE 0.92 07/25/2022   CALCIUM 10.5 (H) 07/25/2022   EGFR 65 07/25/2022   GFRNONAA 80 11/06/2019   Lab Results  Component Value Date    CHOL 178 07/25/2022   HDL 43 07/25/2022   LDLCALC 88 07/25/2022   TRIG 282 (H) 07/25/2022   CHOLHDL 4.1 07/25/2022   Lab Results  Component Value Date   TSH 1.410 07/25/2022   Lab Results  Component Value Date   HGBA1C 7.1 09/26/2022   Lab Results  Component Value Date   WBC 6.1 07/25/2022   HGB 14.2 07/25/2022   HCT 43.1 07/25/2022   MCV 90 07/25/2022   PLT 264 07/25/2022   Lab Results  Component Value Date   ALT 29 07/25/2022   AST 28 07/25/2022   ALKPHOS 55 07/25/2022   BILITOT 0.4 07/25/2022   Lab Results  Component Value Date   VD25OH 19.3 (L) 07/25/2022     Patient Active Problem List   Diagnosis Date Noted   Restless leg syndrome 10/24/2022   Primary insomnia 10/24/2022   Vitamin D deficiency 07/25/2022   Heart palpitations 02/15/2022   Aortic atherosclerosis (HCC) 07/15/2019   Facial basal cell cancer 10/31/2016   Migraine 10/31/2016   Lipoma of left upper extremity 10/11/2015   Type II diabetes mellitus with complication (HCC) 02/11/2015   Ovarian failure 11/10/2014   Essential (primary) hypertension 07/15/2014   Hyperlipidemia associated with type 2 diabetes mellitus (HCC) 07/15/2014   Tricompartment osteoarthritis of knee 07/15/2014    Allergies  Allergen Reactions   Glipizide Swelling    hives   Latex    Codeine Hives    Past Surgical History:  Procedure Laterality Date   ABDOMINAL HYSTERECTOMY     BASAL CELL CARCINOMA EXCISION     CARPAL  TUNNEL RELEASE Bilateral    CARPAL TUNNEL RELEASE     CATARACT EXTRACTION W/PHACO Left 09/07/2016   Procedure: CATARACT EXTRACTION PHACO AND INTRAOCULAR LENS PLACEMENT (IOC);  Surgeon: Nevada Crane, MD;  Location: [facility] ORS;  Service: Ophthalmology;  Laterality: Left;  [place] 00:41.9 AP% 14.9 CDE 6.23 Fluid pack lot # [id number]   CATARACT EXTRACTION W/PHACO Right 10/19/2016   Procedure: CATARACT EXTRACTION PHACO AND INTRAOCULAR LENS PLACEMENT (IOC);  Surgeon: Nevada Crane, MD;  Location:  [facility] ORS;  Service: Ophthalmology;  Laterality: Right;  casette lot # [id number] H [place]  00:59.5 AP%   16.5 CDE   9.79    COLONOSCOPY  06/2012   normal (in [place])   EYE SURGERY     TONSILLECTOMY     TUBAL LIGATION      Social History   Tobacco Use   Smoking status: Never   Smokeless tobacco: Never   Tobacco comments:    smoking cessation materials not required  Vaping Use   Vaping status: Never Used  Substance Use Topics   Alcohol use: Not Currently    Alcohol/week: 1.0 standard drink of alcohol    Types: 1 Glasses of wine per week    Comment: once monthly maybe   Drug use: No     Medication list has been reviewed and updated.  Current Meds  Medication Sig   Acetaminophen (TYLENOL ARTHRITIS PAIN PO) Take 1 capsule by mouth 2 (two) times daily.   aspirin 81 MG chewable tablet Chew by mouth daily.   cetirizine (ZYRTEC) 10 MG tablet Take 10 mg by mouth as needed.    glucose blood test strip    naproxen sodium (ANAPROX) 220 MG tablet Take 220 mg by mouth 2 (two) times daily with a meal. PRN   NON FORMULARY CBD ointment   Pyridoxine HCl (VITAMIN B6) 200 MG TABS Take 100 mg by mouth 2 (two) times daily.   rOPINIRole (REQUIP) 0.25 MG tablet TAKE 1 TABLET (0.25 MG TOTAL) BY MOUTH AT BEDTIME AS NEEDED. FOR RESTLESS LEG SYMPTOMS   temazepam (RESTORIL) 15 MG capsule Take 1 capsule (15 mg total) by mouth at bedtime as needed for sleep.   Turmeric (QC TUMERIC COMPLEX PO) Take by mouth.   [DISCONTINUED] empagliflozin (JARDIANCE) 25 MG TABS tablet Take 25 mg by mouth daily.   [DISCONTINUED] lisinopril-hydrochlorothiazide (ZESTORETIC) 20-12.5 MG tablet TAKE 1 TABLET DAILY   [DISCONTINUED] metFORMIN (GLUCOPHAGE) 1000 MG tablet TAKE 1 TABLET TWICE A DAY (Patient taking differently: Take 1,000 mg by mouth 2 (two) times daily. SOLUM)   [DISCONTINUED] simvastatin (ZOCOR) 20 MG tablet TAKE 1 TABLET DAILY       01/29/2023    1:27 PM 07/25/2022    9:55 AM 02/15/2022    2:54 PM 12/22/2021    8:58 AM   GAD 7 : Generalized Anxiety Score  Nervous, Anxious, on Edge 0 0 1 0  Control/stop worrying 0 0 1 0  Worry too much - different things 0 0 1 0  Trouble relaxing 0 0 0 0  Restless 0 0 0 0  Easily annoyed or irritable 0 0 0 0  Afraid - awful might happen 0 0 0 0  Total GAD 7 Score 0 0 3 0  Anxiety Difficulty Not difficult at all Not difficult at all Not difficult at all Not difficult at all       01/29/2023    1:27 PM 07/26/2022   11:37 AM 07/25/2022    9:55 AM  Depression screen PHQ  2/9  Decreased Interest 0 0 0  Down, Depressed, Hopeless 0 0 0  PHQ - 2 Score 0 0 0  Altered sleeping 1 0 1  Tired, decreased energy 1 0 1  Change in appetite 0 0 1  Feeling bad or failure about yourself  0 0 0  Trouble concentrating 0 0 0  Moving slowly or fidgety/restless 0 0 0  Suicidal thoughts 0 0 0  PHQ-9 Score 2 0 3  Difficult doing work/chores Not difficult at all Not difficult at all Not difficult at all    BP Readings from Last 3 Encounters:  01/29/23 116/70  10/24/22 112/74  07/25/22 106/72    Physical Exam Vitals and nursing note reviewed.  Constitutional:      General: She is not in acute distress.    Appearance: Normal appearance. She is well-developed.  HENT:     Head: Normocephalic and atraumatic.  Cardiovascular:     Rate and Rhythm: Normal rate and regular rhythm.     Pulses: Normal pulses.     Heart sounds: No murmur heard. Pulmonary:     Effort: Pulmonary effort is normal. No respiratory distress.     Breath sounds: No wheezing or rhonchi.  Musculoskeletal:     Cervical back: Normal range of motion.     Right lower leg: No edema.     Left lower leg: No edema.  Lymphadenopathy:     Cervical: No cervical adenopathy.  Skin:    General: Skin is warm and dry.     Findings: No rash.  Neurological:     Mental Status: She is alert and oriented to person, place, and time.  Psychiatric:        Mood and Affect: Mood normal.        Behavior: Behavior normal.      Wt Readings from Last 3 Encounters:  01/29/23 210 lb (95.3 kg)  10/24/22 212 lb (96.2 kg)  07/26/22 211 lb (95.7 kg)    BP 116/70   Pulse 72   Ht 5\' 3"  (1.6 m)   Wt 210 lb (95.3 kg)   SpO2 96%   BMI 37.20 kg/m   Assessment and Plan:  Problem List Items Addressed This Visit       Unprioritized   Essential (primary) hypertension (Chronic)   Controlled BP with normal exam. Current regimen is lisinopril hct. Will continue same medications; encourage continued reduced sodium diet.       Relevant Medications   simvastatin (ZOCOR) 20 MG tablet   lisinopril-hydrochlorothiazide (ZESTORETIC) 20-12.5 MG tablet   Hyperlipidemia associated with type 2 diabetes mellitus (HCC) (Chronic)   LDL is  Lab Results  Component Value Date   LDLCALC 88 07/25/2022   Current regimen is simvastain.  Tolerating medications well without issues.       Relevant Medications   metFORMIN (GLUCOPHAGE) 1000 MG tablet   simvastatin (ZOCOR) 20 MG tablet   lisinopril-hydrochlorothiazide (ZESTORETIC) 20-12.5 MG tablet   empagliflozin (JARDIANCE) 25 MG TABS tablet   Type II diabetes mellitus with complication (HCC) - Primary (Chronic)   Blood sugars stable without hypoglycemic symptoms or events. Currently managed with metformin and Jardiance. Changes made last visit are stopping trulicity. Lab Results  Component Value Date   HGBA1C 7.1 09/26/2022         Relevant Medications   metFORMIN (GLUCOPHAGE) 1000 MG tablet   simvastatin (ZOCOR) 20 MG tablet   lisinopril-hydrochlorothiazide (ZESTORETIC) 20-12.5 MG tablet   empagliflozin (JARDIANCE) 25 MG TABS tablet  Other Relevant Orders   Comprehensive metabolic panel   Microalbumin / creatinine urine ratio   Hemoglobin A1c   Vitamin B12   RESOLVED: BMI 40.0-44.9, adult (HCC)   Relevant Medications   metFORMIN (GLUCOPHAGE) 1000 MG tablet   empagliflozin (JARDIANCE) 25 MG TABS tablet   Primary insomnia   Currently sleeping well without  medications Was taking temazepam but that is on hold She tried Requip but not sure about benefit.      Other Visit Diagnoses       Long term current use of oral hypoglycemic drug       Relevant Orders   Vitamin B12       Return in about 4 months (around 05/29/2023) for Medicare annual.    Reubin Milan, MD [facility] [place]

## 2023-01-29 NOTE — Assessment & Plan Note (Signed)
 Controlled BP with normal exam. Current regimen is lisinopril hct. Will continue same medications; encourage continued reduced sodium diet.

## 2023-01-29 NOTE — Assessment & Plan Note (Signed)
Blood sugars stable without hypoglycemic symptoms or events. Currently managed with metformin and Jardiance. Changes made last visit are stopping trulicity. Lab Results  Component Value Date   HGBA1C 7.1 09/26/2022

## 2023-01-29 NOTE — Assessment & Plan Note (Signed)
Currently sleeping well without medications Was taking temazepam but that is on hold She tried Requip but not sure about benefit.

## 2023-01-29 NOTE — Assessment & Plan Note (Signed)
LDL is  Lab Results  Component Value Date   LDLCALC 88 07/25/2022   Current regimen is simvastain.  Tolerating medications well without issues.

## 2023-01-30 LAB — HEMOGLOBIN A1C
Est. average glucose Bld gHb Est-mCnc: 177 mg/dL
Hgb A1c MFr Bld: 7.8 % — ABNORMAL HIGH (ref 4.8–5.6)

## 2023-01-30 LAB — COMPREHENSIVE METABOLIC PANEL
ALT: 23 [IU]/L (ref 0–32)
AST: 20 [IU]/L (ref 0–40)
Albumin: 5 g/dL — ABNORMAL HIGH (ref 3.8–4.8)
Alkaline Phosphatase: 63 [IU]/L (ref 44–121)
BUN/Creatinine Ratio: 21 (ref 12–28)
BUN: 16 mg/dL (ref 8–27)
Bilirubin Total: 0.5 mg/dL (ref 0.0–1.2)
CO2: 24 mmol/L (ref 20–29)
Calcium: 10.6 mg/dL — ABNORMAL HIGH (ref 8.7–10.3)
Chloride: 99 mmol/L (ref 96–106)
Creatinine, Ser: 0.78 mg/dL (ref 0.57–1.00)
Globulin, Total: 2.4 g/dL (ref 1.5–4.5)
Glucose: 139 mg/dL — ABNORMAL HIGH (ref 70–99)
Potassium: 4.1 mmol/L (ref 3.5–5.2)
Sodium: 140 mmol/L (ref 134–144)
Total Protein: 7.4 g/dL (ref 6.0–8.5)
eGFR: 79 mL/min/{1.73_m2} (ref >59)

## 2023-01-30 LAB — MICROALBUMIN / CREATININE URINE RATIO
Creatinine, Urine: 71.5 mg/dL
Microalb/Creat Ratio: 29 mg/g{creat} (ref 0–29)
Microalbumin, Urine: 20.6 ug/mL

## 2023-01-30 LAB — VITAMIN B12: Vitamin B-12: 224 pg/mL — ABNORMAL LOW (ref 232–1245)

## 2023-02-07 DIAGNOSIS — L578 Other skin changes due to chronic exposure to nonionizing radiation: Secondary | ICD-10-CM | POA: Diagnosis not present

## 2023-02-07 DIAGNOSIS — Z85828 Personal history of other malignant neoplasm of skin: Secondary | ICD-10-CM | POA: Diagnosis not present

## 2023-02-07 DIAGNOSIS — Z872 Personal history of diseases of the skin and subcutaneous tissue: Secondary | ICD-10-CM | POA: Diagnosis not present

## 2023-02-07 DIAGNOSIS — L570 Actinic keratosis: Secondary | ICD-10-CM | POA: Diagnosis not present

## 2023-02-07 DIAGNOSIS — Z86018 Personal history of other benign neoplasm: Secondary | ICD-10-CM | POA: Diagnosis not present

## 2023-02-07 DIAGNOSIS — C44519 Basal cell carcinoma of skin of other part of trunk: Secondary | ICD-10-CM | POA: Diagnosis not present

## 2023-02-07 DIAGNOSIS — Z859 Personal history of malignant neoplasm, unspecified: Secondary | ICD-10-CM | POA: Diagnosis not present

## 2023-02-07 DIAGNOSIS — D485 Neoplasm of uncertain behavior of skin: Secondary | ICD-10-CM | POA: Diagnosis not present

## 2023-02-14 ENCOUNTER — Encounter: Payer: Self-pay | Admitting: Internal Medicine

## 2023-03-14 DIAGNOSIS — C44519 Basal cell carcinoma of skin of other part of trunk: Secondary | ICD-10-CM | POA: Diagnosis not present

## 2023-03-14 DIAGNOSIS — C4491 Basal cell carcinoma of skin, unspecified: Secondary | ICD-10-CM | POA: Diagnosis not present

## 2023-04-13 ENCOUNTER — Ambulatory Visit: Payer: Self-pay

## 2023-04-13 ENCOUNTER — Other Ambulatory Visit (HOSPITAL_COMMUNITY)
Admission: RE | Admit: 2023-04-13 | Discharge: 2023-04-13 | Disposition: A | Discharge status: Home / Self Care | Source: Ambulatory Visit | Attending: Family Medicine | Admitting: Family Medicine

## 2023-04-13 ENCOUNTER — Ambulatory Visit (INDEPENDENT_AMBULATORY_CARE_PROVIDER_SITE_OTHER): Admitting: Family Medicine

## 2023-04-13 ENCOUNTER — Encounter: Payer: Self-pay | Admitting: Family Medicine

## 2023-04-13 VITALS — BP 114/76 | HR 86 | Ht 63.0 in | Wt 211.8 lb

## 2023-04-13 DIAGNOSIS — R300 Dysuria: Secondary | ICD-10-CM | POA: Diagnosis not present

## 2023-04-13 DIAGNOSIS — N760 Acute vaginitis: Secondary | ICD-10-CM | POA: Diagnosis not present

## 2023-04-13 DIAGNOSIS — B379 Candidiasis, unspecified: Secondary | ICD-10-CM

## 2023-04-13 DIAGNOSIS — Z7984 Long term (current) use of oral hypoglycemic drugs: Secondary | ICD-10-CM

## 2023-04-13 DIAGNOSIS — E118 Type 2 diabetes mellitus with unspecified complications: Secondary | ICD-10-CM

## 2023-04-13 MED ORDER — MICONAZOLE NITRATE 200 MG VA SUPP: 200.0000 mg | Freq: Every day | VAGINAL | 0 refills | Status: DC

## 2023-04-13 MED ORDER — NYSTATIN 100000 UNIT/GM EX CREA: 1.0000 | TOPICAL_CREAM | Freq: Two times a day (BID) | CUTANEOUS | 0 refills | Status: DC

## 2023-04-13 MED ORDER — FLUCONAZOLE 200 MG PO TABS: 200.0000 mg | ORAL_TABLET | Freq: Every day | ORAL | 0 refills | Status: DC

## 2023-04-13 NOTE — Progress Notes (Signed)
 Date:  04/13/2023   Name:  Nancy Chen   DOB:  September 09, 1945   MRN:  [id number]   Chief Complaint: Vaginal Pain (Heat, pain, and pressure when sitting. This started over the last 3 days. )  Vaginal Pain The patient's primary symptoms include a genital rash and vaginal discharge. The patient's pertinent negatives include no genital itching, genital lesions, genital odor, pelvic pain or vaginal bleeding. This is a new problem. The current episode started in the past 7 days. The problem occurs constantly. The problem has been gradually worsening. The pain is moderate. The problem affects both sides. Pertinent negatives include no abdominal pain, chills, dysuria, fever, flank pain, frequency, hematuria or urgency. The vaginal discharge was thick and yellow. There has been no bleeding. Exacerbated by: jardiance. Treatments tried: vaseline. She is not sexually active.  Vaginal Discharge The patient's primary symptoms include a genital rash and vaginal discharge. The patient's pertinent negatives include no genital itching, genital lesions, genital odor, pelvic pain or vaginal bleeding. The problem has been gradually worsening. The pain is moderate. Pertinent negatives include no abdominal pain, chills, dysuria, fever, flank pain, frequency, hematuria or urgency. The vaginal discharge was yellow and thick. Treatments tried: jardiance.    Lab Results  Component Value Date   NA 140 01/29/2023   K 4.1 01/29/2023   CO2 24 01/29/2023   GLUCOSE 139 (H) 01/29/2023   BUN 16 01/29/2023   CREATININE 0.78 01/29/2023   CALCIUM 10.6 (H) 01/29/2023   EGFR 79 01/29/2023   GFRNONAA 80 11/06/2019   Lab Results  Component Value Date   CHOL 178 07/25/2022   HDL 43 07/25/2022   LDLCALC 88 07/25/2022   TRIG 282 (H) 07/25/2022   CHOLHDL 4.1 07/25/2022   Lab Results  Component Value Date   TSH 1.410 07/25/2022   Lab Results  Component Value Date   HGBA1C 7.8 (H) 01/29/2023   Lab Results  Component  Value Date   WBC 6.1 07/25/2022   HGB 14.2 07/25/2022   HCT 43.1 07/25/2022   MCV 90 07/25/2022   PLT 264 07/25/2022   Lab Results  Component Value Date   ALT 23 01/29/2023   AST 20 01/29/2023   ALKPHOS 63 01/29/2023   BILITOT 0.5 01/29/2023   Lab Results  Component Value Date   VD25OH 19.3 (L) 07/25/2022     Review of Systems  Constitutional:  Negative for chills and fever.  Gastrointestinal:  Negative for abdominal pain and blood in stool.  Genitourinary:  Positive for vaginal discharge and vaginal pain. Negative for dysuria, flank pain, frequency, hematuria, pelvic pain and urgency.    Patient Active Problem List   Diagnosis Date Noted   Restless leg syndrome 10/24/2022   Primary insomnia 10/24/2022   Vitamin D deficiency 07/25/2022   Heart palpitations 02/15/2022   Aortic atherosclerosis (HCC) 07/15/2019   Facial basal cell cancer 10/31/2016   Migraine 10/31/2016   Lipoma of left upper extremity 10/11/2015   Type II diabetes mellitus with complication (HCC) 02/11/2015   Ovarian failure 11/10/2014   Essential (primary) hypertension 07/15/2014   Hyperlipidemia associated with type 2 diabetes mellitus (HCC) 07/15/2014   Tricompartment osteoarthritis of knee 07/15/2014    Allergies  Allergen Reactions   Glipizide Swelling    hives   Latex    Codeine Hives    Past Surgical History:  Procedure Laterality Date   ABDOMINAL HYSTERECTOMY     BASAL CELL CARCINOMA EXCISION     CARPAL TUNNEL RELEASE  Bilateral    CARPAL TUNNEL RELEASE     CATARACT EXTRACTION W/PHACO Left 09/07/2016   Procedure: CATARACT EXTRACTION PHACO AND INTRAOCULAR LENS PLACEMENT (IOC);  Surgeon: Nevada Crane, MD;  Location: [facility] ORS;  Service: Ophthalmology;  Laterality: Left;  [place] 00:41.9 AP% 14.9 CDE 6.23 Fluid pack lot # [id number]   CATARACT EXTRACTION W/PHACO Right 10/19/2016   Procedure: CATARACT EXTRACTION PHACO AND INTRAOCULAR LENS PLACEMENT (IOC);  Surgeon: Nevada Crane, MD;   Location: [facility] ORS;  Service: Ophthalmology;  Laterality: Right;  casette lot # [id number] [place]  00:59.5 AP%   16.5 CDE   9.79    COLONOSCOPY  06/2012   normal (in [place])   EYE SURGERY     TONSILLECTOMY     TUBAL LIGATION      Social History   Tobacco Use   Smoking status: Never   Smokeless tobacco: Never   Tobacco comments:    smoking cessation materials not required  Vaping Use   Vaping status: Never Used  Substance Use Topics   Alcohol use: Not Currently    Alcohol/week: 1.0 standard drink of alcohol    Types: 1 Glasses of wine per week    Comment: once monthly maybe   Drug use: No     Medication list has been reviewed and updated.  Current Meds  Medication Sig   Acetaminophen (TYLENOL ARTHRITIS PAIN PO) Take 1 capsule by mouth 2 (two) times daily.   aspirin 81 MG chewable tablet Chew by mouth daily.   cetirizine (ZYRTEC) 10 MG tablet Take 10 mg by mouth as needed.    empagliflozin (JARDIANCE) 25 MG TABS tablet Take 1 tablet (25 mg total) by mouth daily.   glucose blood test strip    lisinopril-hydrochlorothiazide (ZESTORETIC) 20-12.5 MG tablet Take 1 tablet by mouth daily.   metFORMIN (GLUCOPHAGE) 1000 MG tablet Take 1 tablet (1,000 mg total) by mouth 2 (two) times daily.   naproxen sodium (ANAPROX) 220 MG tablet Take 220 mg by mouth 2 (two) times daily with a meal. PRN   NON FORMULARY CBD ointment   Pyridoxine HCl (VITAMIN B6) 200 MG TABS Take 100 mg by mouth 2 (two) times daily.   rOPINIRole (REQUIP) 0.25 MG tablet TAKE 1 TABLET (0.25 MG TOTAL) BY MOUTH AT BEDTIME AS NEEDED. FOR RESTLESS LEG SYMPTOMS   simvastatin (ZOCOR) 20 MG tablet Take 1 tablet (20 mg total) by mouth daily.   temazepam (RESTORIL) 15 MG capsule Take 1 capsule (15 mg total) by mouth at bedtime as needed for sleep.   Turmeric (QC TUMERIC COMPLEX PO) Take by mouth.       04/13/2023   10:57 AM 01/29/2023    1:27 PM 07/25/2022    9:55 AM 02/15/2022    2:54 PM  GAD 7 : Generalized Anxiety Score   Nervous, Anxious, on Edge 0 0 0 1  Control/stop worrying 0 0 0 1  Worry too much - different things 0 0 0 1  Trouble relaxing 0 0 0 0  Restless 0 0 0 0  Easily annoyed or irritable 0 0 0 0  Afraid - awful might happen 0 0 0 0  Total GAD 7 Score 0 0 0 3  Anxiety Difficulty Not difficult at all Not difficult at all Not difficult at all Not difficult at all       04/13/2023   10:57 AM 01/29/2023    1:27 PM 07/26/2022   11:37 AM  Depression screen PHQ 2/9  Decreased Interest 0  0 0  Down, Depressed, Hopeless 0 0 0  PHQ - 2 Score 0 0 0  Altered sleeping 0 1 0  Tired, decreased energy 0 1 0  Change in appetite 0 0 0  Feeling bad or failure about yourself  0 0 0  Trouble concentrating 0 0 0  Moving slowly or fidgety/restless 0 0 0  Suicidal thoughts 0 0 0  PHQ-9 Score 0 2 0  Difficult doing work/chores Not difficult at all Not difficult at all Not difficult at all    BP Readings from Last 3 Encounters:  04/13/23 114/76  01/29/23 116/70  10/24/22 112/74    Physical Exam Vitals and nursing note reviewed. Exam conducted with a chaperone present.  Cardiovascular:     Rate and Rhythm: Normal rate and regular rhythm.     Heart sounds: No murmur heard.    No friction rub. No gallop.  Pulmonary:     Breath sounds: No wheezing, rhonchi or rales.  Abdominal:     Tenderness: There is no abdominal tenderness. There is no right CVA tenderness or left CVA tenderness.  Genitourinary:    Exam position: Lithotomy position.     Labia:        Right: Rash and tenderness present.        Left: Rash and tenderness present.      Vagina: Vaginal discharge, erythema and tenderness present. No prolapsed vaginal walls.     Comments: Diffuse erythematous macular swelling of the palpable vaginal with thick yellow discharge.    Wt Readings from Last 3 Encounters:  04/13/23 211 lb 12.8 oz (96.1 kg)  01/29/23 210 lb (95.3 kg)  10/24/22 212 lb (96.2 kg)    BP 114/76   Pulse 86   Ht 5\' 3"  (1.6 m)    Wt 211 lb 12.8 oz (96.1 kg)   SpO2 98%   BMI 37.52 kg/m   Assessment and Plan: 1. Vulvovaginitis (Primary) New onset.  Patient began having symptomatology for about 3 days which gradually has worsened.  Patient has developed more "heat" swelling and tenderness and discomfort over the course of the last 72 hours.  Examination notes diffuse erythema and swelling of her labia, vulva area and vaginal canal.  This is consistent with candidiasis and we will treat with Diflucan 200 mg 1 tablet followed by Mycostatin suppositories 1 nightly x 3 nights.  Patient will be rechecked on Monday  2. Yeast infection Patient also has a candidiasis infection of the skin folds where the pannus overlies in the abdominal area earlier this involved beneath the the breast but this has resolved on its own or with Vaseline.  I have asked her to stop the Vaseline and will use nystatin cream twice a day in this area until recheck.   3. Dysuria Patient did have dysuria there were some white cells this is likely contaminant we will send a culture to see if there is single organism bacterial overgrowth of the urine and treat accordingly. - Urine Culture  4. Long term current use of oral hypoglycemic drug Chronic.  Initially followed by [facility].  Has been on Jardiance for a year.  For the time being I have asked her to the Jardiance and metformin see above patient will return for reevaluation with Dr. Judithann Graves on Monday and determination of further treatment of diabetes will  5. Type II diabetes mel see above litus with complication (HCC) See above    Elizabeth Sauer, MD

## 2023-04-13 NOTE — Telephone Encounter (Signed)
 Copied from CRM [id number]. Topic: Clinical - Red Word Triage >> Apr 13, 2023  9:23 AM Fuller Mandril wrote: Red Word that prompted transfer to Nurse Triage: Pain burning private area - painful to sit  Chief Complaint: vaginal heat/burning Symptoms: see above Frequency: started 2 days ago Pertinent Negatives: Patient denies fever, injury, itching Disposition: [] ED /[] Urgent Care (no appt availability in office) / [x] Appointment(In office/virtual)/ []  [facility]/ [] Home Care/ [] Refused Recommended Disposition /[] [facility]/ []  Follow-up with PCP Additional Notes: per protocol apt made for today; care advice given, denies questions; instructed to go to ER if becomes worse.   Reason for Disposition  [1] MILD-MODERATE pain AND [2] present > 24 hours  (Exception: Chronic pain.)  Answer Assessment - Initial Assessment Questions 1. SYMPTOM: "What's the main symptom you're concerned about?" (e.g., pain, itching, dryness)     Pain and burning  2. LOCATION: "Where is the  burning located?" (e.g., inside/outside, left/right)     Two days ago, noticed heat first.  Can't tell if on the inside or outside 3. ONSET: "When did the  pain  start?"     Two days ago 4. PAIN: "Is there any pain?" If Yes, ask: "How bad is it?" (Scale: 1-10; mild, moderate, severe)   -  MILD (1-3): Doesn't interfere with normal activities.    -  MODERATE (4-7): Interferes with normal activities (e.g., work or school) or awakens from sleep.     -  SEVERE (8-10): Excruciating pain, unable to do any normal activities.     uncomfortable 5. ITCHING: "Is there any itching?" If Yes, ask: "How bad is it?" (Scale: 1-10; mild, moderate, severe)     denies 6. CAUSE: "What do you think is causing the discharge?" "Have you had the same problem before? What happened then?"     younger 7. OTHER SYMPTOMS: "Do you have any other symptoms?" (e.g., fever, itching, vaginal bleeding, pain with urination, injury to genital area,  vaginal foreign body)     no 8. PREGNANCY: "Is there any chance you are pregnant?" "When was your last menstrual period?"     na  Protocols used: Vaginal Symptoms-A-AH

## 2023-04-16 ENCOUNTER — Ambulatory Visit (INDEPENDENT_AMBULATORY_CARE_PROVIDER_SITE_OTHER): Admitting: Internal Medicine

## 2023-04-16 ENCOUNTER — Telehealth: Payer: Self-pay

## 2023-04-16 ENCOUNTER — Encounter: Payer: Self-pay | Admitting: Internal Medicine

## 2023-04-16 VITALS — BP 128/72 | HR 86 | Ht 63.0 in | Wt 209.2 lb

## 2023-04-16 DIAGNOSIS — E118 Type 2 diabetes mellitus with unspecified complications: Secondary | ICD-10-CM

## 2023-04-16 DIAGNOSIS — N760 Acute vaginitis: Secondary | ICD-10-CM | POA: Diagnosis not present

## 2023-04-16 DIAGNOSIS — Z7984 Long term (current) use of oral hypoglycemic drugs: Secondary | ICD-10-CM | POA: Diagnosis not present

## 2023-04-16 MED ORDER — FLUCONAZOLE 100 MG PO TABS: 100.0000 mg | ORAL_TABLET | Freq: Every day | ORAL | 0 refills | Status: AC

## 2023-04-16 MED ORDER — TIRZEPATIDE 2.5 MG/0.5ML ~~LOC~~ SOAJ: 2.5000 mg | SUBCUTANEOUS | 0 refills | Status: DC

## 2023-04-16 NOTE — Patient Instructions (Addendum)
 Stop Jardiance.  Continue MTF.  Hold simvastatin while taking Diflucan.  Start Mounjaro 2.5 mg weekly for 4 weeks then call me for the next strength of 5 mg.

## 2023-04-16 NOTE — Assessment & Plan Note (Addendum)
 Severe yeast infection from jardiance Will continue MTF but stop Jardiance Add [employer]

## 2023-04-16 NOTE — Telephone Encounter (Signed)
 Copied from CRM [id number]. Topic: Clinical - Prescription Issue >> Apr 16, 2023  2:58 PM Elle L wrote: Reason for CRM: [facility] called on behalf fo the patient to advise that fluconazole (DIFLUCAN) 100 MG tablet medication that was prescribed today and the patient's cholesterol medication simvastatin (ZOCOR) 20 MG tablet have an interaction together and are requesting to see if they should hold the cholesterol prescription.

## 2023-04-16 NOTE — Progress Notes (Signed)
 Date:  04/16/2023   Name:  Nancy Chen   DOB:  11-09-1945   MRN:  [id number]   Chief Complaint: No chief complaint on file.  Diabetes She presents for her follow-up diabetic visit. She has type 2 diabetes mellitus. Her disease course has been stable. Pertinent negatives for hypoglycemia include no dizziness. Pertinent negatives for diabetes include no fatigue. Current diabetic treatments: MTF and Jardiance. She is compliant with treatment all of the time (but developed severe yeast from [place]).  Vaginal Itching The patient's primary symptoms include genital itching and vaginal discharge. This is a new problem. The current episode started 1 to 4 weeks ago. The problem occurs constantly. The problem has been gradually improving. The problem affects both sides. Associated symptoms include frequency and urgency. Pertinent negatives include no chills.    Review of Systems  Constitutional:  Negative for chills and fatigue.  Respiratory:  Negative for chest tightness and shortness of breath.   Genitourinary:  Positive for frequency, urgency and vaginal discharge.  Neurological:  Negative for dizziness.     Lab Results  Component Value Date   NA 140 01/29/2023   K 4.1 01/29/2023   CO2 24 01/29/2023   GLUCOSE 139 (H) 01/29/2023   BUN 16 01/29/2023   CREATININE 0.78 01/29/2023   CALCIUM 10.6 (H) 01/29/2023   EGFR 79 01/29/2023   GFRNONAA 80 11/06/2019   Lab Results  Component Value Date   CHOL 178 07/25/2022   HDL 43 07/25/2022   LDLCALC 88 07/25/2022   TRIG 282 (H) 07/25/2022   CHOLHDL 4.1 07/25/2022   Lab Results  Component Value Date   TSH 1.410 07/25/2022   Lab Results  Component Value Date   HGBA1C 7.8 (H) 01/29/2023   Lab Results  Component Value Date   WBC 6.1 07/25/2022   HGB 14.2 07/25/2022   HCT 43.1 07/25/2022   MCV 90 07/25/2022   PLT 264 07/25/2022   Lab Results  Component Value Date   ALT 23 01/29/2023   AST 20 01/29/2023   ALKPHOS 63  01/29/2023   BILITOT 0.5 01/29/2023   Lab Results  Component Value Date   VD25OH 19.3 (L) 07/25/2022     Patient Active Problem List   Diagnosis Date Noted   Restless leg syndrome 10/24/2022   Primary insomnia 10/24/2022   Vitamin D deficiency 07/25/2022   Heart palpitations 02/15/2022   Aortic atherosclerosis (HCC) 07/15/2019   Facial basal cell cancer 10/31/2016   Migraine 10/31/2016   Lipoma of left upper extremity 10/11/2015   Type II diabetes mellitus with complication (HCC) 02/11/2015   Ovarian failure 11/10/2014   Essential (primary) hypertension 07/15/2014   Hyperlipidemia associated with type 2 diabetes mellitus (HCC) 07/15/2014   Tricompartment osteoarthritis of knee 07/15/2014    Allergies  Allergen Reactions   Glipizide Swelling    hives   Latex    Codeine Hives    Past Surgical History:  Procedure Laterality Date   ABDOMINAL HYSTERECTOMY     BASAL CELL CARCINOMA EXCISION     CARPAL TUNNEL RELEASE Bilateral    CARPAL TUNNEL RELEASE     CATARACT EXTRACTION W/PHACO Left 09/07/2016   Procedure: CATARACT EXTRACTION PHACO AND INTRAOCULAR LENS PLACEMENT (IOC);  Surgeon: Nevada Crane, MD;  Location: [facility];  Service: Ophthalmology;  Laterality: Left;  [place] 00:41.9 AP% 14.9 CDE 6.23 Fluid pack lot # [id number]   CATARACT EXTRACTION W/PHACO Right 10/19/2016   Procedure: CATARACT EXTRACTION PHACO AND INTRAOCULAR LENS PLACEMENT (IOC);  Surgeon: Nevada Crane, MD;  Location: [facility] ORS;  Service: Ophthalmology;  Laterality: Right;  casette lot # [id number] H [place]  00:59.5 AP%   16.5 CDE   9.79    COLONOSCOPY  06/2012   normal (in [place])   EYE SURGERY     TONSILLECTOMY     TUBAL LIGATION      Social History   Tobacco Use   Smoking status: Never   Smokeless tobacco: Never   Tobacco comments:    smoking cessation materials not required  Vaping Use   Vaping status: Never Used  Substance Use Topics   Alcohol use: Not Currently    Alcohol/week: 1.0 standard  drink of alcohol    Types: 1 Glasses of wine per week    Comment: once monthly maybe   Drug use: No     Medication list has been reviewed and updated.  Current Meds  Medication Sig   Acetaminophen (TYLENOL ARTHRITIS PAIN PO) Take 1 capsule by mouth 2 (two) times daily.   aspirin 81 MG chewable tablet Chew by mouth daily.   cetirizine (ZYRTEC) 10 MG tablet Take 10 mg by mouth as needed.    Cholecalciferol (D3-1000) 25 MCG (1000 UT) capsule    Cyanocobalamin (B-12) 1000 MCG CAPS    fluconazole (DIFLUCAN) 100 MG tablet Take 1 tablet (100 mg total) by mouth daily for 7 days.   glucose blood test strip    lisinopril-hydrochlorothiazide (ZESTORETIC) 20-12.5 MG tablet Take 1 tablet by mouth daily.   metFORMIN (GLUCOPHAGE) 1000 MG tablet Take 1 tablet (1,000 mg total) by mouth 2 (two) times daily.   miconazole (MICOTIN) 200 MG vaginal suppository Place 1 suppository (200 mg total) vaginally at bedtime.   naproxen sodium (ANAPROX) 220 MG tablet Take 220 mg by mouth 2 (two) times daily with a meal. PRN   NON FORMULARY CBD ointment   nystatin cream (MYCOSTATIN) Apply 1 Application topically 2 (two) times daily. Apply in skin folds twice a day   Pyridoxine HCl (VITAMIN B6) 200 MG TABS Take 100 mg by mouth 2 (two) times daily.   rOPINIRole (REQUIP) 0.25 MG tablet TAKE 1 TABLET (0.25 MG TOTAL) BY MOUTH AT BEDTIME AS NEEDED. FOR RESTLESS LEG SYMPTOMS   simvastatin (ZOCOR) 20 MG tablet Take 1 tablet (20 mg total) by mouth daily.   temazepam (RESTORIL) 15 MG capsule Take 1 capsule (15 mg total) by mouth at bedtime as needed for sleep.   tirzepatide (MOUNJARO) 2.5 MG/0.5ML Pen Inject 2.5 mg into the skin once a week.   Turmeric (QC TUMERIC COMPLEX PO) Take by mouth.   [DISCONTINUED] fluconazole (DIFLUCAN) 200 MG tablet Take 1 tablet (200 mg total) by mouth daily.       04/13/2023   10:57 AM 01/29/2023    1:27 PM 07/25/2022    9:55 AM 02/15/2022    2:54 PM  GAD 7 : Generalized Anxiety Score  Nervous,  Anxious, on Edge 0 0 0 1  Control/stop worrying 0 0 0 1  Worry too much - different things 0 0 0 1  Trouble relaxing 0 0 0 0  Restless 0 0 0 0  Easily annoyed or irritable 0 0 0 0  Afraid - awful might happen 0 0 0 0  Total GAD 7 Score 0 0 0 3  Anxiety Difficulty Not difficult at all Not difficult at all Not difficult at all Not difficult at all       04/13/2023   10:57 AM 01/29/2023  1:27 PM 07/26/2022   11:37 AM  Depression screen PHQ 2/9  Decreased Interest 0 0 0  Down, Depressed, Hopeless 0 0 0  PHQ - 2 Score 0 0 0  Altered sleeping 0 1 0  Tired, decreased energy 0 1 0  Change in appetite 0 0 0  Feeling bad or failure about yourself  0 0 0  Trouble concentrating 0 0 0  Moving slowly or fidgety/restless 0 0 0  Suicidal thoughts 0 0 0  PHQ-9 Score 0 2 0  Difficult doing work/chores Not difficult at all Not difficult at all Not difficult at all    BP Readings from Last 3 Encounters:  04/16/23 128/72  04/13/23 114/76  01/29/23 116/70    Physical Exam Vitals and nursing note reviewed.  Constitutional:      General: She is not in acute distress.    Appearance: Normal appearance. She is well-developed.  HENT:     Head: Normocephalic and atraumatic.  Cardiovascular:     Rate and Rhythm: Normal rate and regular rhythm.  Pulmonary:     Effort: Pulmonary effort is normal. No respiratory distress.     Breath sounds: No wheezing or rhonchi.  Genitourinary:    Labia:        Right: Rash and tenderness present.        Left: Rash and tenderness present.      Comments: Thick yellow/white discharge externally Lymphadenopathy:     Cervical: No cervical adenopathy.  Skin:    General: Skin is warm and dry.     Findings: No rash.  Neurological:     Mental Status: She is alert and oriented to person, place, and time.  Psychiatric:        Mood and Affect: Mood normal.        Behavior: Behavior normal.     Wt Readings from Last 3 Encounters:  04/16/23 209 lb 4 oz (94.9 kg)   04/13/23 211 lb 12.8 oz (96.1 kg)  01/29/23 210 lb (95.3 kg)    BP 128/72   Pulse 86   Ht 5\' 3"  (1.6 m)   Wt 209 lb 4 oz (94.9 kg)   SpO2 97%   BMI 37.07 kg/m   Assessment and Plan:  Problem List Items Addressed This Visit       Unprioritized   Type II diabetes mellitus with complication (HCC) - Primary (Chronic)   Severe yeast infection from jardiance Will continue MTF but stop Jardiance Add Mounjaro      Relevant Medications   tirzepatide [facility]) 2.5 MG/0.5ML Pen   Other Visit Diagnoses       Vulvovaginitis       improved; additional 7 days of diflucan await aptiva swab   Relevant Medications   fluconazole (DIFLUCAN) 100 MG tablet     Long term current use of oral hypoglycemic drug           No follow-ups on file.    Reubin Milan, MD [facility] [place]

## 2023-04-17 LAB — SPECIMEN STATUS REPORT

## 2023-04-17 LAB — CERVICOVAGINAL ANCILLARY ONLY
Candida Glabrata: NEGATIVE
Candida Vaginitis: POSITIVE — AB
Comment: NEGATIVE
Comment: NEGATIVE

## 2023-04-17 LAB — URINE CULTURE

## 2023-04-17 NOTE — Telephone Encounter (Signed)
 Spoke with pharmacist at [facility] regarding message, she informed me that the patient told her that Dr. Judithann Graves had instructed her the day of the visit to hold off on the simvastatin while taking the diflucan. No further action needed.

## 2023-04-18 ENCOUNTER — Telehealth: Payer: Self-pay

## 2023-04-18 ENCOUNTER — Other Ambulatory Visit: Payer: Self-pay

## 2023-04-18 DIAGNOSIS — N3090 Cystitis, unspecified without hematuria: Secondary | ICD-10-CM

## 2023-04-18 MED ORDER — CEPHALEXIN 500 MG PO CAPS: 500.0000 mg | ORAL_CAPSULE | Freq: Three times a day (TID) | ORAL | 0 refills | Status: AC

## 2023-04-18 NOTE — Telephone Encounter (Signed)
 Rx sent to pharmacy. Patient has been made aware. Verbalized understanding.

## 2023-04-18 NOTE — Progress Notes (Signed)
 Patient has been made aware and she verbalized understanding.

## 2023-04-18 NOTE — Telephone Encounter (Signed)
-----   Message from Elizabeth Sauer sent at 04/18/2023 10:47 AM EDT ----- Vaginal prep is Positive for candidiasis.  Urine culture also grew out an E. coli so there is also a bladder infection that is asymptomatic and we will treat that with Keflex 3 times a day for 3 days.

## 2023-05-15 DIAGNOSIS — H26493 Other secondary cataract, bilateral: Secondary | ICD-10-CM | POA: Diagnosis not present

## 2023-05-15 DIAGNOSIS — H43813 Vitreous degeneration, bilateral: Secondary | ICD-10-CM | POA: Diagnosis not present

## 2023-05-15 DIAGNOSIS — E119 Type 2 diabetes mellitus without complications: Secondary | ICD-10-CM | POA: Diagnosis not present

## 2023-05-15 DIAGNOSIS — H353131 Nonexudative age-related macular degeneration, bilateral, early dry stage: Secondary | ICD-10-CM | POA: Diagnosis not present

## 2023-05-15 LAB — HM DIABETES EYE EXAM

## 2023-05-22 ENCOUNTER — Other Ambulatory Visit: Payer: Self-pay | Admitting: Internal Medicine

## 2023-05-22 DIAGNOSIS — E118 Type 2 diabetes mellitus with unspecified complications: Secondary | ICD-10-CM

## 2023-05-23 NOTE — Telephone Encounter (Signed)
 Please review

## 2023-05-23 NOTE — Telephone Encounter (Signed)
 No answer to phone call. Left VM message to have patient call back regarding her prescription.

## 2023-05-23 NOTE — Telephone Encounter (Signed)
 Requested medication (s) are due for refill today - yes  Requested medication (s) are on the active medication list -yes  Future visit scheduled -yes  Last refill: 04/16/23 2ml  Notes to clinic: off protocol- provider review   Requested Prescriptions  Pending Prescriptions Disp Refills   MOUNJARO 2.5 MG/0.5ML Pen [Pharmacy Med Name: Mounjaro 2.5 MG/0.5ML Subcutaneous Solution Pen-injector] 4 mL 0    Sig: INJECT 2.5MG  SUBCUTANEOUSLY  ONCE A WEEK     Off-Protocol Failed - 05/23/2023  8:53 AM      Failed - Medication not assigned to a protocol, review manually.      Passed - Valid encounter within last 12 months    Recent Outpatient Visits           1 month ago Type II diabetes mellitus with complication [facility])   [place] [facility] at [facility], Chales [place], MD   1 month ago Vulvovaginitis   [place] [facility] at [facility], [facility]       Future Appointments             In 6 days Sheron Dixons, MD [facility] at [facility], [facility]   In 2 months Gala Jubilee [place], MD [facility] at [facility], [facility]               Requested Prescriptions  Pending Prescriptions Disp Refills   MOUNJARO 2.5 MG/0.5ML Pen [Pharmacy Med Name: Mounjaro 2.5 MG/0.5ML Subcutaneous Solution Pen-injector] 4 mL 0    Sig: INJECT 2.5MG  SUBCUTANEOUSLY  ONCE A WEEK     Off-Protocol Failed - 05/23/2023  8:53 AM      Failed - Medication not assigned to a protocol, review manually.      Passed - Valid encounter within last 12 months    Recent Outpatient Visits           1 month ago Type II diabetes mellitus with complication [facility])   [place] [facility] at [facility], Chales [place], MD   1 month ago Vulvovaginitis   [place] [facility] at [facility], [facility]       Future  Appointments             In 6 days Gala Jubilee [place], MD [facility] at [facility]   In 2 months Gala Jubilee, Chales [place], MD [facility] Medicine at [facility], [facility]

## 2023-05-24 ENCOUNTER — Other Ambulatory Visit: Payer: Self-pay | Admitting: Internal Medicine

## 2023-05-24 ENCOUNTER — Telehealth: Payer: Self-pay

## 2023-05-24 DIAGNOSIS — E118 Type 2 diabetes mellitus with unspecified complications: Secondary | ICD-10-CM

## 2023-05-24 MED ORDER — TIRZEPATIDE 5 MG/0.5ML ~~LOC~~ SOAJ
5.0000 mg | SUBCUTANEOUS | 0 refills | Status: DC
Start: 2023-05-24 — End: 2023-06-18

## 2023-05-24 NOTE — Telephone Encounter (Signed)
 Spoke with patient regarding her request to refill Monjuaro. Dose increase is needed and patient agreed.

## 2023-05-24 NOTE — Telephone Encounter (Signed)
 Spoke with patient. She agrees to the dose increase. She stated she started taking the Mounjaro on April 30, 2023.

## 2023-05-24 NOTE — Telephone Encounter (Signed)
 Copied from CRM [id number]. Topic: General - Call Back - No Documentation >> May 24, 2023  9:58 AM Elle L wrote: Reason for CRM: The patient was returning a call from the office regarding her Mounjaro. I did not see this reflected in her chart but reached out to the office who advised they were assisting other patients at this time. The patient believes it may be regarding the start date for her Mounjaro which she states she started on 4/21.

## 2023-05-29 ENCOUNTER — Encounter: Payer: Self-pay | Admitting: Internal Medicine

## 2023-05-29 ENCOUNTER — Ambulatory Visit (INDEPENDENT_AMBULATORY_CARE_PROVIDER_SITE_OTHER): Payer: Self-pay | Admitting: Internal Medicine

## 2023-05-29 VITALS — BP 126/74 | HR 78 | Ht 63.0 in | Wt 205.4 lb

## 2023-05-29 DIAGNOSIS — Z7984 Long term (current) use of oral hypoglycemic drugs: Secondary | ICD-10-CM

## 2023-05-29 DIAGNOSIS — H6123 Impacted cerumen, bilateral: Secondary | ICD-10-CM | POA: Diagnosis not present

## 2023-05-29 DIAGNOSIS — G2581 Restless legs syndrome: Secondary | ICD-10-CM

## 2023-05-29 DIAGNOSIS — E118 Type 2 diabetes mellitus with unspecified complications: Secondary | ICD-10-CM

## 2023-05-29 DIAGNOSIS — I10 Essential (primary) hypertension: Secondary | ICD-10-CM

## 2023-05-29 LAB — POCT GLYCOSYLATED HEMOGLOBIN (HGB A1C): Hemoglobin A1C: 7 % — AB (ref 4.0–5.6)

## 2023-05-29 NOTE — Assessment & Plan Note (Signed)
 Blood sugars have been stable.  No recent hypoglycemic events requiring assistance. Currently medications are MTF and Mounjaro.  Just started the 5 mg dose. Lab Results  Component Value Date   HGBA1C 7.0 (A) 05/29/2023   Last visit stopped Jardiance  and started Mounjaro. A1C today down from 7.8.

## 2023-05-29 NOTE — Assessment & Plan Note (Signed)
 BP well controlled.  Continue current regimen of lisinopril  hydrochlorothiazide .

## 2023-05-29 NOTE — Progress Notes (Signed)
 Date:  05/29/2023   Name:  Nancy Chen   DOB:  May 05, 1945   MRN:  [id number]   Chief Complaint: Diabetes and Ear Fullness (X 3- 4 days, no pain, pressure, not hearing well, popping noises)  Hypertension This is a chronic problem. The problem is controlled. Pertinent negatives include no chest pain, headaches or shortness of breath. Past treatments include ACE inhibitors and diuretics.  Diabetes She presents for her follow-up diabetic visit. She has type 2 diabetes mellitus. Her disease course has been improving. Pertinent negatives for hypoglycemia include no dizziness, headaches or nervousness/anxiousness. Pertinent negatives for diabetes include no chest pain and no fatigue. Current diabetic treatments: MTF and Mounjaro (started last visit)  Otalgia  There is pain in the right ear. This is a new problem. The current episode started in the past 7 days. There has been no fever. The patient is experiencing no pain. Associated symptoms include hearing loss. Pertinent negatives include no headaches.    Review of Systems  Constitutional:  Negative for chills, fatigue and fever.  HENT:  Positive for hearing loss. Negative for congestion and ear pain.   Respiratory:  Negative for chest tightness, shortness of breath and wheezing.   Cardiovascular:  Negative for chest pain.  Neurological:  Negative for dizziness and headaches.  Psychiatric/Behavioral:  Negative for dysphoric mood and sleep disturbance. The patient is not nervous/anxious.      Lab Results  Component Value Date   NA 140 01/29/2023   K 4.1 01/29/2023   CO2 24 01/29/2023   GLUCOSE 139 (H) 01/29/2023   BUN 16 01/29/2023   CREATININE 0.78 01/29/2023   CALCIUM 10.6 (H) 01/29/2023   EGFR 79 01/29/2023   GFRNONAA 80 11/06/2019   Lab Results  Component Value Date   CHOL 178 07/25/2022   HDL 43 07/25/2022   LDLCALC 88 07/25/2022   TRIG 282 (H) 07/25/2022   CHOLHDL 4.1 07/25/2022   Lab Results  Component Value Date    TSH 1.410 07/25/2022   Lab Results  Component Value Date   HGBA1C 7.0 (A) 05/29/2023   Lab Results  Component Value Date   WBC 6.1 07/25/2022   HGB 14.2 07/25/2022   HCT 43.1 07/25/2022   MCV 90 07/25/2022   PLT 264 07/25/2022   Lab Results  Component Value Date   ALT 23 01/29/2023   AST 20 01/29/2023   ALKPHOS 63 01/29/2023   BILITOT 0.5 01/29/2023   Lab Results  Component Value Date   VD25OH 19.3 (L) 07/25/2022     Patient Active Problem List   Diagnosis Date Noted   Restless leg syndrome 10/24/2022   Primary insomnia 10/24/2022   Vitamin D  deficiency 07/25/2022   Heart palpitations 02/15/2022   Aortic atherosclerosis (HCC) 07/15/2019   Facial basal cell cancer 10/31/2016   Migraine 10/31/2016   Lipoma of left upper extremity 10/11/2015   Type II diabetes mellitus with complication (HCC) 02/11/2015   Ovarian failure 11/10/2014   Essential (primary) hypertension 07/15/2014   Hyperlipidemia associated with type 2 diabetes mellitus (HCC) 07/15/2014   Tricompartment osteoarthritis of knee 07/15/2014    Allergies  Allergen Reactions   Glipizide Swelling    hives   Latex    Codeine Hives    Past Surgical History:  Procedure Laterality Date   ABDOMINAL HYSTERECTOMY     BASAL CELL CARCINOMA EXCISION     CARPAL TUNNEL RELEASE Bilateral    CARPAL TUNNEL RELEASE     CATARACT EXTRACTION W/PHACO Left  09/07/2016   Procedure: CATARACT EXTRACTION PHACO AND INTRAOCULAR LENS PLACEMENT (IOC);  Surgeon: Rosa College, MD;  Location: [facility] ORS;  Service: Ophthalmology;  Laterality: Left;  US  00:41.9 AP% 14.9 CDE 6.23 Fluid pack lot # [id number]   CATARACT EXTRACTION W/PHACO Right 10/19/2016   Procedure: CATARACT EXTRACTION PHACO AND INTRAOCULAR LENS PLACEMENT (IOC);  Surgeon: Rosa College, MD;  Location: [facility] ORS;  Service: Ophthalmology;  Laterality: Right;  casette lot # [id number] H US   00:59.5 AP%   16.5 CDE   9.79    COLONOSCOPY  06/2012   normal (in [place])    EYE SURGERY     TONSILLECTOMY     TUBAL LIGATION      Social History   Tobacco Use   Smoking status: Never   Smokeless tobacco: Never   Tobacco comments:    smoking cessation materials not required  Vaping Use   Vaping status: Never Used  Substance Use Topics   Alcohol use: Not Currently    Alcohol/week: 1.0 standard drink of alcohol    Types: 1 Glasses of wine per week    Comment: once monthly maybe   Drug use: No     Medication list has been reviewed and updated.  Current Meds  Medication Sig   Acetaminophen (TYLENOL ARTHRITIS PAIN PO) Take 1 capsule by mouth 2 (two) times daily.   aspirin 81 MG chewable tablet Chew by mouth daily.   cetirizine (ZYRTEC) 10 MG tablet Take 10 mg by mouth as needed.    Cholecalciferol (D3-1000) 25 MCG (1000 UT) capsule    Cyanocobalamin  (B-12) 1000 MCG CAPS    glucose blood test strip    lisinopril -hydrochlorothiazide  (ZESTORETIC ) 20-12.5 MG tablet Take 1 tablet by mouth daily.   metFORMIN  (GLUCOPHAGE ) 1000 MG tablet Take 1 tablet (1,000 mg total) by mouth 2 (two) times daily.   naproxen sodium (ANAPROX) 220 MG tablet Take 220 mg by mouth 2 (two) times daily with a meal. PRN   Pyridoxine HCl (VITAMIN B6) 200 MG TABS Take 100 mg by mouth 2 (two) times daily.   rOPINIRole  (REQUIP ) 0.25 MG tablet TAKE 1 TABLET (0.25 MG TOTAL) BY MOUTH AT BEDTIME AS NEEDED. FOR RESTLESS LEG SYMPTOMS   simvastatin  (ZOCOR ) 20 MG tablet Take 1 tablet (20 mg total) by mouth daily.   tirzepatide (MOUNJARO) 5 MG/0.5ML Pen Inject 5 mg into the skin once a week.       04/13/2023   10:57 AM 01/29/2023    1:27 PM 07/25/2022    9:55 AM 02/15/2022    2:54 PM  GAD 7 : Generalized Anxiety Score  Nervous, Anxious, on Edge 0 0 0 1  Control/stop worrying 0 0 0 1  Worry too much - different things 0 0 0 1  Trouble relaxing 0 0 0 0  Restless 0 0 0 0  Easily annoyed or irritable 0 0 0 0  Afraid - awful might happen 0 0 0 0  Total GAD 7 Score 0 0 0 3  Anxiety Difficulty  Not difficult at all Not difficult at all Not difficult at all Not difficult at all       04/13/2023   10:57 AM 01/29/2023    1:27 PM 07/26/2022   11:37 AM  Depression screen PHQ 2/9  Decreased Interest 0 0 0  Down, Depressed, Hopeless 0 0 0  PHQ - 2 Score 0 0 0  Altered sleeping 0 1 0  Tired, decreased energy 0 1 0  Change in appetite 0  0 0  Feeling bad or failure about yourself  0 0 0  Trouble concentrating 0 0 0  Moving slowly or fidgety/restless 0 0 0  Suicidal thoughts 0 0 0  PHQ-9 Score 0 2 0  Difficult doing work/chores Not difficult at all Not difficult at all Not difficult at all    BP Readings from Last 3 Encounters:  05/29/23 126/74  04/16/23 128/72  04/13/23 114/76    Physical Exam Constitutional:      Appearance: Normal appearance.  HENT:     Right Ear: There is impacted cerumen.     Left Ear: There is impacted cerumen.  Cardiovascular:     Rate and Rhythm: Normal rate and regular rhythm.     Heart sounds: No murmur heard. Pulmonary:     Effort: Pulmonary effort is normal.     Breath sounds: Normal breath sounds. No wheezing.  Musculoskeletal:     Cervical back: Normal range of motion.  Lymphadenopathy:     Cervical: No cervical adenopathy.  Skin:    General: Skin is warm and dry.  Neurological:     Mental Status: She is alert.     Gait: Gait abnormal (uses rolator).  Psychiatric:        Attention and Perception: Attention normal.        Mood and Affect: Mood normal.     Wt Readings from Last 3 Encounters:  05/29/23 205 lb 6 oz (93.2 kg)  04/16/23 209 lb 4 oz (94.9 kg)  04/13/23 211 lb 12.8 oz (96.1 kg)    BP 126/74   Pulse 78   Ht 5\' 3"  (1.6 m)   Wt 205 lb 6 oz (93.2 kg)   SpO2 96%   BMI 36.38 kg/m   Assessment and Plan:  Problem List Items Addressed This Visit       Unprioritized   Essential (primary) hypertension - Primary (Chronic)   BP well controlled.  Continue current regimen of lisinopril  hydrochlorothiazide .      Type  II diabetes mellitus with complication (HCC) (Chronic)   Blood sugars have been stable.  No recent hypoglycemic events requiring assistance. Currently medications are MTF and Mounjaro.  Just started the 5 mg dose. Lab Results  Component Value Date   HGBA1C 7.0 (A) 05/29/2023   Last visit stopped Jardiance  and started Mounjaro. A1C today down from 7.8.       Relevant Orders   POCT glycosylated hemoglobin (Hb A1C) (Completed)   Restless leg syndrome (Chronic)   Now taking Requip  0.25 gm nightly with excellent response      Other Visit Diagnoses       Excessive cerumen in both ear canals       currently minimally symptomatic recommend ENT for cleaning.     Long term current use of oral hypoglycemic drug           Return in about 4 months (around 09/29/2023) for DM, HTN.    Sheron Dixons, MD [facility] [place]

## 2023-05-29 NOTE — Assessment & Plan Note (Signed)
 Now taking Requip  0.25 gm nightly with excellent response

## 2023-06-01 DIAGNOSIS — H6123 Impacted cerumen, bilateral: Secondary | ICD-10-CM | POA: Diagnosis not present

## 2023-06-01 DIAGNOSIS — H903 Sensorineural hearing loss, bilateral: Secondary | ICD-10-CM | POA: Diagnosis not present

## 2023-06-01 DIAGNOSIS — H6983 Other specified disorders of Eustachian tube, bilateral: Secondary | ICD-10-CM | POA: Diagnosis not present

## 2023-06-18 ENCOUNTER — Other Ambulatory Visit: Payer: Self-pay | Admitting: Internal Medicine

## 2023-06-18 DIAGNOSIS — E118 Type 2 diabetes mellitus with unspecified complications: Secondary | ICD-10-CM

## 2023-06-18 MED ORDER — TIRZEPATIDE 7.5 MG/0.5ML ~~LOC~~ SOAJ
7.5000 mg | SUBCUTANEOUS | 0 refills | Status: DC
Start: 2023-06-18 — End: 2023-07-16

## 2023-06-18 NOTE — Telephone Encounter (Signed)
Please review medication refill  request

## 2023-06-18 NOTE — Telephone Encounter (Unsigned)
 Copied from CRM [id number]. Topic: Clinical - Medication Refill >> Jun 18, 2023 10:31 AM Carlatta H wrote: Medication: tirzepatide (MOUNJARO) 5 MG/0.5ML Pen  Has the patient contacted their pharmacy? No (Agent: If no, request that the patient contact the pharmacy for the refill. If patient does not wish to contact the pharmacy document the reason why and proceed with request.) (Agent: If yes, when and what did the pharmacy advise?)a  Dosage needs to go up  This is the patient's preferred pharmacy:  [facility] [address] [place] - [address] [place] [address] Phone: [phone] Fax: [phone]  Is this the correct pharmacy for this prescription? Yes If no, delete pharmacy and type the correct one.   Has the prescription been filled recently? No  Is the patient out of the medication? Yes  Has the patient been seen for an appointment in the last year OR does the patient have an upcoming appointment? Yes  Can we respond through MyChart? Yes  Agent: Please be advised that Rx refills may take up to 3 business days. We ask that you follow-up with your pharmacy.

## 2023-06-18 NOTE — Progress Notes (Unsigned)
 Date:  06/18/2023   Name:  Nancy Chen   DOB:  [birth year]   MRN:  [id number]   Chief Complaint: No chief complaint on file.  HPI  Review of Systems   Lab Results  Component Value Date   NA 140 01/29/2023   K 4.1 01/29/2023   CO2 24 01/29/2023   GLUCOSE 139 (H) 01/29/2023   BUN 16 01/29/2023   CREATININE 0.78 01/29/2023   CALCIUM 10.6 (H) 01/29/2023   EGFR 79 01/29/2023   GFRNONAA 80 11/06/2019   Lab Results  Component Value Date   CHOL 178 07/25/2022   HDL 43 07/25/2022   LDLCALC 88 07/25/2022   TRIG 282 (H) 07/25/2022   CHOLHDL 4.1 07/25/2022   Lab Results  Component Value Date   TSH 1.410 07/25/2022   Lab Results  Component Value Date   HGBA1C 7.0 (A) 05/29/2023   Lab Results  Component Value Date   WBC 6.1 07/25/2022   HGB 14.2 07/25/2022   HCT 43.1 07/25/2022   MCV 90 07/25/2022   PLT 264 07/25/2022   Lab Results  Component Value Date   ALT 23 01/29/2023   AST 20 01/29/2023   ALKPHOS 63 01/29/2023   BILITOT 0.5 01/29/2023   Lab Results  Component Value Date   VD25OH 19.3 (L) 07/25/2022     Patient Active Problem List   Diagnosis Date Noted   Restless leg syndrome 10/24/2022   Primary insomnia 10/24/2022   Vitamin D  deficiency 07/25/2022   Heart palpitations 02/15/2022   Aortic atherosclerosis (HCC) 07/15/2019   Facial basal cell cancer 10/31/2016   Migraine 10/31/2016   Lipoma of left upper extremity 10/11/2015   Type II diabetes mellitus with complication (HCC) 02/11/2015   Ovarian failure 11/10/2014   Essential (primary) hypertension 07/15/2014   Hyperlipidemia associated with type 2 diabetes mellitus (HCC) 07/15/2014   Tricompartment osteoarthritis of knee 07/15/2014    Allergies  Allergen Reactions   Glipizide Swelling    hives   Latex    Codeine Hives    Past Surgical History:  Procedure Laterality Date   ABDOMINAL HYSTERECTOMY     BASAL CELL CARCINOMA EXCISION     CARPAL TUNNEL RELEASE Bilateral    CARPAL TUNNEL  RELEASE     CATARACT EXTRACTION W/PHACO Left 09/07/2016   Procedure: CATARACT EXTRACTION PHACO AND INTRAOCULAR LENS PLACEMENT (IOC);  Surgeon: Rosa College, MD;  Location: [facility] ORS;  Service: Ophthalmology;  Laterality: Left;  US  00:41.9 AP% 14.9 CDE 6.23 Fluid pack lot # [id number] H   CATARACT EXTRACTION W/PHACO Right 10/19/2016   Procedure: CATARACT EXTRACTION PHACO AND INTRAOCULAR LENS PLACEMENT (IOC);  Surgeon: Rosa College, MD;  Location: [facility] ORS;  Service: Ophthalmology;  Laterality: Right;  casette lot # [id number] H US   00:59.5 AP%   16.5 CDE   9.79    COLONOSCOPY  06/2012   normal (in [place])   EYE SURGERY     TONSILLECTOMY     TUBAL LIGATION      Social History   Tobacco Use   Smoking status: Never   Smokeless tobacco: Never   Tobacco comments:    smoking cessation materials not required  Vaping Use   Vaping status: Never Used  Substance Use Topics   Alcohol use: Not Currently    Alcohol/week: 1.0 standard drink of alcohol    Types: 1 Glasses of wine per week    Comment: once monthly maybe   Drug use: No     Medication list  has been reviewed and updated.  No outpatient medications have been marked as taking for the 06/18/23 encounter (Orders Only) with Sheron Dixons, MD.       05/29/2023   11:46 AM 04/13/2023   10:57 AM 01/29/2023    1:27 PM 07/25/2022    9:55 AM  GAD 7 : Generalized Anxiety Score  Nervous, Anxious, on Edge 0 0 0 0  Control/stop worrying 0 0 0 0  Worry too much - different things 0 0 0 0  Trouble relaxing 0 0 0 0  Restless 0 0 0 0  Easily annoyed or irritable 0 0 0 0  Afraid - awful might happen 0 0 0 0  Total GAD 7 Score 0 0 0 0  Anxiety Difficulty Not difficult at all Not difficult at all Not difficult at all Not difficult at all       05/29/2023   11:45 AM 04/13/2023   10:57 AM 01/29/2023    1:27 PM  Depression screen PHQ 2/9  Decreased Interest 0 0 0  Down, Depressed, Hopeless 0 0 0  PHQ - 2 Score 0 0 0  Altered sleeping 1  0 1  Tired, decreased energy 0 0 1  Change in appetite 0 0 0  Feeling bad or failure about yourself  0 0 0  Trouble concentrating 0 0 0  Moving slowly or fidgety/restless 0 0 0  Suicidal thoughts 0 0 0  PHQ-9 Score 1 0 2  Difficult doing work/chores Not difficult at all Not difficult at all Not difficult at all    BP Readings from Last 3 Encounters:  05/29/23 126/74  04/16/23 128/72  04/13/23 114/76    Physical Exam  Wt Readings from Last 3 Encounters:  05/29/23 205 lb 6 oz (93.2 kg)  04/16/23 209 lb 4 oz (94.9 kg)  04/13/23 211 lb 12.8 oz (96.1 kg)    There were no vitals taken for this visit.  Assessment and Plan:  Problem List Items Addressed This Visit   None   No follow-ups on file.    Sheron Dixons, MD [facility] [place]

## 2023-07-16 ENCOUNTER — Other Ambulatory Visit: Payer: Self-pay | Admitting: Internal Medicine

## 2023-07-16 DIAGNOSIS — E118 Type 2 diabetes mellitus with unspecified complications: Secondary | ICD-10-CM

## 2023-07-16 NOTE — Telephone Encounter (Unsigned)
 Copied from CRM [id number]. Topic: Clinical - Medication Refill >> Jul 16, 2023  1:47 PM Carlatta H wrote: Medication: tirzepatide  (MOUNJARO ) 7.5 MG/0.5ML Pen  Has the patient contacted their pharmacy? No (Agent: If no, request that the patient contact the pharmacy for the refill. If patient does not wish to contact the pharmacy document the reason why and proceed with request.) (Agent: If yes, when and what did the pharmacy advise?)  This is the patient's preferred pharmacy:  [facility] [address] [place] - [address] [place] [address] Phone: [phone] Fax: [phone]  Is this the correct pharmacy for this prescription? Yes If no, delete pharmacy and type the correct one.   Has the prescription been filled recently? Yes  Is the patient out of the medication? Yes  Has the patient been seen for an appointment in the last year OR does the patient have an upcoming appointment? Yes  Can we respond through MyChart? Yes  Agent: Please be advised that Rx refills may take up to 3 business days. We ask that you follow-up with your pharmacy.

## 2023-07-18 NOTE — Telephone Encounter (Signed)
 Requested medication (s) are due for refill today - yes  Requested medication (s) are on the active medication list -yes  Future visit scheduled -yes  Last refill: 06/18/23 2ml  Notes to clinic: off protocol- provider review   Requested Prescriptions  Pending Prescriptions Disp Refills   tirzepatide  (MOUNJARO ) 7.5 MG/0.5ML Pen 2 mL 0    Sig: Inject 7.5 mg into the skin once a week.     Off-Protocol Failed - 07/18/2023  2:20 PM      Failed - Medication not assigned to a protocol, review manually.      Passed - Valid encounter within last 12 months    Recent Outpatient Visits           1 month ago Essential (primary) hypertension   [place] [facility] at [facility], Le[place] DEL, MD   3 months ago Type II diabetes mellitus with complication [facility])   [place] [facility] at [facility], Leita DEL, MD   3 months ago Vulvovaginitis   [place] [facility] at [facility] Cathryne JAYSON, MD       Future Appointments             In 1 week Justus Le[place] DEL, MD [facility] at [facility], [facility]               Requested Prescriptions  Pending Prescriptions Disp Refills   tirzepatide  (MOUNJARO ) 7.5 MG/0.5ML Pen 2 mL 0    Sig: Inject 7.5 mg into the skin once a week.     Off-Protocol Failed - 07/18/2023  2:20 PM      Failed - Medication not assigned to a protocol, review manually.      Passed - Valid encounter within last 12 months    Recent Outpatient Visits           1 month ago Essential (primary) hypertension   [place] [facility] at [facility], Leita DEL, MD   3 months ago Type II diabetes mellitus with complication [facility])   [place] [facility] Medicine at [facility], Leita DEL, MD   3 months ago Vulvovaginitis   [place] [facility] at [facility] Cathryne JAYSON, MD       Future Appointments             In 1 week Justus [place] DEL, MD [facility] Medicine at [facility], [facility]

## 2023-07-19 MED ORDER — TIRZEPATIDE 7.5 MG/0.5ML ~~LOC~~ SOAJ: 7.5000 mg | SUBCUTANEOUS | 0 refills | Status: DC

## 2023-07-20 ENCOUNTER — Other Ambulatory Visit: Payer: Self-pay | Admitting: Internal Medicine

## 2023-07-20 DIAGNOSIS — Z1231 Encounter for screening mammogram for malignant neoplasm of breast: Secondary | ICD-10-CM

## 2023-07-27 ENCOUNTER — Encounter: Payer: Self-pay | Admitting: Internal Medicine

## 2023-07-27 ENCOUNTER — Ambulatory Visit (INDEPENDENT_AMBULATORY_CARE_PROVIDER_SITE_OTHER): Payer: Self-pay | Admitting: Internal Medicine

## 2023-07-27 VITALS — BP 124/70 | HR 87 | Ht 63.0 in | Wt 194.0 lb

## 2023-07-27 DIAGNOSIS — I10 Essential (primary) hypertension: Secondary | ICD-10-CM

## 2023-07-27 DIAGNOSIS — E559 Vitamin D deficiency, unspecified: Secondary | ICD-10-CM

## 2023-07-27 DIAGNOSIS — E1169 Type 2 diabetes mellitus with other specified complication: Secondary | ICD-10-CM

## 2023-07-27 DIAGNOSIS — Z1231 Encounter for screening mammogram for malignant neoplasm of breast: Secondary | ICD-10-CM

## 2023-07-27 DIAGNOSIS — Z7985 Long-term (current) use of injectable non-insulin antidiabetic drugs: Secondary | ICD-10-CM | POA: Diagnosis not present

## 2023-07-27 DIAGNOSIS — E118 Type 2 diabetes mellitus with unspecified complications: Secondary | ICD-10-CM

## 2023-07-27 DIAGNOSIS — E785 Hyperlipidemia, unspecified: Secondary | ICD-10-CM | POA: Diagnosis not present

## 2023-07-27 NOTE — Assessment & Plan Note (Signed)
 LDL is  Lab Results  Component Value Date   LDLCALC 88 07/25/2022   Current regimen is simvastatin .  No medication side effects noted. Goal LDL is <70.

## 2023-07-27 NOTE — Progress Notes (Signed)
 Date:  07/27/2023   Name:  Nancy Chen   DOB:  June 23, 1945   MRN:  [id number]   Chief Complaint: Annual Exam (BS this morning was 106) Nancy Chen is a 78 y.o. female who presents today for her Complete Annual Exam. She feels well. She reports exercising - none. She reports she is sleeping well. Breast complaints - none.  Health Maintenance  Topic Date Due   Hepatitis B Vaccine (2 of 3 - 19+ 3-dose series) 08/01/2011   DTaP/Tdap/Td vaccine (2 - Td or Tdap) 03/10/2022   COVID-19 Vaccine (7 - Pfizer risk 2024-25 season) 04/24/2023   Medicare Annual Wellness Visit  07/26/2023   Flu Shot  08/10/2023   Mammogram  08/17/2023   Hemoglobin A1C  11/29/2023   Yearly kidney function blood test for diabetes  01/29/2024   Yearly kidney health urinalysis for diabetes  01/29/2024   Eye exam for diabetics  05/14/2024   Complete foot exam   07/26/2024   DEXA scan (bone density measurement)  08/10/2024   Pneumococcal Vaccine for age over 65  Completed   Hepatitis C Screening  Completed   Zoster (Shingles) Vaccine  Completed   HPV Vaccine  Aged Out   Meningitis B Vaccine  Aged Out   Colon Cancer Screening  Discontinued    Hypertension This is a chronic problem. The problem is controlled. Pertinent negatives include no chest pain, headaches, palpitations or shortness of breath. Past treatments include ACE inhibitors and diuretics. The current treatment provides significant improvement. Hypertensive end-organ damage includes kidney disease, CAD/MI and CVA.  Diabetes She presents for her follow-up diabetic visit. She has type 2 diabetes mellitus. Her disease course has been stable. Pertinent negatives for hypoglycemia include no dizziness, headaches or nervousness/anxiousness. Pertinent negatives for diabetes include no chest pain, no fatigue and no weakness. Diabetic complications include a CVA. Current diabetic treatments: MTF and Mounjaro . An ACE inhibitor/angiotensin II receptor blocker is  being taken.  Hyperlipidemia This is a chronic problem. The problem is controlled. Associated symptoms include myalgias. Pertinent negatives include no chest pain or shortness of breath. Current antihyperlipidemic treatment includes statins. The current treatment provides significant improvement of lipids.    Review of Systems  Constitutional:  Negative for fatigue and unexpected weight change.  HENT:  Negative for trouble swallowing.   Eyes:  Negative for visual disturbance.  Respiratory:  Negative for cough, chest tightness, shortness of breath and wheezing.   Cardiovascular:  Negative for chest pain, palpitations and leg swelling.  Gastrointestinal:  Negative for abdominal pain, constipation and diarrhea.  Genitourinary:  Negative for dysuria, frequency and urgency.  Musculoskeletal:  Positive for arthralgias, gait problem and myalgias.  Neurological:  Negative for dizziness, weakness, light-headedness and headaches.  Psychiatric/Behavioral:  Negative for dysphoric mood and sleep disturbance. The patient is not nervous/anxious.      Lab Results  Component Value Date   NA 140 01/29/2023   K 4.1 01/29/2023   CO2 24 01/29/2023   GLUCOSE 139 (H) 01/29/2023   BUN 16 01/29/2023   CREATININE 0.78 01/29/2023   CALCIUM 10.6 (H) 01/29/2023   EGFR 79 01/29/2023   GFRNONAA 80 11/06/2019   Lab Results  Component Value Date   CHOL 178 07/25/2022   HDL 43 07/25/2022   LDLCALC 88 07/25/2022   TRIG 282 (H) 07/25/2022   CHOLHDL 4.1 07/25/2022   Lab Results  Component Value Date   TSH 1.410 07/25/2022   Lab Results  Component Value Date  HGBA1C 7.0 (A) 05/29/2023   Lab Results  Component Value Date   WBC 6.1 07/25/2022   HGB 14.2 07/25/2022   HCT 43.1 07/25/2022   MCV 90 07/25/2022   PLT 264 07/25/2022   Lab Results  Component Value Date   ALT 23 01/29/2023   AST 20 01/29/2023   ALKPHOS 63 01/29/2023   BILITOT 0.5 01/29/2023   Lab Results  Component Value Date   VD25OH  19.3 (L) 07/25/2022     Patient Active Problem List   Diagnosis Date Noted   Restless leg syndrome 10/24/2022   Primary insomnia 10/24/2022   Vitamin D  deficiency 07/25/2022   Heart palpitations 02/15/2022   Aortic atherosclerosis (HCC) 07/15/2019   Facial basal cell cancer 10/31/2016   Migraine 10/31/2016   Lipoma of left upper extremity 10/11/2015   Type II diabetes mellitus with complication (HCC) 02/11/2015   Ovarian failure 11/10/2014   Essential (primary) hypertension 07/15/2014   Hyperlipidemia associated with type 2 diabetes mellitus (HCC) 07/15/2014   Tricompartment osteoarthritis of knee 07/15/2014    Allergies  Allergen Reactions   Glipizide Swelling    hives   Latex    Codeine Hives    Past Surgical History:  Procedure Laterality Date   ABDOMINAL HYSTERECTOMY     BASAL CELL CARCINOMA EXCISION     CARPAL TUNNEL RELEASE Bilateral    CARPAL TUNNEL RELEASE     CATARACT EXTRACTION W/PHACO Left 09/07/2016   Procedure: CATARACT EXTRACTION PHACO AND INTRAOCULAR LENS PLACEMENT (IOC);  Surgeon: Myrna Adine Anes, MD;  Location: [facility] ORS;  Service: Ophthalmology;  Laterality: Left;  US  00:41.9 AP% 14.9 CDE 6.23 Fluid pack lot # [id number]   CATARACT EXTRACTION W/PHACO Right 10/19/2016   Procedure: CATARACT EXTRACTION PHACO AND INTRAOCULAR LENS PLACEMENT (IOC);  Surgeon: Myrna Adine Anes, MD;  Location: [facility] ORS;  Service: Ophthalmology;  Laterality: Right;  casette lot # [id number] H US   00:59.5 AP%   16.5 CDE   9.79    COLONOSCOPY  06/2012   normal (in [place])   EYE SURGERY     TONSILLECTOMY     TUBAL LIGATION      Social History   Tobacco Use   Smoking status: Never   Smokeless tobacco: Never   Tobacco comments:    smoking cessation materials not required  Vaping Use   Vaping status: Never Used  Substance Use Topics   Alcohol use: Not Currently    Alcohol/week: 1.0 standard drink of alcohol    Types: 1 Glasses of wine per week    Comment: once monthly  maybe   Drug use: Never     Medication list has been reviewed and updated.  Current Meds  Medication Sig   Acetaminophen (TYLENOL ARTHRITIS PAIN PO) Take 1 capsule by mouth 2 (two) times daily.   aspirin 81 MG chewable tablet Chew by mouth daily.   cetirizine (ZYRTEC) 10 MG tablet Take 10 mg by mouth as needed.    Cholecalciferol (D3-1000) 25 MCG (1000 UT) capsule    Cyanocobalamin  (B-12) 1000 MCG CAPS    glucose blood test strip    lisinopril -hydrochlorothiazide  (ZESTORETIC ) 20-12.5 MG tablet Take 1 tablet by mouth daily.   metFORMIN  (GLUCOPHAGE ) 1000 MG tablet Take 1 tablet (1,000 mg total) by mouth 2 (two) times daily.   naproxen sodium (ANAPROX) 220 MG tablet Take 220 mg by mouth 2 (two) times daily with a meal. PRN   Pyridoxine HCl (VITAMIN B6) 200 MG TABS Take 100 mg by mouth 2 (two) times  daily.   rOPINIRole  (REQUIP ) 0.25 MG tablet TAKE 1 TABLET (0.25 MG TOTAL) BY MOUTH AT BEDTIME AS NEEDED. FOR RESTLESS LEG SYMPTOMS   simvastatin  (ZOCOR ) 20 MG tablet Take 1 tablet (20 mg total) by mouth daily.   tirzepatide  (MOUNJARO ) 7.5 MG/0.5ML Pen Inject 7.5 mg into the skin once a week.       07/27/2023   10:04 AM 05/29/2023   11:46 AM 04/13/2023   10:57 AM 01/29/2023    1:27 PM  GAD 7 : Generalized Anxiety Score  Nervous, Anxious, on Edge 0 0 0 0  Control/stop worrying 0 0 0 0  Worry too much - different things 0 0 0 0  Trouble relaxing 0 0 0 0  Restless 0 0 0 0  Easily annoyed or irritable 0 0 0 0  Afraid - awful might happen 0 0 0 0  Total GAD 7 Score 0 0 0 0  Anxiety Difficulty Not difficult at all Not difficult at all Not difficult at all Not difficult at all       07/27/2023   10:04 AM 05/29/2023   11:45 AM 04/13/2023   10:57 AM  Depression screen PHQ 2/9  Decreased Interest 0 0 0  Down, Depressed, Hopeless 0 0 0  PHQ - 2 Score 0 0 0  Altered sleeping 0 1 0  Tired, decreased energy 0 0 0  Change in appetite 0 0 0  Feeling bad or failure about yourself  0 0 0  Trouble  concentrating 0 0 0  Moving slowly or fidgety/restless 0 0 0  Suicidal thoughts 0 0 0  PHQ-9 Score 0 1 0  Difficult doing work/chores Not difficult at all Not difficult at all Not difficult at all    BP Readings from Last 3 Encounters:  07/27/23 124/70  05/29/23 126/74  04/16/23 128/72    Physical Exam Vitals and nursing note reviewed.  Constitutional:      General: She is not in acute distress.    Appearance: She is well-developed.  HENT:     Head: Normocephalic and atraumatic.     Right Ear: Tympanic membrane and ear canal normal.     Left Ear: Tympanic membrane and ear canal normal.     Nose:     Right Sinus: No maxillary sinus tenderness.     Left Sinus: No maxillary sinus tenderness.  Eyes:     General: No scleral icterus.       Right eye: No discharge.        Left eye: No discharge.     Conjunctiva/sclera: Conjunctivae normal.  Neck:     Thyroid : No thyromegaly.     Vascular: No carotid bruit.  Cardiovascular:     Rate and Rhythm: Normal rate and regular rhythm.     Pulses: Normal pulses.     Heart sounds: Normal heart sounds.  Pulmonary:     Effort: Pulmonary effort is normal. No respiratory distress.     Breath sounds: No wheezing.  Abdominal:     General: Bowel sounds are normal.     Palpations: Abdomen is soft.     Tenderness: There is no abdominal tenderness.  Musculoskeletal:     Cervical back: Normal range of motion. No erythema.     Right lower leg: No edema.     Left lower leg: No edema.  Lymphadenopathy:     Cervical: No cervical adenopathy.  Skin:    General: Skin is warm and dry.     Findings:  No rash.  Neurological:     Mental Status: She is alert and oriented to person, place, and time.     Cranial Nerves: No cranial nerve deficit.     Sensory: No sensory deficit.     Deep Tendon Reflexes: Reflexes are normal and symmetric.  Psychiatric:        Attention and Perception: Attention normal.        Mood and Affect: Mood normal.     Diabetic Foot Exam - Simple   Simple Foot Form Diabetic Foot exam was performed with the following findings: Yes 07/27/2023 10:28 AM  Visual Inspection No deformities, no ulcerations, no other skin breakdown bilaterally: Yes Sensation Testing See comments: Yes Pulse Check Posterior Tibialis and Dorsalis pulse intact bilaterally: Yes Comments Decreased sensation distal feet bilaterally      Wt Readings from Last 3 Encounters:  07/27/23 194 lb (88 kg)  05/29/23 205 lb 6 oz (93.2 kg)  04/16/23 209 lb 4 oz (94.9 kg)    BP 124/70   Pulse 87   Ht 5' 3 (1.6 m)   Wt 194 lb (88 kg)   SpO2 99%   BMI 34.37 kg/m   Assessment and Plan:  Problem List Items Addressed This Visit       Unprioritized   Essential (primary) hypertension - Primary (Chronic)   Blood pressure is well controlled.  Current medications lisinopril  and hydrochlorothiazide . Will continue same regimen along with efforts to limit dietary sodium.       Relevant Orders   CBC with Differential/Platelet   Comprehensive metabolic panel with GFR   TSH   Hyperlipidemia associated with type 2 diabetes mellitus (HCC) (Chronic)   LDL is  Lab Results  Component Value Date   LDLCALC 88 07/25/2022   Current regimen is simvastatin .  No medication side effects noted. Goal LDL is <70.       Relevant Orders   Lipid panel   Type II diabetes mellitus with complication (HCC) (Chronic)   Blood sugars have been stable.  No recent hypoglycemic events requiring assistance. Currently medications are Mounjaro  and MTF. Lab Results  Component Value Date   HGBA1C 7.0 (A) 05/29/2023   Last visit mounjaro  was increased 7.5 mg without side effects so far. Weight continues to decrease steadily.       Relevant Orders   Comprehensive metabolic panel with GFR   Microalbumin / creatinine urine ratio   Vitamin D  deficiency   On daily supplement - recommend indefinite dosing      Relevant Orders   VITAMIN D  25 Hydroxy  (Vit-D Deficiency, Fractures)   Other Visit Diagnoses       Encounter for screening mammogram for breast cancer       scheduled     Long-term current use of injectable noninsulin antidiabetic medication           Return in about 4 months (around 11/27/2023) for diabetes.    Leita HILARIO Adie, MD [facility] [place]

## 2023-07-27 NOTE — Assessment & Plan Note (Addendum)
 Blood sugars have been stable.  No recent hypoglycemic events requiring assistance. Currently medications are Mounjaro  and MTF. Lab Results  Component Value Date   HGBA1C 7.0 (A) 05/29/2023   Last visit mounjaro  was increased 7.5 mg without side effects so far. Weight continues to decrease steadily.

## 2023-07-27 NOTE — Assessment & Plan Note (Signed)
 Blood pressure is well controlled.  Current medications lisinopril and hydrochlorothiazide. Will continue same regimen along with efforts to limit dietary sodium.

## 2023-07-27 NOTE — Assessment & Plan Note (Signed)
 On daily supplement - recommend indefinite dosing

## 2023-07-28 ENCOUNTER — Ambulatory Visit: Payer: Self-pay | Admitting: Internal Medicine

## 2023-07-31 LAB — CBC WITH DIFFERENTIAL/PLATELET
Basophils Absolute: 0 x10E3/uL (ref 0.0–0.2)
Basos: 0 %
EOS (ABSOLUTE): 0.1 x10E3/uL (ref 0.0–0.4)
Eos: 1 %
Hematocrit: 42.9 % (ref 34.0–46.6)
Hemoglobin: 14 g/dL (ref 11.1–15.9)
Immature Grans (Abs): 0 x10E3/uL (ref 0.0–0.1)
Immature Granulocytes: 0 %
Lymphocytes Absolute: 1.9 x10E3/uL (ref 0.7–3.1)
Lymphs: 39 %
MCH: 29.1 pg (ref 26.6–33.0)
MCHC: 32.6 g/dL (ref 31.5–35.7)
MCV: 89 fL (ref 79–97)
Monocytes Absolute: 0.5 x10E3/uL (ref 0.1–0.9)
Monocytes: 11 %
Neutrophils Absolute: 2.4 x10E3/uL (ref 1.4–7.0)
Neutrophils: 49 %
Platelets: 228 x10E3/uL (ref 150–450)
RBC: 4.81 x10E6/uL (ref 3.77–5.28)
RDW: 13.5 % (ref 11.7–15.4)
WBC: 4.9 x10E3/uL (ref 3.4–10.8)

## 2023-07-31 LAB — LIPID PANEL
Chol/HDL Ratio: 3 ratio (ref 0.0–4.4)
Cholesterol, Total: 131 mg/dL (ref 100–199)
HDL: 43 mg/dL (ref >39)
LDL Chol Calc (NIH): 59 mg/dL (ref 0–99)
Triglycerides: 175 mg/dL — ABNORMAL HIGH (ref 0–149)
VLDL Cholesterol Cal: 29 mg/dL (ref 5–40)

## 2023-07-31 LAB — COMPREHENSIVE METABOLIC PANEL WITH GFR
ALT: 21 IU/L (ref 0–32)
AST: 22 IU/L (ref 0–40)
Albumin: 4.6 g/dL (ref 3.8–4.8)
Alkaline Phosphatase: 47 IU/L (ref 44–121)
BUN/Creatinine Ratio: 27 (ref 12–28)
BUN: 24 mg/dL (ref 8–27)
Bilirubin Total: 0.6 mg/dL (ref 0.0–1.2)
CO2: 23 mmol/L (ref 20–29)
Calcium: 10.2 mg/dL (ref 8.7–10.3)
Chloride: 99 mmol/L (ref 96–106)
Creatinine, Ser: 0.88 mg/dL (ref 0.57–1.00)
Globulin, Total: 2.1 g/dL (ref 1.5–4.5)
Glucose: 87 mg/dL (ref 70–99)
Potassium: 4.1 mmol/L (ref 3.5–5.2)
Sodium: 140 mmol/L (ref 134–144)
Total Protein: 6.7 g/dL (ref 6.0–8.5)
eGFR: 68 mL/min/1.73 (ref >59)

## 2023-07-31 LAB — MICROALBUMIN / CREATININE URINE RATIO
Creatinine, Urine: 173.4 mg/dL
Microalb/Creat Ratio: 29 mg/g{creat} (ref 0–29)
Microalbumin, Urine: 50.9 ug/mL

## 2023-07-31 LAB — VITAMIN D 25 HYDROXY (VIT D DEFICIENCY, FRACTURES): Vit D, 25-Hydroxy: 35.5 ng/mL (ref 30.0–100.0)

## 2023-07-31 LAB — TSH: TSH: 1 u[IU]/mL (ref 0.450–4.500)

## 2023-08-01 ENCOUNTER — Ambulatory Visit: Payer: Medicare Other | Admitting: Emergency Medicine

## 2023-08-01 VITALS — Ht 63.0 in | Wt 194.0 lb

## 2023-08-01 DIAGNOSIS — Z0000 Encounter for general adult medical examination without abnormal findings: Secondary | ICD-10-CM

## 2023-08-01 NOTE — Patient Instructions (Signed)
 Nancy Chen , Thank you for taking time out of your busy schedule to complete your Annual Wellness Visit with me. I enjoyed our conversation and look forward to speaking with you again next year. I, as well as your care team,  appreciate your ongoing commitment to your health goals. Please review the following plan we discussed and let me know if I can assist you in the future. Your Game plan/ To Do List    Referrals: None   Follow up Visits: Next Medicare AWV with our clinical staff: 08/06/24 @ 11:30am (VIDEO VISIT)   Have you seen your provider in the last 6 months (3 months if uncontrolled diabetes)? Yes Next Office Visit with your provider: 10/01/23 @ 11:00am with Dr. Justus  Clinician Recommendations: Get the covid and flu vaccines in the fall. Get a tetanus shot at your local pharmacy.  Aim for 30 minutes of exercise or brisk walking, 6-8 glasses of water, and 5 servings of fruits and vegetables each day.       This is a list of the screening recommended for you and due dates:  Health Maintenance  Topic Date Due   Hepatitis B Vaccine (2 of 3 - 19+ 3-dose series) 08/01/2011   DTaP/Tdap/Td vaccine (2 - Td or Tdap) 03/10/2022   COVID-19 Vaccine (7 - Pfizer risk 2024-25 season) 04/24/2023   Flu Shot  08/10/2023   Mammogram  08/17/2023   Hemoglobin A1C  11/29/2023   Eye exam for diabetics  05/14/2024   Yearly kidney function blood test for diabetes  07/26/2024   Yearly kidney health urinalysis for diabetes  07/26/2024   Complete foot exam   07/26/2024   Medicare Annual Wellness Visit  07/31/2024   DEXA scan (bone density measurement)  08/10/2024   Pneumococcal Vaccine for age over 67  Completed   Hepatitis C Screening  Completed   Zoster (Shingles) Vaccine  Completed   HPV Vaccine  Aged Out   Meningitis B Vaccine  Aged Out   Colon Cancer Screening  Discontinued    Advanced directives: (Copy Requested) Please bring a copy of your health care power of attorney and living will to  the office to be added to your chart at your convenience. You can mail to [facility] [address] [place], [place] [address] or email to [email or web address] Advance Care Planning is important because it:  [x]  Makes sure you receive the medical care that is consistent with your values, goals, and preferences  [x]  It provides guidance to your family and loved ones and reduces their decisional burden about whether or not they are making the right decisions based on your wishes.  Follow the link provided in your after visit summary or read over the paperwork we have mailed to you to help you started getting your Advance Directives in place. If you need assistance in completing these, please reach out to us  so that we can help you!  See attachments for Preventive Care and Fall Prevention Tips.   Fall Prevention in the Home, Adult Falls can cause injuries and affect people of all ages. There are many simple things that you can do to make your home safe and to help prevent falls. If you need it, ask for help making these changes. What actions can I take to prevent falls? General information Use good lighting in all rooms. Make sure to: Replace any light bulbs that burn out. Turn on lights if it is dark and use night-lights. Keep items  that you use often in easy-to-reach places. Lower the shelves around your home if needed. Move furniture so that there are clear paths around it. Do not keep throw rugs or other things on the floor that can make you trip. If any of your floors are uneven, fix them. Add color or contrast paint or tape to clearly mark and help you see: Grab bars or handrails. First and last steps of staircases. Where the edge of each step is. If you use a ladder or stepladder: Make sure that it is fully opened. Do not climb a closed ladder. Make sure the sides of the ladder are locked in place. Have someone hold the ladder while you use it. Know where  your pets are as you move through your home. What can I do in the bathroom?     Keep the floor dry. Clean up any water that is on the floor right away. Remove soap buildup in the bathtub or shower. Buildup makes bathtubs and showers slippery. Use non-skid mats or decals on the floor of the bathtub or shower. Attach bath mats securely with double-sided, non-slip rug tape. If you need to sit down while you are in the shower, use a non-slip stool. Install grab bars by the toilet and in the bathtub and shower. Do not use towel bars as grab bars. What can I do in the bedroom? Make sure that you have a light by your bed that is easy to reach. Do not use any sheets or blankets on your bed that hang to the floor. Have a firm bench or chair with side arms that you can use for support when you get dressed. What can I do in the kitchen? Clean up any spills right away. If you need to reach something above you, use a sturdy step stool that has a grab bar. Keep electrical cables out of the way. Do not use floor polish or wax that makes floors slippery. What can I do with my stairs? Do not leave anything on the stairs. Make sure that you have a light switch at the top and the bottom of the stairs. Have them installed if you do not have them. Make sure that there are handrails on both sides of the stairs. Fix handrails that are broken or loose. Make sure that handrails are as long as the staircases. Install non-slip stair treads on all stairs in your home if they do not have carpet. Avoid having throw rugs at the top or bottom of stairs, or secure the rugs with carpet tape to prevent them from moving. Choose a carpet design that does not hide the edge of steps on the stairs. Make sure that carpet is firmly attached to the stairs. Fix any carpet that is loose or worn. What can I do on the outside of my home? Use bright outdoor lighting. Repair the edges of walkways and driveways and fix any cracks. Clear  paths of anything that can make you trip, such as tools or rocks. Add color or contrast paint or tape to clearly mark and help you see high doorway thresholds. Trim any bushes or trees on the main path into your home. Check that handrails are securely fastened and in good repair. Both sides of all steps should have handrails. Install guardrails along the edges of any raised decks or porches. Have leaves, snow, and ice cleared regularly. Use sand, salt, or ice melt on walkways during winter months if you live where there is ice  and snow. In the garage, clean up any spills right away, including grease or oil spills. What other actions can I take? Review your medicines with your health care provider. Some medicines can make you confused or feel dizzy. This can increase your chance of falling. Wear closed-toe shoes that fit well and support your feet. Wear shoes that have rubber soles and low heels. Use a cane, walker, scooter, or crutches that help you move around if needed. Talk with your provider about other ways that you can decrease your risk of falls. This may include seeing a physical therapist to learn to do exercises to improve movement and strength. Where to find more information Centers for Disease Control and Prevention, [employer]: TonerPromos.no [employer] on Aging: BaseRingTones.pl National Institute on Aging: BaseRingTones.pl Contact a health care provider if: You are afraid of falling at home. You feel weak, drowsy, or dizzy at home. You fall at home. Get help right away if you: Lose consciousness or have trouble moving after a fall. Have a fall that causes a head injury. These symptoms may be an emergency. Get help right away. Call 911. Do not wait to see if the symptoms will go away. Do not drive yourself to the hospital. This information is not intended to replace advice given to you by your health care provider. Make sure you discuss any questions you have with your health care  provider. Document Revised: 08/29/2021 Document Reviewed: 08/29/2021 Elsevier Patient Education  2024 [employer].

## 2023-08-01 NOTE — Progress Notes (Signed)
 Subjective:   Nancy Chen is a 78 y.o. who presents for a Medicare Wellness preventive visit.  As a reminder, Annual Wellness Visits don't include a physical exam, and some assessments may be limited, especially if this visit is performed virtually. We may recommend an in-person follow-up visit with your provider if needed.  Visit Complete: Virtual I connected with  Nancy Chen on 08/01/23 by a audio enabled telemedicine application and verified that I am speaking with the correct person using two identifiers.  Patient Location: Home  Provider Location: Home Office  I discussed the limitations of evaluation and management by telemedicine. The patient expressed understanding and agreed to proceed.  Vital Signs: Because this visit was a virtual/telehealth visit, some criteria may be missing or patient reported. Any vitals not documented were not able to be obtained and vitals that have been documented are patient reported.  VideoDeclined- This patient declined Librarian, academic. Therefore the visit was completed with audio only.  Persons Participating in Visit: Patient.  AWV Questionnaire: No: Patient Medicare AWV questionnaire was not completed prior to this visit.  Cardiac Risk Factors include: advanced age (>28men, >9 women);dyslipidemia;hypertension;diabetes mellitus;obesity (BMI >30kg/m2)     Objective:    Today's Vitals   08/01/23 1127  Weight: 194 lb (88 kg)  Height: 5' 3 (1.6 m)   Body mass index is 34.37 kg/m.     08/01/2023   11:42 AM 07/26/2022   11:39 AM 07/11/2021   10:01 AM 07/07/2020   10:17 AM 07/07/2019   10:24 AM 07/03/2018   11:03 AM 07/02/2017   11:26 AM  Advanced Directives  Does Patient Have a Medical Advance Directive? Yes No Yes Yes Yes Yes Yes   Type of Estate agent of [place];Living will  Healthcare Power of [place];Living will Healthcare Power of [place];Living will Healthcare Power of  [place];Living will Healthcare Power of [place];Living will Healthcare Power of [place];Living will  Copy of Healthcare Power of Attorney in Chart? No - copy requested   Yes - validated most recent copy scanned in chart (See row information) Yes - validated most recent copy scanned in chart (See row information) Yes - validated most recent copy scanned in chart (See row information)  Yes   Would patient like information on creating a medical advance directive?  No - Patient declined          Data saved with a previous flowsheet row definition    Current Medications (verified) Outpatient Encounter Medications as of 08/01/2023  Medication Sig   Acetaminophen (TYLENOL ARTHRITIS PAIN PO) Take 1 capsule by mouth 2 (two) times daily. (Patient taking differently: Take 1 capsule by mouth 2 (two) times daily. PRN)   aspirin 81 MG chewable tablet Chew by mouth daily.   cetirizine (ZYRTEC) 10 MG tablet Take 10 mg by mouth as needed.    Cholecalciferol (D3-1000) 25 MCG (1000 UT) capsule    Cyanocobalamin  (B-12) 1000 MCG CAPS    glucose blood test strip    lisinopril -hydrochlorothiazide  (ZESTORETIC ) 20-12.5 MG tablet Take 1 tablet by mouth daily.   metFORMIN  (GLUCOPHAGE ) 1000 MG tablet Take 1 tablet (1,000 mg total) by mouth 2 (two) times daily.   naproxen sodium (ANAPROX) 220 MG tablet Take 220 mg by mouth 2 (two) times daily with a meal. PRN   Pyridoxine HCl (VITAMIN B6) 200 MG TABS Take 100 mg by mouth 2 (two) times daily.   rOPINIRole  (REQUIP ) 0.25 MG tablet TAKE 1 TABLET (0.25 MG TOTAL)  BY MOUTH AT BEDTIME AS NEEDED. FOR RESTLESS LEG SYMPTOMS   simvastatin  (ZOCOR ) 20 MG tablet Take 1 tablet (20 mg total) by mouth daily.   temazepam  (RESTORIL ) 15 MG capsule Take 15 mg by mouth at bedtime as needed for sleep.   tirzepatide  (MOUNJARO ) 7.5 MG/0.5ML Pen Inject 7.5 mg into the skin once a week.   No facility-administered encounter medications on file as of 08/01/2023.    Allergies  (verified) Glipizide, Latex, and Codeine   History: Past Medical History:  Diagnosis Date   Allergy    Anemia    Came from menstrual issues   Anxiety    Arthritis    Bronchitis    Cataract 2018   Clotting disorder (HCC) When I was teenager   Menstral   COPD (chronic obstructive pulmonary disease) (HCC)    Diabetes mellitus without complication (HCC)    Facial basal cell cancer 10/31/2016   also left lower eyelid, chest and shoulder   Hypercholesteremia    Hypertension    Long-term use of high-risk medication 11/10/2014   Actos  started 11/2014    Lower extremity edema    Past Surgical History:  Procedure Laterality Date   ABDOMINAL HYSTERECTOMY     BASAL CELL CARCINOMA EXCISION     CARPAL TUNNEL RELEASE Bilateral    CARPAL TUNNEL RELEASE     CATARACT EXTRACTION W/PHACO Left 09/07/2016   Procedure: CATARACT EXTRACTION PHACO AND INTRAOCULAR LENS PLACEMENT (IOC);  Surgeon: Myrna Adine Anes, MD;  Location: [facility] ORS;  Service: Ophthalmology;  Laterality: Left;  US  00:41.9 AP% 14.9 CDE 6.23 Fluid pack lot # [id number]   CATARACT EXTRACTION W/PHACO Right 10/19/2016   Procedure: CATARACT EXTRACTION PHACO AND INTRAOCULAR LENS PLACEMENT (IOC);  Surgeon: Myrna Adine Anes, MD;  Location: [facility] ORS;  Service: Ophthalmology;  Laterality: Right;  casette lot # [id number]   00:59.5 AP%   16.5 CDE   9.79    COLONOSCOPY  06/2012   normal (in [place])   EYE SURGERY     TONSILLECTOMY     TUBAL LIGATION     Family History  Problem Relation Age of Onset   Alzheimer's disease Mother    Arthritis Mother    Miscarriages / [place] Mother    CAD Father    Kidney disease Father    Stroke Father    Breast cancer Maternal Grandmother        great gm   Obesity Maternal Grandmother    Miscarriages / Stillbirths Maternal Grandmother    Diabetes Maternal Aunt    Kidney disease Maternal Aunt    Obesity Maternal Aunt    Heart attack Child    Heart attack Paternal Uncle    Heart disease  Son    Social History   Socioeconomic History   Marital status: Widowed    Spouse name: Not on file   Number of children: 1   Years of education: Not on file   Highest education level: 12th grade  Occupational History   Occupation: Retired  Tobacco Use   Smoking status: Never    Passive exposure: Past   Smokeless tobacco: Never   Tobacco comments:    smoking cessation materials not required  Vaping Use   Vaping status: Never Used  Substance and Sexual Activity   Alcohol use: Not Currently    Alcohol/week: 1.0 standard drink of alcohol    Types: 1 Glasses of wine per week    Comment: once monthly maybe, last use 2 years ago   Drug  use: Never   Sexual activity: Not Currently    Birth control/protection: None  Other Topics Concern   Not on file  Social History Narrative   Pt lives alone   Social Drivers of Health   Financial Resource Strain: Low Risk  (08/01/2023)   Overall Financial Resource Strain (CARDIA)    Difficulty of Paying Living Expenses: Not hard at all  Food Insecurity: No Food Insecurity (08/01/2023)   Hunger Vital Sign    Worried About Running Out of Food in the Last Year: Never true    Ran Out of Food in the Last Year: Never true  Transportation Needs: No Transportation Needs (08/01/2023)   PRAPARE - Administrator, Civil Service (Medical): No    Lack of Transportation (Non-Medical): No  Physical Activity: Inactive (08/01/2023)   Exercise Vital Sign    Days of Exercise per Week: 0 days    Minutes of Exercise per Session: 0 min  Stress: No Stress Concern Present (08/01/2023)   [employer] of Occupational Health - Occupational Stress Questionnaire    Feeling of Stress: Only a little  Social Connections: Socially Isolated (08/01/2023)   Social Connection and Isolation Panel    Frequency of Communication with Friends and Family: More than three times a week    Frequency of Social Gatherings with Friends and Family: More than three times a  week    Attends Religious Services: Never    Database administrator or Organizations: No    Attends Banker Meetings: Never    Marital Status: Widowed    Tobacco Counseling Counseling given: Not Answered Tobacco comments: smoking cessation materials not required    Clinical Intake:  Pre-visit preparation completed: Yes  Pain : No/denies pain     BMI - recorded: 34.37 Nutritional Status: BMI > 30  Obese Nutritional Risks: None Diabetes: Yes CBG done?: No Did pt. bring in CBG monitor from home?: No  Lab Results  Component Value Date   HGBA1C 7.0 (A) 05/29/2023   HGBA1C 7.8 (H) 01/29/2023   HGBA1C 7.1 09/26/2022     How often do you need to have someone help you when you read instructions, pamphlets, or other written materials from your doctor or pharmacy?: 1 - Never  Interpreter Needed?: No  Information entered by :: Vina Ned, CMA   Activities of Daily Living     08/01/2023   11:31 AM  In your present state of health, do you have any difficulty performing the following activities:  Hearing? 0  Vision? 0  Difficulty concentrating or making decisions? 0  Walking or climbing stairs? 1  Comment rollator  Dressing or bathing? 0  Doing errands, shopping? 0  Preparing Food and eating ? N  Using the Toilet? N  In the past six months, have you accidently leaked urine? Y  Comment wears poise pads  Do you have problems with loss of bowel control? N  Managing your Medications? N  Managing your Finances? N  Housekeeping or managing your Housekeeping? N    Patient Care Team: Justus Leita DEL, MD as PCP - General (Internal Medicine) Darliss Rogue, MD as PCP - Cardiology (Cardiology) Myrna Adine Anes, MD as Consulting Physician (Ophthalmology) Cathlyn Seal, MD as Consulting Physician (Dermatology) Throat, [place] [facility] (Otolaryngology)  I have updated your Care Teams any recent Medical Services you may have received from  other providers in the past year.     Assessment:   This is a routine wellness  examination for Nancy Chen.  Hearing/Vision screen Hearing Screening - Comments:: Denies hearing loss  Vision Screening - Comments:: Get DM eye exams, Dr. Adine Novak, [place]   Goals Addressed             This Visit's Progress    Patient Stated       Continue to get Diabetes under control       Depression Screen     08/01/2023   11:39 AM 07/27/2023   10:04 AM 05/29/2023   11:45 AM 04/13/2023   10:57 AM 01/29/2023    1:27 PM 07/26/2022   11:37 AM 07/25/2022    9:55 AM  PHQ 2/9 Scores  PHQ - 2 Score 0 0 0 0 0 0 0  PHQ- 9 Score 0 0 1 0 2 0 3    Fall Risk     08/01/2023   11:43 AM 07/27/2023   10:04 AM 05/29/2023   10:59 AM 04/13/2023   10:57 AM 01/29/2023    1:27 PM  Fall Risk   Falls in the past year? 0 0 0 0 0  Number falls in past yr: 0 0 0 0 0  Injury with Fall? 0 0 0 0 0  Risk for fall due to : History of fall(s);Impaired balance/gait;Orthopedic patient;Impaired mobility No Fall Risks No Fall Risks No Fall Risks No Fall Risks  Follow up Falls evaluation completed;Education provided Falls evaluation completed Falls evaluation completed Falls evaluation completed Falls evaluation completed    MEDICARE RISK AT HOME:  Medicare Risk at Home Any stairs in or around the home?: Yes If so, are there any without handrails?: No Home free of loose throw rugs in walkways, pet beds, electrical cords, etc?: Yes Adequate lighting in your home to reduce risk of falls?: Yes Life alert?: No Use of a cane, walker or w/c?: Yes (rollator) Grab bars in the bathroom?: Yes Shower chair or bench in shower?: Yes Elevated toilet seat or a handicapped toilet?: Yes  TIMED UP AND GO:  Was the test performed?  No  Cognitive Function: 6CIT completed        08/01/2023   11:44 AM 07/26/2022   11:41 AM 07/11/2021   10:07 AM 07/07/2019   10:29 AM 07/03/2018   11:09 AM  6CIT Screen  What Year? 0 points 0 points 0  points 0 points 0 points  What month? 0 points 0 points 0 points 0 points 0 points  What time? 0 points 0 points 0 points 0 points 0 points  Count back from 20 0 points 0 points 0 points 0 points 0 points  Months in reverse 0 points 0 points 0 points 0 points 0 points  Repeat phrase 0 points 0 points 2 points 0 points 0 points  Total Score 0 points 0 points 2 points 0 points 0 points    Immunizations Immunization History  Administered Date(s) Administered    sv, Bivalent, Protein Subunit Rsvpref,pf (Abrysvo) 11/10/2022   Hepatitis B 07/04/2011   Hepatitis B, ADULT 07/04/2011   Influenza, High Dose Seasonal PF 10/12/2016, 10/10/2017, 11/17/2020   Influenza,inj,Quad PF,6+ Mos 09/29/2013, 09/29/2014, 10/11/2015   Influenza-Unspecified 10/13/2019, 10/07/2021, 10/10/2022   PFIZER Comirnaty(Gray Top)Covid-19 Tri-Sucrose Vaccine 09/07/2020   PFIZER(Purple Top)SARS-COV-2 Vaccination 03/10/2019, 03/30/2019, 01/05/2020   Pfizer Covid-19 Vaccine Bivalent Booster 108yrs & up 01/06/2021   Pfizer(Comirnaty)Fall Seasonal Vaccine 12 years and older 10/24/2022   Pneumococcal Conjugate-13 02/11/2015   Pneumococcal Polysaccharide-23 01/10/2005, 06/26/2016   Tdap 03/09/2012   Zoster Recombinant(Shingrix) 07/09/2017, 09/25/2017  Screening Tests Health Maintenance  Topic Date Due   Hepatitis B Vaccines (2 of 3 - 19+ 3-dose series) 08/01/2011   DTaP/Tdap/Td (2 - Td or Tdap) 03/10/2022   COVID-19 Vaccine (7 - Pfizer risk 2024-25 season) 04/24/2023   INFLUENZA VACCINE  08/10/2023   MAMMOGRAM  08/17/2023   HEMOGLOBIN A1C  11/29/2023   OPHTHALMOLOGY EXAM  05/14/2024   Diabetic kidney evaluation - eGFR measurement  07/26/2024   Diabetic kidney evaluation - Urine ACR  07/26/2024   FOOT EXAM  07/26/2024   Medicare Annual Wellness (AWV)  07/31/2024   DEXA SCAN  08/10/2024   Pneumococcal Vaccine: 50+ Years  Completed   Hepatitis C Screening  Completed   Zoster Vaccines- Shingrix  Completed   HPV  VACCINES  Aged Out   Meningococcal B Vaccine  Aged Out   Colonoscopy  Discontinued    Health Maintenance  Health Maintenance Due  Topic Date Due   Hepatitis B Vaccines (2 of 3 - 19+ 3-dose series) 08/01/2011   DTaP/Tdap/Td (2 - Td or Tdap) 03/10/2022   COVID-19 Vaccine (7 - Pfizer risk 2024-25 season) 04/24/2023   Health Maintenance Items Addressed: See Nurse Notes at the end of this note  Additional Screening:  Vision Screening: Recommended annual ophthalmology exams for early detection of glaucoma and other disorders of the eye. Would you like a referral to an eye doctor? No    Dental Screening: Recommended annual dental exams for proper oral hygiene  Community Resource Referral / Chronic Care Management: CRR required this visit?  No   CCM required this visit?  No   Plan:    I have personally reviewed and noted the following in the patient's chart:   Medical and social history Use of alcohol, tobacco or illicit drugs  Current medications and supplements including opioid prescriptions. Patient is not currently taking opioid prescriptions. Functional ability and status Nutritional status Physical activity Advanced directives List of other physicians Hospitalizations, surgeries, and ER visits in previous 12 months Vitals Screenings to include cognitive, depression, and falls Referrals and appointments  In addition, I have reviewed and discussed with patient certain preventive protocols, quality metrics, and best practice recommendations. A written personalized care plan for preventive services as well as general preventive health recommendations were provided to patient.   Vina Ned, CMA   08/01/2023   After Visit Summary: (MyChart) Due to this being a telephonic visit, the after visit summary with patients personalized plan was offered to patient via MyChart   Notes:  MMG scheduled 08/23/23 Covid and flu vaccines in the fall Needs Tdap vaccine  (pharmacy) Screening colonoscopy no longer recommended due to age Declined DM & Nutrition education referral

## 2023-08-13 ENCOUNTER — Telehealth: Payer: Self-pay

## 2023-08-13 MED ORDER — TIRZEPATIDE 10 MG/0.5ML ~~LOC~~ SOAJ: 10.0000 mg | SUBCUTANEOUS | 0 refills | Status: DC

## 2023-08-13 NOTE — Telephone Encounter (Signed)
 Copied from CRM #[id number]. Topic: Clinical - Medication Question >> Aug 13, 2023 11:06 AM Treva T wrote: Reason for CRM: Patient calling, states she is in need of a refill on medication of Mounjaro , but a new prescription is needed for the next increased dose. The last dose patient was taking is 7.5 mg.  Patient states pharmacy will be contacting office for new prescribing increased dosage information  Preferred pharmacy:   [facility] [address] [place] - [address] [place] [address] Phone: [phone] Fax: [phone]  Patient can be reached if need to discuss further at [phone].

## 2023-08-23 ENCOUNTER — Ambulatory Visit
Admission: RE | Admit: 2023-08-23 | Discharge: 2023-08-23 | Disposition: A | Discharge status: Home / Self Care | Source: Ambulatory Visit | Attending: Internal Medicine | Admitting: Internal Medicine

## 2023-08-23 DIAGNOSIS — Z1231 Encounter for screening mammogram for malignant neoplasm of breast: Secondary | ICD-10-CM | POA: Insufficient documentation

## 2023-09-10 ENCOUNTER — Other Ambulatory Visit: Payer: Self-pay | Admitting: Internal Medicine

## 2023-09-10 DIAGNOSIS — I10 Essential (primary) hypertension: Secondary | ICD-10-CM

## 2023-09-11 NOTE — Telephone Encounter (Signed)
 Requested medication (s) are due for refill today: yes  Requested medication (s) are on the active medication list: yes  Last refill:  08/13/23 2 ml  Future visit scheduled: no  Notes to clinic:  medication not assigned to a protocol   Requested Prescriptions  Pending Prescriptions Disp Refills   tirzepatide  (MOUNJARO ) 10 MG/0.5ML Pen [Pharmacy Med Name: Mounjaro  10 MG/0.5ML Subcutaneous Solution Pen-injector] 4 mL     Sig: INJECT 10MG  SUBCUTANEOUSLY  ONCE A WEEK     Off-Protocol Failed - 09/11/2023  3:10 PM      Failed - Medication not assigned to a protocol, review manually.      Passed - Valid encounter within last 12 months    Recent Outpatient Visits           1 month ago Essential (primary) hypertension   [place] [facility], Leita DEL, MD   3 months ago Essential (primary) hypertension   [place] [facility] at [facility], Leita DEL, MD   4 months ago Type II diabetes mellitus with complication [facility])   [place] [facility], Leita DEL, MD   5 months ago Vulvovaginitis   [place] [facility] Cathryne JAYSON, MD       Future Appointments             In 1 month Agbor-Etang, Redell, MD [facility] at [facility]            Signed Prescriptions Disp Refills   lisinopril -hydrochlorothiazide  (ZESTORETIC ) 20-12.5 MG tablet 90 tablet 0    Sig: Take 1 tablet by mouth once daily     Cardiovascular:  ACEI + Diuretic Combos Passed - 09/11/2023  3:10 PM      Passed - Na in normal range and within 180 days    Sodium  Date Value Ref Range Status  07/27/2023 140 134 - 144 mmol/L Final         Passed - K in normal range and within 180 days    Potassium  Date Value Ref Range Status  07/27/2023 4.1 3.5 - 5.2 mmol/L Final         Passed - Cr in normal range and within 180 days    Creatinine,  Ser  Date Value Ref Range Status  07/27/2023 0.88 0.57 - 1.00 mg/dL Final   Creatinine, Urine  Date Value Ref Range Status  01/18/2022 239  Final         Passed - eGFR is 30 or above and within 180 days    GFR calc Af Amer  Date Value Ref Range Status  11/06/2019 92 >59 mL/min/1.73 Final    Comment:    **In accordance with recommendations from the NKF-ASN Task force,**   [facility] is in the process of updating its eGFR calculation to the   2021 CKD-EPI creatinine equation that estimates kidney function   without a race variable.    GFR calc non Af Amer  Date Value Ref Range Status  11/06/2019 80 >59 mL/min/1.73 Final   eGFR  Date Value Ref Range Status  07/27/2023 68 >59 mL/min/1.73 Final         Passed - Patient is not pregnant      Passed - Last BP in normal range    BP Readings from Last 1 Encounters:  07/27/23 124/70  Passed - Valid encounter within last 6 months    Recent Outpatient Visits           1 month ago Essential (primary) hypertension   [place] [facility], Leita DEL, MD   3 months ago Essential (primary) hypertension   [place] [facility] at [facility], Leita DEL, MD   4 months ago Type II diabetes mellitus with complication [facility])   [place] [facility] at [facility], Leita DEL, MD   5 months ago Vulvovaginitis   [place] [facility] JAYSON, MD       Future Appointments             In 1 month Darliss Rogue, MD [facility] at [facility]

## 2023-09-11 NOTE — Telephone Encounter (Signed)
 Requested Prescriptions  Pending Prescriptions Disp Refills   lisinopril -hydrochlorothiazide  (ZESTORETIC ) 20-12.5 MG tablet [Pharmacy Med Name: Lisinopril -hydroCHLOROthiazide  20-12.5 MG Oral Tablet] 90 tablet 0    Sig: Take 1 tablet by mouth once daily     Cardiovascular:  ACEI + Diuretic Combos Passed - 09/11/2023  3:10 PM      Passed - Na in normal range and within 180 days    Sodium  Date Value Ref Range Status  07/27/2023 140 134 - 144 mmol/L Final         Passed - K in normal range and within 180 days    Potassium  Date Value Ref Range Status  07/27/2023 4.1 3.5 - 5.2 mmol/L Final         Passed - Cr in normal range and within 180 days    Creatinine, Ser  Date Value Ref Range Status  07/27/2023 0.88 0.57 - 1.00 mg/dL Final   Creatinine, Urine  Date Value Ref Range Status  01/18/2022 239  Final         Passed - eGFR is 30 or above and within 180 days    GFR calc Af Amer  Date Value Ref Range Status  11/06/2019 92 >59 mL/min/1.73 Final    Comment:    **In accordance with recommendations from the NKF-ASN Task force,**   [facility] is in the process of updating its eGFR calculation to the   2021 CKD-EPI creatinine equation that estimates kidney function   without a race variable.    GFR calc non Af Amer  Date Value Ref Range Status  11/06/2019 80 >59 mL/min/1.73 Final   eGFR  Date Value Ref Range Status  07/27/2023 68 >59 mL/min/1.73 Final         Passed - Patient is not pregnant      Passed - Last BP in normal range    BP Readings from Last 1 Encounters:  07/27/23 124/70         Passed - Valid encounter within last 6 months    Recent Outpatient Visits           1 month ago Essential (primary) hypertension   [place] [facility], Leita DEL, MD   3 months ago Essential (primary) hypertension   [place] [facility] at [facility], Leita DEL, MD   4 months ago Type II diabetes  mellitus with complication [facility])   [place] [facility] at [facility], Leita DEL, MD   5 months ago Vulvovaginitis   [place] [facility] JAYSON, MD       Future Appointments             In 1 month Darliss Rogue, MD [facility] at [facility]             tirzepatide  (MOUNJARO ) 10 MG/0.5ML Pen [Pharmacy Med Name: Mounjaro  10 MG/0.5ML Subcutaneous Solution Pen-injector] 4 mL     Sig: INJECT 10MG  SUBCUTANEOUSLY  ONCE A WEEK     Off-Protocol Failed - 09/11/2023  3:10 PM      Failed - Medication not assigned to a protocol, review manually.      Passed - Valid encounter within last 12 months    Recent Outpatient Visits           1 month ago Essential (primary) hypertension   [place] [facility], Leita DEL, MD   3 months ago Essential (primary) hypertension   [place] [facility] at [facility], Leita DEL, MD   4 months ago Type II diabetes mellitus with complication [facility])   [place] [facility] at [facility], Leita DEL, MD   5 months ago Vulvovaginitis   [place] [facility] JAYSON, MD       Future Appointments             In 1 month Agbor-Etang, Redell, MD [facility] at [facility]

## 2023-09-13 ENCOUNTER — Telehealth: Payer: Self-pay

## 2023-09-13 NOTE — Telephone Encounter (Signed)
 Copied from CRM [id number]. Topic: Clinical - Medication Question >> Sep 13, 2023 11:40 AM Gustabo D wrote: tirzepatide  (MOUNJARO ) 10 MG/0.5ML Pen- Wants to know if the dose was suppose to be changed please call her back

## 2023-09-13 NOTE — Telephone Encounter (Signed)
 Called and spoke with patient and informed. She agrees to stay on same dose until next visit with A1C check.

## 2023-10-01 ENCOUNTER — Ambulatory Visit: Admitting: Internal Medicine

## 2023-10-01 ENCOUNTER — Encounter: Payer: Self-pay | Admitting: Internal Medicine

## 2023-10-01 ENCOUNTER — Ambulatory Visit (INDEPENDENT_AMBULATORY_CARE_PROVIDER_SITE_OTHER): Admitting: Internal Medicine

## 2023-10-01 VITALS — BP 118/70 | HR 85 | Ht 63.0 in | Wt 188.0 lb

## 2023-10-01 DIAGNOSIS — E118 Type 2 diabetes mellitus with unspecified complications: Secondary | ICD-10-CM

## 2023-10-01 DIAGNOSIS — Z7985 Long-term (current) use of injectable non-insulin antidiabetic drugs: Secondary | ICD-10-CM

## 2023-10-01 DIAGNOSIS — E1169 Type 2 diabetes mellitus with other specified complication: Secondary | ICD-10-CM | POA: Diagnosis not present

## 2023-10-01 DIAGNOSIS — E785 Hyperlipidemia, unspecified: Secondary | ICD-10-CM | POA: Diagnosis not present

## 2023-10-01 DIAGNOSIS — G43009 Migraine without aura, not intractable, without status migrainosus: Secondary | ICD-10-CM

## 2023-10-01 DIAGNOSIS — I10 Essential (primary) hypertension: Secondary | ICD-10-CM

## 2023-10-01 DIAGNOSIS — Z23 Encounter for immunization: Secondary | ICD-10-CM

## 2023-10-01 LAB — HEMOGLOBIN A1C: Hemoglobin A1C: 5.1

## 2023-10-01 MED ORDER — METFORMIN HCL 1000 MG PO TABS: 1000.0000 mg | ORAL_TABLET | Freq: Two times a day (BID) | ORAL | 1 refills | Status: DC

## 2023-10-01 MED ORDER — ACCU-CHEK GUIDE TEST VI STRP: ORAL_STRIP | 5 refills | Status: DC

## 2023-10-01 MED ORDER — SIMVASTATIN 20 MG PO TABS: 20.0000 mg | ORAL_TABLET | Freq: Every day | ORAL | 1 refills | Status: AC

## 2023-10-01 MED ORDER — MOUNJARO 10 MG/0.5ML ~~LOC~~ SOAJ: 10.0000 mg | SUBCUTANEOUS | 0 refills | Status: DC

## 2023-10-01 NOTE — Progress Notes (Signed)
 Date:  10/01/2023   Name:  Nancy Chen   DOB:  1945-07-23   MRN:  [id number]   Chief Complaint: Diabetes (Patient had recent infection of Covid on 08/11/2023. Tested Positive on 08/5.) and Hypertension  Diabetes She presents for her follow-up diabetic visit. She has type 2 diabetes mellitus. Hypoglycemia symptoms include dizziness and headaches. Pertinent negatives for diabetes include no chest pain, no fatigue and no weakness. Current diabetic treatments: MTF and Mounjaro . She is compliant with treatment all of the time. Her weight is stable.  Hypertension This is a chronic problem. The problem is controlled. Associated symptoms include headaches. Pertinent negatives include no chest pain, palpitations or shortness of breath. Past treatments include ACE inhibitors and diuretics.  Migraine  This is a recurrent (4 migraines in the past month) problem. The problem occurs intermittently. The pain is located in the Left unilateral region. The pain does not radiate. The pain quality is similar to prior headaches. The quality of the pain is described as stabbing. The pain is moderate. Associated symptoms include dizziness and photophobia. Pertinent negatives include no abdominal pain, coughing, nausea, vomiting or weakness. She has tried acetaminophen for the symptoms. Her past medical history is significant for hypertension.  Dizziness This is a new problem. The current episode started more than 1 month ago. The problem occurs intermittently. Associated symptoms include headaches. Pertinent negatives include no abdominal pain, arthralgias, chest pain, coughing, fatigue, myalgias, nausea, vomiting or weakness.    Review of Systems  Constitutional:  Negative for fatigue and unexpected weight change.  HENT:  Negative for trouble swallowing.   Eyes:  Positive for photophobia. Negative for visual disturbance.  Respiratory:  Negative for cough, chest tightness, shortness of breath and wheezing.    Cardiovascular:  Negative for chest pain, palpitations and leg swelling.  Gastrointestinal:  Negative for abdominal pain, constipation, diarrhea, nausea and vomiting.  Musculoskeletal:  Negative for arthralgias and myalgias.  Neurological:  Positive for dizziness, light-headedness and headaches. Negative for syncope and weakness.     Lab Results  Component Value Date   NA 140 07/27/2023   K 4.1 07/27/2023   CO2 23 07/27/2023   GLUCOSE 87 07/27/2023   BUN 24 07/27/2023   CREATININE 0.88 07/27/2023   CALCIUM 10.2 07/27/2023   EGFR 68 07/27/2023   GFRNONAA 80 11/06/2019   Lab Results  Component Value Date   CHOL 131 07/27/2023   HDL 43 07/27/2023   LDLCALC 59 07/27/2023   TRIG 175 (H) 07/27/2023   CHOLHDL 3.0 07/27/2023   Lab Results  Component Value Date   TSH 1.000 07/27/2023   Lab Results  Component Value Date   HGBA1C 7.0 (A) 05/29/2023   Lab Results  Component Value Date   WBC 4.9 07/27/2023   HGB 14.0 07/27/2023   HCT 42.9 07/27/2023   MCV 89 07/27/2023   PLT 228 07/27/2023   Lab Results  Component Value Date   ALT 21 07/27/2023   AST 22 07/27/2023   ALKPHOS 47 07/27/2023   BILITOT 0.6 07/27/2023   Lab Results  Component Value Date   VD25OH 35.5 07/27/2023     Patient Active Problem List   Diagnosis Date Noted   Restless leg syndrome 10/24/2022   Primary insomnia 10/24/2022   Vitamin D  deficiency 07/25/2022   Heart palpitations 02/15/2022   Aortic atherosclerosis (HCC) 07/15/2019   Facial basal cell cancer 10/31/2016   Migraine without aura and without status migrainosus, not intractable 10/31/2016   Lipoma of  left upper extremity 10/11/2015   Type II diabetes mellitus with complication (HCC) 02/11/2015   Ovarian failure 11/10/2014   Essential (primary) hypertension 07/15/2014   Hyperlipidemia associated with type 2 diabetes mellitus (HCC) 07/15/2014   Tricompartment osteoarthritis of knee 07/15/2014    Allergies  Allergen Reactions    Glipizide Swelling    hives   Latex    Codeine Hives    Past Surgical History:  Procedure Laterality Date   ABDOMINAL HYSTERECTOMY     BASAL CELL CARCINOMA EXCISION     CARPAL TUNNEL RELEASE Bilateral    CARPAL TUNNEL RELEASE     CATARACT EXTRACTION W/PHACO Left 09/07/2016   Procedure: CATARACT EXTRACTION PHACO AND INTRAOCULAR LENS PLACEMENT (IOC);  Surgeon: Myrna Adine Anes, MD;  Location: [facility];  Service: Ophthalmology;  Laterality: Left;  US  00:41.9 AP% 14.9 CDE 6.23 Fluid pack lot # [id number]   CATARACT EXTRACTION W/PHACO Right 10/19/2016   Procedure: CATARACT EXTRACTION PHACO AND INTRAOCULAR LENS PLACEMENT (IOC);  Surgeon: Myrna Adine Anes, MD;  Location: [facility];  Service: Ophthalmology;  Laterality: Right;  casette lot # [id number] H US   00:59.5 AP%   16.5 CDE   9.79    COLONOSCOPY  06/2012   normal (in [place])   EYE SURGERY     TONSILLECTOMY     TUBAL LIGATION      Social History   Tobacco Use   Smoking status: Never    Passive exposure: Past   Smokeless tobacco: Never   Tobacco comments:    smoking cessation materials not required  Vaping Use   Vaping status: Never Used  Substance Use Topics   Alcohol use: Not Currently    Alcohol/week: 1.0 standard drink of alcohol    Types: 1 Glasses of wine per week    Comment: once monthly maybe, last use 2 years ago   Drug use: Never     Medication list has been reviewed and updated.  Current Meds  Medication Sig   Acetaminophen (TYLENOL ARTHRITIS PAIN PO) Take 1 capsule by mouth 2 (two) times daily. (Patient taking differently: Take 1 capsule by mouth 2 (two) times daily. PRN)   aspirin 81 MG chewable tablet Chew by mouth daily.   cetirizine (ZYRTEC) 10 MG tablet Take 10 mg by mouth as needed.    Cholecalciferol (D3-1000) 25 MCG (1000 UT) capsule    Cyanocobalamin  (B-12) 1000 MCG CAPS    lisinopril -hydrochlorothiazide  (ZESTORETIC ) 20-12.5 MG tablet Take 1 tablet by mouth once daily   naproxen sodium (ANAPROX)  220 MG tablet Take 220 mg by mouth 2 (two) times daily with a meal. PRN   Pyridoxine HCl (VITAMIN B6) 200 MG TABS Take 100 mg by mouth 2 (two) times daily.   rOPINIRole  (REQUIP ) 0.25 MG tablet TAKE 1 TABLET (0.25 MG TOTAL) BY MOUTH AT BEDTIME AS NEEDED. FOR RESTLESS LEG SYMPTOMS   temazepam  (RESTORIL ) 15 MG capsule Take 15 mg by mouth at bedtime as needed for sleep.   [DISCONTINUED] Glucose Blood (ACCU-CHEK GUIDE TEST VI) by In Vitro route.   [DISCONTINUED] glucose blood test strip    [DISCONTINUED] metFORMIN  (GLUCOPHAGE ) 1000 MG tablet Take 1 tablet (1,000 mg total) by mouth 2 (two) times daily.   [DISCONTINUED] simvastatin  (ZOCOR ) 20 MG tablet Take 1 tablet (20 mg total) by mouth daily.   [DISCONTINUED] tirzepatide  (MOUNJARO ) 10 MG/0.5ML Pen INJECT 10MG  SUBCUTANEOUSLY  ONCE A WEEK       10/01/2023   10:40 AM 07/27/2023   10:04 AM 05/29/2023   11:46 AM  04/13/2023   10:57 AM  GAD 7 : Generalized Anxiety Score  Nervous, Anxious, on Edge 0 0 0 0  Control/stop worrying 0 0 0 0  Worry too much - different things 0 0 0 0  Trouble relaxing 0 0 0 0  Restless 0 0 0 0  Easily annoyed or irritable 0 0 0 0  Afraid - awful might happen 0 0 0 0  Total GAD 7 Score 0 0 0 0  Anxiety Difficulty Not difficult at all Not difficult at all Not difficult at all Not difficult at all       10/01/2023   10:40 AM 08/01/2023   11:39 AM 07/27/2023   10:04 AM  Depression screen PHQ 2/9  Decreased Interest 0 0 0  Down, Depressed, Hopeless 0 0 0  PHQ - 2 Score 0 0 0  Altered sleeping 0 0 0  Tired, decreased energy 0 0 0  Change in appetite 0 0 0  Feeling bad or failure about yourself  0 0 0  Trouble concentrating 0 0 0  Moving slowly or fidgety/restless 0 0 0  Suicidal thoughts 0 0 0  PHQ-9 Score 0 0 0  Difficult doing work/chores Not difficult at all Not difficult at all Not difficult at all    BP Readings from Last 3 Encounters:  10/01/23 118/70  07/27/23 124/70  05/29/23 126/74    Physical  Exam Vitals and nursing note reviewed.  Constitutional:      General: She is not in acute distress.    Appearance: She is well-developed.  HENT:     Head: Normocephalic and atraumatic.  Cardiovascular:     Rate and Rhythm: Normal rate and regular rhythm.     Heart sounds: No murmur heard. Pulmonary:     Effort: Pulmonary effort is normal. No respiratory distress.     Breath sounds: No wheezing or rhonchi.  Musculoskeletal:     Cervical back: Normal range of motion.     Right lower leg: No edema.     Left lower leg: No edema.  Skin:    General: Skin is warm and dry.     Findings: No rash.  Neurological:     Mental Status: She is alert and oriented to person, place, and time.  Psychiatric:        Mood and Affect: Mood normal.        Behavior: Behavior normal.     Wt Readings from Last 3 Encounters:  10/01/23 188 lb (85.3 kg)  08/01/23 194 lb (88 kg)  07/27/23 194 lb (88 kg)    BP 118/70   Pulse 85   Ht 5' 3 (1.6 m)   Wt 188 lb (85.3 kg)   SpO2 97%   BMI 33.30 kg/m   Assessment and Plan:  Problem List Items Addressed This Visit       Unprioritized   Essential (primary) hypertension (Chronic)   Blood pressure is well controlled on lisinopril  and hydrochlorothiazide . She has had some lightheadedness on standing recently.  Monitor BP at home and if <120 will reduce the dose of lisinopril .         Relevant Medications   simvastatin  (ZOCOR ) 20 MG tablet   Hyperlipidemia associated with type 2 diabetes mellitus (HCC) (Chronic)   LDL is  Lab Results  Component Value Date   LDLCALC 59 07/27/2023   Currently taking simvastatin .  No medication side effects or other concerns. Recommended LDL goal is < 70.  Relevant Medications   metFORMIN  (GLUCOPHAGE ) 1000 MG tablet   simvastatin  (ZOCOR ) 20 MG tablet   tirzepatide  (MOUNJARO ) 10 MG/0.5ML Pen   Migraine without aura and without status migrainosus, not intractable   Relevant Medications   simvastatin   (ZOCOR ) 20 MG tablet   Type II diabetes mellitus with complication (HCC) - Primary (Chronic)   Blood sugars have been stable.  No hypoglycemic events since last visit. Currently medications are Mounjaro  10 mg and MTF.SABRA Last visit medical regimen changes were none.  Weight is down another 6 lbs. Lab Results  Component Value Date   HGBA1C 7.0 (A) 05/29/2023  A1C today = 5.1       Relevant Medications   glucose blood (ACCU-CHEK GUIDE TEST) test strip   metFORMIN  (GLUCOPHAGE ) 1000 MG tablet   simvastatin  (ZOCOR ) 20 MG tablet   tirzepatide  (MOUNJARO ) 10 MG/0.5ML Pen   Other Visit Diagnoses       Long-term current use of injectable noninsulin antidiabetic medication           No follow-ups on file.    Leita HILARIO Adie, MD [facility] [place]

## 2023-10-01 NOTE — Patient Instructions (Addendum)
 Monitor your blood pressure at home for the next few weeks.  If consistently lower than 120 systolic, let me know and I will reduce the BP medications dose.  Try [place] sample for the next few migraines.  Take no more than one tablet per day.

## 2023-10-01 NOTE — Assessment & Plan Note (Signed)
 LDL is  Lab Results  Component Value Date   LDLCALC 59 07/27/2023   Currently taking simvastatin .  No medication side effects or other concerns. Recommended LDL goal is < 70.

## 2023-10-01 NOTE — Assessment & Plan Note (Addendum)
 Blood pressure is well controlled on lisinopril  and hydrochlorothiazide . She has had some lightheadedness on standing recently.  Monitor BP at home and if <120 will reduce the dose of lisinopril .

## 2023-10-01 NOTE — Assessment & Plan Note (Addendum)
 Blood sugars have been stable.  No hypoglycemic events since last visit. Currently medications are Mounjaro  10 mg and MTF.SABRA Last visit medical regimen changes were none.  Weight is down another 6 lbs. Lab Results  Component Value Date   HGBA1C 7.0 (A) 05/29/2023  A1C today = 5.1

## 2023-10-07 ENCOUNTER — Encounter: Payer: Self-pay | Admitting: Internal Medicine

## 2023-10-08 NOTE — Progress Notes (Unsigned)
 2225   Date:  05/24/2023   Name:  Nancy Chen   DOB:  [birth year]   MRN:  [id number]   Chief Complaint: No chief complaint on file.  HPI  Review of Systems   Lab Results  Component Value Date   NA 140 07/27/2023   K 4.1 07/27/2023   CO2 23 07/27/2023   GLUCOSE 87 07/27/2023   BUN 24 07/27/2023   CREATININE 0.88 07/27/2023   CALCIUM 10.2 07/27/2023   EGFR 68 07/27/2023   GFRNONAA 80 11/06/2019   Lab Results  Component Value Date   CHOL 131 07/27/2023   HDL 43 07/27/2023   LDLCALC 59 07/27/2023   TRIG 175 (H) 07/27/2023   CHOLHDL 3.0 07/27/2023   Lab Results  Component Value Date   TSH 1.000 07/27/2023   Lab Results  Component Value Date   HGBA1C 7.0 (A) 05/29/2023   Lab Results  Component Value Date   WBC 4.9 07/27/2023   HGB 14.0 07/27/2023   HCT 42.9 07/27/2023   MCV 89 07/27/2023   PLT 228 07/27/2023   Lab Results  Component Value Date   ALT 21 07/27/2023   AST 22 07/27/2023   ALKPHOS 47 07/27/2023   BILITOT 0.6 07/27/2023   Lab Results  Component Value Date   VD25OH 35.5 07/27/2023     Patient Active Problem List   Diagnosis Date Noted   Restless leg syndrome 10/24/2022   Primary insomnia 10/24/2022   Vitamin D  deficiency 07/25/2022   Heart palpitations 02/15/2022   Aortic atherosclerosis 07/15/2019   Facial basal cell cancer 10/31/2016   Migraine without aura and without status migrainosus, not intractable 10/31/2016   Lipoma of left upper extremity 10/11/2015   Type II diabetes mellitus with complication (HCC) 02/11/2015   Ovarian failure 11/10/2014   Essential (primary) hypertension 07/15/2014   Hyperlipidemia associated with type 2 diabetes mellitus (HCC) 07/15/2014   Tricompartment osteoarthritis of knee 07/15/2014    Allergies  Allergen Reactions   Glipizide Swelling    hives   Latex    Codeine Hives    Past Surgical History:  Procedure Laterality Date   ABDOMINAL HYSTERECTOMY     BASAL CELL CARCINOMA EXCISION      CARPAL TUNNEL RELEASE Bilateral    CARPAL TUNNEL RELEASE     CATARACT EXTRACTION W/PHACO Left 09/07/2016   Procedure: CATARACT EXTRACTION PHACO AND INTRAOCULAR LENS PLACEMENT (IOC);  Surgeon: Myrna Adine Anes, MD;  Location: [facility];  Service: Ophthalmology;  Laterality: Left;  US  00:41.9 AP% 14.9 CDE 6.23 Fluid pack lot # [id number]   CATARACT EXTRACTION W/PHACO Right 10/19/2016   Procedure: CATARACT EXTRACTION PHACO AND INTRAOCULAR LENS PLACEMENT (IOC);  Surgeon: Myrna Adine Anes, MD;  Location: [facility];  Service: Ophthalmology;  Laterality: Right;  casette lot # [id number] H US   00:59.5 AP%   16.5 CDE   9.79    COLONOSCOPY  06/2012   normal (in [place])   EYE SURGERY     TONSILLECTOMY     TUBAL LIGATION      Social History   Tobacco Use   Smoking status: Never    Passive exposure: Past   Smokeless tobacco: Never   Tobacco comments:    smoking cessation materials not required  Vaping Use   Vaping status: Never Used  Substance Use Topics   Alcohol use: Not Currently    Alcohol/week: 1.0 standard drink of alcohol    Types: 1 Glasses of wine per week    Comment: once monthly maybe,  last use 2 years ago   Drug use: Never     Medication list has been reviewed and updated.  Current Meds  Medication Sig   [DISCONTINUED] tirzepatide  (MOUNJARO ) 5 MG/0.5ML Pen Inject 5 mg into the skin once a week.       10/01/2023   10:40 AM 07/27/2023   10:04 AM 05/29/2023   11:46 AM 04/13/2023   10:57 AM  GAD 7 : Generalized Anxiety Score  Nervous, Anxious, on Edge 0 0 0 0  Control/stop worrying 0 0 0 0  Worry too much - different things 0 0 0 0  Trouble relaxing 0 0 0 0  Restless 0 0 0 0  Easily annoyed or irritable 0 0 0 0  Afraid - awful might happen 0 0 0 0  Total GAD 7 Score 0 0 0 0  Anxiety Difficulty Not difficult at all Not difficult at all Not difficult at all Not difficult at all       10/01/2023   10:40 AM 08/01/2023   11:39 AM 07/27/2023   10:04 AM  Depression screen PHQ  2/9  Decreased Interest 0 0 0  Down, Depressed, Hopeless 0 0 0  PHQ - 2 Score 0 0 0  Altered sleeping 0 0 0  Tired, decreased energy 0 0 0  Change in appetite 0 0 0  Feeling bad or failure about yourself  0 0 0  Trouble concentrating 0 0 0  Moving slowly or fidgety/restless 0 0 0  Suicidal thoughts 0 0 0  PHQ-9 Score 0 0 0  Difficult doing work/chores Not difficult at all Not difficult at all Not difficult at all    BP Readings from Last 3 Encounters:  10/01/23 118/70  07/27/23 124/70  05/29/23 126/74    Physical Exam  Wt Readings from Last 3 Encounters:  10/01/23 188 lb (85.3 kg)  08/01/23 194 lb (88 kg)  07/27/23 194 lb (88 kg)    There were no vitals taken for this visit.  Assessment and Plan:  Problem List Items Addressed This Visit       Unprioritized   Type II diabetes mellitus with complication (HCC) - Primary (Chronic)    No follow-ups on file.    Leita HILARIO Adie, MD [facility] [place]

## 2023-10-17 NOTE — Telephone Encounter (Signed)
 Please review.  KP

## 2023-10-29 ENCOUNTER — Encounter: Payer: Self-pay | Admitting: Cardiology

## 2023-10-29 ENCOUNTER — Ambulatory Visit: Attending: Cardiology | Admitting: Cardiology

## 2023-10-29 VITALS — BP 110/60 | HR 76 | Ht 62.0 in | Wt 198.2 lb

## 2023-10-29 DIAGNOSIS — I350 Nonrheumatic aortic (valve) stenosis: Secondary | ICD-10-CM | POA: Insufficient documentation

## 2023-10-29 DIAGNOSIS — I10 Essential (primary) hypertension: Secondary | ICD-10-CM | POA: Diagnosis not present

## 2023-10-29 DIAGNOSIS — E7800 Pure hypercholesterolemia, unspecified: Secondary | ICD-10-CM | POA: Diagnosis not present

## 2023-10-29 MED ORDER — METFORMIN HCL 1000 MG PO TABS: 1000.0000 mg | ORAL_TABLET | Freq: Every day | ORAL | 1 refills | Status: AC

## 2023-10-29 MED ORDER — LISINOPRIL-HYDROCHLOROTHIAZIDE 20-12.5 MG PO TABS: 0.5000 | ORAL_TABLET | Freq: Every day | ORAL | 0 refills | Status: DC

## 2023-10-29 NOTE — Progress Notes (Signed)
 Cardiology Office Note:    Date:  10/29/2023   ID:  Nancy Chen, DOB August 30, 1945, MRN [id number]  PCP:  Justus Leita DEL, MD   [facility] Providers Cardiologist:  Redell Cave, MD     Referring MD: Justus Leita DEL, MD   Chief Complaint  Patient presents with   Follow-up    12 month follow up pt has been doing well with no complaints of chest pain, chest pressure or SOB, medciation reviewed verbally with patient    History of Present Illness:    Nancy Chen is a 78 y.o. female with a hx of hypertension, hyperlipidemia, mild aortic valve stenosis, diabetes who presents for follow-up.    Doing okay, denies chest pain or shortness of breath.  Started on Mounjaro  for diabetes management per PCP.  Blood sugars and A1c abnormal adequately controlled, has also lost about 20 pounds in the past 3 months.  Her blood pressure have improved, currently takes half a tab of Zestoretic .  Feels well, has no concerns at this time.    Prior notes Echo 5/24 EF 60 to 65%, mild aortic valve stenosis  has a family history of MI with father having heart attack in his 14s, her son recently had coronary artery disease requiring stents, also in his 31s.    Moved to the area from [place] , states having a stress test about 10 years ago.   Past Medical History:  Diagnosis Date   Allergy    Anemia    Came from menstrual issues   Anxiety    Arthritis    Bronchitis    Cataract 2018   Clotting disorder When I was teenager   Menstral   COPD (chronic obstructive pulmonary disease) (HCC)    Diabetes mellitus without complication (HCC)    Facial basal cell cancer 10/31/2016   also left lower eyelid, chest and shoulder   Hypercholesteremia    Hypertension    Long-term use of high-risk medication 11/10/2014   Actos  started 11/2014    Lower extremity edema     Past Surgical History:  Procedure Laterality Date   ABDOMINAL HYSTERECTOMY     BASAL CELL CARCINOMA EXCISION      CARPAL TUNNEL RELEASE Bilateral    CARPAL TUNNEL RELEASE     CATARACT EXTRACTION W/PHACO Left 09/07/2016   Procedure: CATARACT EXTRACTION PHACO AND INTRAOCULAR LENS PLACEMENT (IOC);  Surgeon: Myrna Adine Anes, MD;  Location: [facility] ORS;  Service: Ophthalmology;  Laterality: Left;  US  00:41.9 AP% 14.9 CDE 6.23 Fluid pack lot # [id number]   CATARACT EXTRACTION W/PHACO Right 10/19/2016   Procedure: CATARACT EXTRACTION PHACO AND INTRAOCULAR LENS PLACEMENT (IOC);  Surgeon: Myrna Adine Anes, MD;  Location: [facility] ORS;  Service: Ophthalmology;  Laterality: Right;  casette lot # [id number] H US   00:59.5 AP%   16.5 CDE   9.79    COLONOSCOPY  06/2012   normal (in [place])   EYE SURGERY     TONSILLECTOMY     TUBAL LIGATION      Current Medications: Current Meds  Medication Sig   Acetaminophen (TYLENOL ARTHRITIS PAIN PO) Take 1 capsule by mouth 2 (two) times daily. (Patient taking differently: Take 1 capsule by mouth 2 (two) times daily between meals as needed. PRN)   aspirin 81 MG chewable tablet Chew by mouth daily.   cetirizine (ZYRTEC) 10 MG tablet Take 10 mg by mouth as needed.    Cholecalciferol (D3-1000) 25 MCG (1000 UT) capsule    Cyanocobalamin  (B-12) 1000 MCG  CAPS    glucose blood (ACCU-CHEK GUIDE TEST) test strip by In Vitro route. Use to test blood sugar once daily.   naproxen sodium (ANAPROX) 220 MG tablet Take 220 mg by mouth 2 (two) times daily with a meal. PRN   Pyridoxine HCl (VITAMIN B6) 200 MG TABS Take 100 mg by mouth 2 (two) times daily.   rOPINIRole  (REQUIP ) 0.25 MG tablet TAKE 1 TABLET (0.25 MG TOTAL) BY MOUTH AT BEDTIME AS NEEDED. FOR RESTLESS LEG SYMPTOMS   simvastatin  (ZOCOR ) 20 MG tablet Take 1 tablet (20 mg total) by mouth daily.   temazepam  (RESTORIL ) 15 MG capsule Take 15 mg by mouth at bedtime as needed for sleep.   tirzepatide  (MOUNJARO ) 10 MG/0.5ML Pen Inject 10 mg into the skin once a week.   [DISCONTINUED] lisinopril -hydrochlorothiazide  (ZESTORETIC ) 20-12.5 MG tablet  Take 1 tablet by mouth once daily   [DISCONTINUED] metFORMIN  (GLUCOPHAGE ) 1000 MG tablet Take 1 tablet (1,000 mg total) by mouth 2 (two) times daily.     Allergies:   Glipizide, Latex, and Codeine   Social History   Socioeconomic History   Marital status: Widowed    Spouse name: Not on file   Number of children: 1   Years of education: Not on file   Highest education level: 12th grade  Occupational History   Occupation: Retired  Tobacco Use   Smoking status: Never    Passive exposure: Past   Smokeless tobacco: Never   Tobacco comments:    smoking cessation materials not required  Vaping Use   Vaping status: Never Used  Substance and Sexual Activity   Alcohol use: Not Currently    Alcohol/week: 1.0 standard drink of alcohol    Types: 1 Glasses of wine per week    Comment: once monthly maybe, last use 2 years ago   Drug use: Never   Sexual activity: Not Currently    Birth control/protection: None  Other Topics Concern   Not on file  Social History Narrative   Pt lives alone   Social Drivers of Health   Financial Resource Strain: Low Risk  (08/01/2023)   Overall Financial Resource Strain (CARDIA)    Difficulty of Paying Living Expenses: Not hard at all  Food Insecurity: No Food Insecurity (08/01/2023)   Hunger Vital Sign    Worried About Running Out of Food in the Last Year: Never true    Ran Out of Food in the Last Year: Never true  Transportation Needs: No Transportation Needs (08/01/2023)   PRAPARE - Administrator, Civil Service (Medical): No    Lack of Transportation (Non-Medical): No  Physical Activity: Inactive (08/01/2023)   Exercise Vital Sign    Days of Exercise per Week: 0 days    Minutes of Exercise per Session: 0 min  Stress: No Stress Concern Present (08/01/2023)   [employer] of Occupational Health - Occupational Stress Questionnaire    Feeling of Stress: Only a little  Social Connections: Socially Isolated (08/01/2023)   Social  Connection and Isolation Panel    Frequency of Communication with Friends and Family: More than three times a week    Frequency of Social Gatherings with Friends and Family: More than three times a week    Attends Religious Services: Never    Database administrator or Organizations: No    Attends Banker Meetings: Never    Marital Status: Widowed     Family History: The patient's family history includes Alzheimer's disease in her  mother; Arthritis in her mother; Breast cancer in her maternal grandmother; CAD in her father; Diabetes in her maternal aunt; Heart attack in her child and paternal uncle; Heart disease in her son; Kidney disease in her father and maternal aunt; Miscarriages / Stillbirths in her maternal grandmother and mother; Obesity in her maternal aunt and maternal grandmother; Stroke in her father.  ROS:   Please see the history of present illness.     All other systems reviewed and are negative.  EKGs/Labs/Other Studies Reviewed:    The following studies were reviewed today:   EKG:  EKG is  ordered today.  The ekg ordered today demonstrates normal sinus rhythm, with occasional PVCs.  Recent Labs: 07/27/2023: ALT 21; BUN 24; Creatinine, Ser 0.88; Hemoglobin 14.0; Platelets 228; Potassium 4.1; Sodium 140; TSH 1.000  Recent Lipid Panel    Component Value Date/Time   CHOL 131 07/27/2023 1140   TRIG 175 (H) 07/27/2023 1140   HDL 43 07/27/2023 1140   CHOLHDL 3.0 07/27/2023 1140   LDLCALC 59 07/27/2023 1140     Risk Assessment/Calculations:             Physical Exam:    VS:  BP 110/60 (BP Location: Left Arm, Patient Position: Sitting, Cuff Size: Normal)   Pulse 76   Ht 5' 2 (1.575 m)   Wt 198 lb 3.2 oz (89.9 kg)   SpO2 98%   BMI 36.25 kg/m     Wt Readings from Last 3 Encounters:  10/29/23 198 lb 3.2 oz (89.9 kg)  10/01/23 188 lb (85.3 kg)  08/01/23 194 lb (88 kg)     GEN:  Well nourished, well developed in no acute distress HEENT:  Normal NECK: No JVD; No carotid bruits CARDIAC: RRR, 2/6 systolic murmur RESPIRATORY:  Clear to auscultation without rales, wheezing or rhonchi  ABDOMEN: Soft, non-tender, non-distended MUSCULOSKELETAL:  No edema; No deformity  SKIN: Warm and dry NEUROLOGIC:  Alert and oriented x 3 PSYCHIATRIC:  Normal affect   ASSESSMENT:    1. Primary hypertension   2. Pure hypercholesterolemia   3. Mild aortic stenosis    PLAN:    In order of problems listed above:  Hypertension, BP controlled.  Continue lisinopril -HCTZ 20-12.5 mg half a tab daily.  Consider prescribing lisinopril  10 mg daily only if BP becomes low with weight loss on Mounjaro . Hyperlipidemia, LDL at goal, continue simvastatin  20 mg daily. Mild aortic valve stenosis, reason for systolic murmur on exam.  Monitor serially with echoes.  Follow-up in 12 months.     Medication Adjustments/Labs and Tests Ordered: Current medicines are reviewed at length with the patient today.  Concerns regarding medicines are outlined above.  Orders Placed This Encounter  Procedures   EKG 12-Lead   Meds ordered this encounter  Medications   lisinopril -hydrochlorothiazide  (ZESTORETIC ) 20-12.5 MG tablet    Sig: Take 0.5 tablets by mouth daily. 1/2 tablet    Dispense:  90 tablet    Refill:  0   metFORMIN  (GLUCOPHAGE ) 1000 MG tablet    Sig: Take 1 tablet (1,000 mg total) by mouth daily for 1 dose. 1 tablet at night    Dispense:  180 tablet    Refill:  1    Patient Instructions  Medication Instructions:  Your physician recommends that you continue on your current medications as directed. Please refer to the Current Medication list given to you today.   *If you need a refill on your cardiac medications before your next appointment, please call your  pharmacy*  Lab Work: No labs ordered today  If you have labs (blood work) drawn today and your tests are completely normal, you will receive your results only by: MyChart Message (if you have  MyChart) OR A paper copy in the mail If you have any lab test that is abnormal or we need to change your treatment, we will call you to review the results.  Testing/Procedures: No test ordered today   Follow-Up: At [facility], you and your health needs are our priority.  As part of our continuing mission to provide you with exceptional heart care, our providers are all part of one team.  This team includes your primary Cardiologist (physician) and Advanced Practice Providers or APPs (Physician Assistants and Nurse Practitioners) who all work together to provide you with the care you need, when you need it.  Your next appointment:   1 year(s)  Provider:   You may see Redell Cave, MD or one of the following Advanced Practice Providers on your designated Care Team:   Lonni Meager, NP Lesley Maffucci, PA-C Bernardino Bring, PA-C Cadence Groveland, PA-C Tylene Lunch, NP Barnie Hila, NP    We recommend signing up for the patient portal called MyChart.  Sign up information is provided on this After Visit Summary.  MyChart is used to connect with patients for Virtual Visits (Telemedicine).  Patients are able to view lab/test results, encounter notes, upcoming appointments, etc.  Non-urgent messages can be sent to your provider as well.   To learn more about what you can do with MyChart, go to [email or web address].         Signed, Redell Cave, MD  10/29/2023 3:06 PM    [facility]

## 2023-10-29 NOTE — Patient Instructions (Signed)

## 2023-11-05 ENCOUNTER — Other Ambulatory Visit: Payer: Self-pay | Admitting: Internal Medicine

## 2023-11-05 DIAGNOSIS — G2581 Restless legs syndrome: Secondary | ICD-10-CM

## 2023-11-06 ENCOUNTER — Other Ambulatory Visit: Payer: Self-pay | Admitting: Internal Medicine

## 2023-11-06 NOTE — Telephone Encounter (Unsigned)
 Copied from CRM [id number]. Topic: Clinical - Medication Refill >> Nov 06, 2023 10:57 AM Myrick T wrote: Medication:  temazepam  (RESTORIL ) 15 MG capsule   Has the patient contacted their pharmacy? No  This is the patient's preferred pharmacy:  [facility] [address] [place] - [address] [place] [address] Phone: [phone] Fax: [phone]  Is this the correct pharmacy for this prescription? Yes  Has the prescription been filled recently? Yes  Is the patient out of the medication? No  Has the patient been seen for an appointment in the last year OR does the patient have an upcoming appointment? Yes  Can we respond through MyChart? Yes  Agent: Please be advised that Rx refills may take up to 3 business days. We ask that you follow-up with your pharmacy.

## 2023-11-07 NOTE — Telephone Encounter (Signed)
 Requested Prescriptions  Pending Prescriptions Disp Refills   rOPINIRole  (REQUIP ) 0.25 MG tablet [Pharmacy Med Name: rOPINIRole  HCl 0.25 MG Oral Tablet] 90 tablet 1    Sig: TAKE 1 TABLET BY MOUTH AT BEDTIME AS NEEDED FOR  RESTLESS  LEG  SYMPTOMS     Neurology:  Parkinsonian Agents Passed - 11/07/2023 11:31 AM      Passed - Last BP in normal range    BP Readings from Last 1 Encounters:  10/29/23 110/60         Passed - Last Heart Rate in normal range    Pulse Readings from Last 1 Encounters:  10/29/23 76         Passed - Valid encounter within last 12 months    Recent Outpatient Visits           1 month ago Type II diabetes mellitus with complication (HCC)   [place] [facility], Leita DEL, MD   3 months ago Essential (primary) hypertension   [place] [facility] at [facility], Leita DEL, MD   5 months ago Essential (primary) hypertension   [place] [facility] at [facility], Leita DEL, MD   6 months ago Type II diabetes mellitus with complication [facility])    Primary Care & Sports Medicine at [facility], Leita DEL, MD   6 months ago Vulvovaginitis   [facility] at [facility] Cathryne JAYSON, MD

## 2023-11-08 ENCOUNTER — Telehealth: Payer: Self-pay | Admitting: Internal Medicine

## 2023-11-08 NOTE — Telephone Encounter (Signed)
 Requested medications are due for refill today.  unsure  Requested medications are on the active medications list.  yes  Last refill. 08/01/2023  Future visit scheduled.   yes  Notes to clinic.  Medication is historical. Refill not delegated.    Requested Prescriptions  Pending Prescriptions Disp Refills   temazepam  (RESTORIL ) 15 MG capsule 30 capsule     Sig: Take 1 capsule (15 mg total) by mouth at bedtime as needed for sleep.     Not Delegated - Psychiatry: Anxiolytics/Hypnotics 2 Failed - 11/08/2023 10:53 AM      Failed - This refill cannot be delegated      Failed - Urine Drug Screen completed in last 360 days      Passed - Patient is not pregnant      Passed - Valid encounter within last 6 months    Recent Outpatient Visits           1 month ago Type II diabetes mellitus with complication [facility])   [place] [facility] at [facility], Leita DEL, MD   3 months ago Essential (primary) hypertension   [place] [facility] at [facility], Leita DEL, MD   5 months ago Essential (primary) hypertension   [place] [facility] at [facility], Leita DEL, MD   6 months ago Type II diabetes mellitus with complication [facility])   [place] [facility] Medicine at [facility], Leita DEL, MD   6 months ago Vulvovaginitis   [place] [facility] at [facility] Cathryne JAYSON, MD

## 2023-11-08 NOTE — Telephone Encounter (Signed)
 Noted  Refill request was sent to Dr. Justus.  KP

## 2023-11-08 NOTE — Telephone Encounter (Signed)
 Please review.  KP

## 2023-11-08 NOTE — Telephone Encounter (Signed)
 Copied from CRM [id number]. Topic: Clinical - Medication Refill >> Nov 06, 2023 10:57 AM Myrick T wrote: Medication:  temazepam  (RESTORIL ) 15 MG capsule   Has the patient contacted their pharmacy? No  This is the patient's preferred pharmacy:  [facility] [address], [place] - [address] [place] [address] Phone: [phone] Fax: [phone]  Is this the correct pharmacy for this prescription? Yes  Has the prescription been filled recently? Yes  Is the patient out of the medication? No  Has the patient been seen for an appointment in the last year OR does the patient have an upcoming appointment? Yes  Can we respond through MyChart? Yes  Agent: Please be advised that Rx refills may take up to 3 business days. We ask that you follow-up with your pharmacy. >> Nov 08, 2023 10:12 AM Zebedee SAUNDERS wrote: Pt's last appt was on 10/01/2023 with Dr. Leita Adie. Refill for temazepam  (RESTORIL ) 15 MG capsule [facility] [address] [place] - [address] [place] [address] Phone: [phone] Fax: [phone]

## 2023-11-09 ENCOUNTER — Telehealth: Payer: Self-pay | Admitting: Internal Medicine

## 2023-11-09 ENCOUNTER — Other Ambulatory Visit: Payer: Self-pay | Admitting: Internal Medicine

## 2023-11-09 DIAGNOSIS — F5101 Primary insomnia: Secondary | ICD-10-CM

## 2023-11-09 MED ORDER — TEMAZEPAM 15 MG PO CAPS
15.0000 mg | ORAL_CAPSULE | Freq: Every evening | ORAL | 1 refills | Status: AC | PRN
Start: 2023-11-09 — End: ?

## 2023-11-09 NOTE — Telephone Encounter (Signed)
 Copied from CRM [id number]. Topic: Clinical - Medication Refill >> Nov 06, 2023 10:57 AM Myrick T wrote: Medication:  temazepam  (RESTORIL ) 15 MG capsule   Has the patient contacted their pharmacy? No  This is the patient's preferred pharmacy:  [facility] [address], [place] - [address] [place] [address] Phone: [phone] Fax: [phone]  Is this the correct pharmacy for this prescription? Yes  Has the prescription been filled recently? Yes  Is the patient out of the medication? No  Has the patient been seen for an appointment in the last year OR does the patient have an upcoming appointment? Yes  Can we respond through MyChart? Yes  Agent: Please be advised that Rx refills may take up to 3 business days. We ask that you follow-up with your pharmacy. >> Nov 09, 2023 10:46 AM Emylou G wrote: Patient called checking status of refill >> Nov 08, 2023 10:12 AM Zebedee SAUNDERS wrote: Pt's last appt was on 10/01/2023 with Dr. Leita Adie. Refill for temazepam  (RESTORIL ) 15 MG capsule [facility] [address] [place] - [address] [place] [address] Phone: [phone] Fax: [phone]

## 2023-11-09 NOTE — Telephone Encounter (Signed)
 Called pt she wants the medication sent in as needed to help her sleep. She wants it sent to [facility].  KP

## 2023-11-09 NOTE — Telephone Encounter (Signed)
 Please review.  KP

## 2023-11-09 NOTE — Progress Notes (Unsigned)
 Date:  11/09/2023   Name:  Nancy Chen   DOB:  07-Jun-1945   MRN:  [id number]   Chief Complaint: No chief complaint on file.  HPI  Review of Systems   Lab Results  Component Value Date   NA 140 07/27/2023   K 4.1 07/27/2023   CO2 23 07/27/2023   GLUCOSE 87 07/27/2023   BUN 24 07/27/2023   CREATININE 0.88 07/27/2023   CALCIUM 10.2 07/27/2023   EGFR 68 07/27/2023   GFRNONAA 80 11/06/2019   Lab Results  Component Value Date   CHOL 131 07/27/2023   HDL 43 07/27/2023   LDLCALC 59 07/27/2023   TRIG 175 (H) 07/27/2023   CHOLHDL 3.0 07/27/2023   Lab Results  Component Value Date   TSH 1.000 07/27/2023   Lab Results  Component Value Date   HGBA1C 5.1 10/01/2023   Lab Results  Component Value Date   WBC 4.9 07/27/2023   HGB 14.0 07/27/2023   HCT 42.9 07/27/2023   MCV 89 07/27/2023   PLT 228 07/27/2023   Lab Results  Component Value Date   ALT 21 07/27/2023   AST 22 07/27/2023   ALKPHOS 47 07/27/2023   BILITOT 0.6 07/27/2023   Lab Results  Component Value Date   VD25OH 35.5 07/27/2023     Patient Active Problem List   Diagnosis Date Noted   Restless leg syndrome 10/24/2022   Primary insomnia 10/24/2022   Vitamin D  deficiency 07/25/2022   Heart palpitations 02/15/2022   Aortic atherosclerosis 07/15/2019   Facial basal cell cancer 10/31/2016   Migraine without aura and without status migrainosus, not intractable 10/31/2016   Lipoma of left upper extremity 10/11/2015   Type II diabetes mellitus with complication (HCC) 02/11/2015   Ovarian failure 11/10/2014   Essential (primary) hypertension 07/15/2014   Hyperlipidemia associated with type 2 diabetes mellitus (HCC) 07/15/2014   Tricompartment osteoarthritis of knee 07/15/2014    Allergies  Allergen Reactions   Glipizide Swelling    hives   Latex    Codeine Hives    Past Surgical History:  Procedure Laterality Date   ABDOMINAL HYSTERECTOMY     BASAL CELL CARCINOMA EXCISION     CARPAL  TUNNEL RELEASE Bilateral    CARPAL TUNNEL RELEASE     CATARACT EXTRACTION W/PHACO Left 09/07/2016   Procedure: CATARACT EXTRACTION PHACO AND INTRAOCULAR LENS PLACEMENT (IOC);  Surgeon: Myrna Adine Anes, MD;  Location: [facility] ORS;  Service: Ophthalmology;  Laterality: Left;  US  00:41.9 AP% 14.9 CDE 6.23 Fluid pack lot # [id number]   CATARACT EXTRACTION W/PHACO Right 10/19/2016   Procedure: CATARACT EXTRACTION PHACO AND INTRAOCULAR LENS PLACEMENT (IOC);  Surgeon: Myrna Adine Anes, MD;  Location: [facility] ORS;  Service: Ophthalmology;  Laterality: Right;  casette lot # [id number] H US   00:59.5 AP%   16.5 CDE   9.79    COLONOSCOPY  06/2012   normal (in [place])   EYE SURGERY     TONSILLECTOMY     TUBAL LIGATION      Social History   Tobacco Use   Smoking status: Never    Passive exposure: Past   Smokeless tobacco: Never   Tobacco comments:    smoking cessation materials not required  Vaping Use   Vaping status: Never Used  Substance Use Topics   Alcohol use: Not Currently    Alcohol/week: 1.0 standard drink of alcohol    Types: 1 Glasses of wine per week    Comment: once monthly maybe, last use  2 years ago   Drug use: Never     Medication list has been reviewed and updated.  No outpatient medications have been marked as taking for the 11/09/23 encounter (Orders Only) with Justus Leita DEL, MD.       10/01/2023   10:40 AM 07/27/2023   10:04 AM 05/29/2023   11:46 AM 04/13/2023   10:57 AM  GAD 7 : Generalized Anxiety Score  Nervous, Anxious, on Edge 0 0 0 0  Control/stop worrying 0 0 0 0  Worry too much - different things 0 0 0 0  Trouble relaxing 0 0 0 0  Restless 0 0 0 0  Easily annoyed or irritable 0 0 0 0  Afraid - awful might happen 0 0 0 0  Total GAD 7 Score 0 0 0 0  Anxiety Difficulty Not difficult at all Not difficult at all Not difficult at all Not difficult at all       10/01/2023   10:40 AM 08/01/2023   11:39 AM 07/27/2023   10:04 AM  Depression screen PHQ 2/9   Decreased Interest 0 0 0  Down, Depressed, Hopeless 0 0 0  PHQ - 2 Score 0 0 0  Altered sleeping 0 0 0  Tired, decreased energy 0 0 0  Change in appetite 0 0 0  Feeling bad or failure about yourself  0 0 0  Trouble concentrating 0 0 0  Moving slowly or fidgety/restless 0 0 0  Suicidal thoughts 0 0 0  PHQ-9 Score 0 0 0  Difficult doing work/chores Not difficult at all Not difficult at all Not difficult at all    BP Readings from Last 3 Encounters:  10/29/23 110/60  10/01/23 118/70  07/27/23 124/70    Physical Exam  Wt Readings from Last 3 Encounters:  10/29/23 189 lb (85.7 kg)  10/01/23 188 lb (85.3 kg)  08/01/23 194 lb (88 kg)    There were no vitals taken for this visit.  Assessment and Plan:  Problem List Items Addressed This Visit   None   No follow-ups on file.    Leita HILARIO Justus, MD [facility] Primary [facility] and [facility] Medicine [place]

## 2023-11-30 ENCOUNTER — Ambulatory Visit: Admitting: Internal Medicine

## 2023-12-10 ENCOUNTER — Ambulatory Visit: Admitting: Internal Medicine

## 2023-12-10 ENCOUNTER — Encounter: Payer: Self-pay | Admitting: Internal Medicine

## 2023-12-10 VITALS — BP 122/74 | HR 96 | Ht 62.0 in | Wt 189.0 lb

## 2023-12-10 DIAGNOSIS — Z7985 Long-term (current) use of injectable non-insulin antidiabetic drugs: Secondary | ICD-10-CM | POA: Diagnosis not present

## 2023-12-10 DIAGNOSIS — K5903 Drug induced constipation: Secondary | ICD-10-CM

## 2023-12-10 DIAGNOSIS — E118 Type 2 diabetes mellitus with unspecified complications: Secondary | ICD-10-CM

## 2023-12-10 NOTE — Progress Notes (Signed)
 Date:  12/10/2023   Name:  Nancy Chen   DOB:  07-17-1945   MRN:  [id number]   Chief Complaint: Diabetes (Patient feels she is having side effects of constipation from Mounjaro , and pain medications. She said she had a extremely painful hard stool that caused bleeding and clots. 11/24 is when this happened. On 11/28, she had another hard stool and tried Gel Stool Softeners and prune juice- this helped. )  Constipation This is a recurrent problem. The current episode started 1 to 4 weeks ago. The problem has been gradually improving since onset. Her stool frequency is 2 to 3 times per week. The stool is described as firm and formed. The patient is not on a high fiber diet. She Does not exercise regularly. There has Been adequate water intake. Associated symptoms include rectal pain. Pertinent negatives include no diarrhea, fever or melena. Risk factors include change in medication usage/dosage (since increasing Mounjaro  to 10 mg). She has tried stool softeners (prune juice) for the symptoms. The treatment provided mild relief.    Review of Systems  Constitutional:  Negative for chills, fatigue and fever.  Respiratory:  Negative for chest tightness and shortness of breath.   Cardiovascular:  Negative for chest pain.  Gastrointestinal:  Positive for abdominal distention, anal bleeding, constipation and rectal pain. Negative for diarrhea and melena.  Psychiatric/Behavioral:  Negative for dysphoric mood and sleep disturbance. The patient is not nervous/anxious.      Lab Results  Component Value Date   NA 140 07/27/2023   K 4.1 07/27/2023   CO2 23 07/27/2023   GLUCOSE 87 07/27/2023   BUN 24 07/27/2023   CREATININE 0.88 07/27/2023   CALCIUM 10.2 07/27/2023   EGFR 68 07/27/2023   GFRNONAA 80 11/06/2019   Lab Results  Component Value Date   CHOL 131 07/27/2023   HDL 43 07/27/2023   LDLCALC 59 07/27/2023   TRIG 175 (H) 07/27/2023   CHOLHDL 3.0 07/27/2023   Lab Results  Component  Value Date   TSH 1.000 07/27/2023   Lab Results  Component Value Date   HGBA1C 5.1 10/01/2023   Lab Results  Component Value Date   WBC 4.9 07/27/2023   HGB 14.0 07/27/2023   HCT 42.9 07/27/2023   MCV 89 07/27/2023   PLT 228 07/27/2023   Lab Results  Component Value Date   ALT 21 07/27/2023   AST 22 07/27/2023   ALKPHOS 47 07/27/2023   BILITOT 0.6 07/27/2023   Lab Results  Component Value Date   VD25OH 35.5 07/27/2023     Patient Active Problem List   Diagnosis Date Noted   Restless leg syndrome 10/24/2022   Primary insomnia 10/24/2022   Vitamin D  deficiency 07/25/2022   Heart palpitations 02/15/2022   Aortic atherosclerosis 07/15/2019   Facial basal cell cancer 10/31/2016   Migraine without aura and without status migrainosus, not intractable 10/31/2016   Lipoma of left upper extremity 10/11/2015   Type II diabetes mellitus with complication (HCC) 02/11/2015   Ovarian failure 11/10/2014   Essential (primary) hypertension 07/15/2014   Hyperlipidemia associated with type 2 diabetes mellitus (HCC) 07/15/2014   Tricompartment osteoarthritis of knee 07/15/2014    Allergies  Allergen Reactions   Glipizide Swelling    hives   Latex    Codeine Hives    Past Surgical History:  Procedure Laterality Date   ABDOMINAL HYSTERECTOMY     BASAL CELL CARCINOMA EXCISION     CARPAL TUNNEL RELEASE Bilateral  CARPAL TUNNEL RELEASE     CATARACT EXTRACTION W/PHACO Left 09/07/2016   Procedure: CATARACT EXTRACTION PHACO AND INTRAOCULAR LENS PLACEMENT (IOC);  Surgeon: Myrna Adine Anes, MD;  Location: [facility] ORS;  Service: Ophthalmology;  Laterality: Left;  US  00:41.9 AP% 14.9 CDE 6.23 Fluid pack lot # [id number]   CATARACT EXTRACTION W/PHACO Right 10/19/2016   Procedure: CATARACT EXTRACTION PHACO AND INTRAOCULAR LENS PLACEMENT (IOC);  Surgeon: Myrna Adine Anes, MD;  Location: [facility] ORS;  Service: Ophthalmology;  Laterality: Right;  casette lot # [id number] H US   00:59.5 AP%    16.5 CDE   9.79    COLONOSCOPY  06/2012   normal (in [place])   EYE SURGERY     TONSILLECTOMY     TUBAL LIGATION      Social History   Tobacco Use   Smoking status: Never    Passive exposure: Past   Smokeless tobacco: Never   Tobacco comments:    smoking cessation materials not required  Vaping Use   Vaping status: Never Used  Substance Use Topics   Alcohol use: Not Currently    Alcohol/week: 1.0 standard drink of alcohol    Types: 1 Glasses of wine per week    Comment: once monthly maybe, last use 2 years ago   Drug use: Never     Medication list has been reviewed and updated.  Current Meds  Medication Sig   Acetaminophen (TYLENOL ARTHRITIS PAIN PO) Take 1 capsule by mouth 2 (two) times daily. (Patient taking differently: Take 1 capsule by mouth 2 (two) times daily between meals as needed. PRN)   aspirin 81 MG chewable tablet Chew by mouth daily.   cetirizine (ZYRTEC) 10 MG tablet Take 10 mg by mouth as needed.    Cholecalciferol (D3-1000) 25 MCG (1000 UT) capsule    Cyanocobalamin  (B-12) 1000 MCG CAPS    glucose blood (ACCU-CHEK GUIDE TEST) test strip by In Vitro route. Use to test blood sugar once daily.   lisinopril -hydrochlorothiazide  (ZESTORETIC ) 20-12.5 MG tablet Take 0.5 tablets by mouth daily. 1/2 tablet (Patient taking differently: Take 1 tablet by mouth daily.)   metFORMIN  (GLUCOPHAGE ) 1000 MG tablet Take 1 tablet (1,000 mg total) by mouth daily for 1 dose. 1 tablet at night   naproxen sodium (ANAPROX) 220 MG tablet Take 220 mg by mouth 2 (two) times daily with a meal. PRN   Pyridoxine HCl (VITAMIN B6) 200 MG TABS Take 100 mg by mouth 2 (two) times daily.   rOPINIRole  (REQUIP ) 0.25 MG tablet TAKE 1 TABLET BY MOUTH AT BEDTIME AS NEEDED FOR  RESTLESS  LEG  SYMPTOMS   simvastatin  (ZOCOR ) 20 MG tablet Take 1 tablet (20 mg total) by mouth daily.   temazepam  (RESTORIL ) 15 MG capsule Take 1 capsule (15 mg total) by mouth at bedtime as needed for sleep.   tirzepatide   (MOUNJARO ) 10 MG/0.5ML Pen Inject 10 mg into the skin once a week.       12/10/2023    3:39 PM 10/01/2023   10:40 AM 07/27/2023   10:04 AM 05/29/2023   11:46 AM  GAD 7 : Generalized Anxiety Score  Nervous, Anxious, on Edge 0 0 0 0  Control/stop worrying 0 0 0 0  Worry too much - different things 0 0 0 0  Trouble relaxing 0 0 0 0  Restless 0 0 0 0  Easily annoyed or irritable 0 0 0 0  Afraid - awful might happen 0 0 0 0  Total GAD 7 Score 0 0 0  0  Anxiety Difficulty Not difficult at all Not difficult at all Not difficult at all Not difficult at all       12/10/2023    3:39 PM 10/01/2023   10:40 AM 08/01/2023   11:39 AM  Depression screen PHQ 2/9  Decreased Interest 0 0 0  Down, Depressed, Hopeless 0 0 0  PHQ - 2 Score 0 0 0  Altered sleeping 0 0 0  Tired, decreased energy 0 0 0  Change in appetite 0 0 0  Feeling bad or failure about yourself  0 0 0  Trouble concentrating 0 0 0  Moving slowly or fidgety/restless 0 0 0  Suicidal thoughts 0 0 0  PHQ-9 Score 0 0  0   Difficult doing work/chores Not difficult at all Not difficult at all Not difficult at all     Data saved with a previous flowsheet row definition    BP Readings from Last 3 Encounters:  12/10/23 122/74  10/29/23 110/60  10/01/23 118/70    Physical Exam Vitals and nursing note reviewed.  Constitutional:      General: She is not in acute distress.    Appearance: Normal appearance. She is well-developed.  HENT:     Head: Normocephalic and atraumatic.  Cardiovascular:     Rate and Rhythm: Normal rate and regular rhythm.  Pulmonary:     Effort: Pulmonary effort is normal. No respiratory distress.     Breath sounds: No wheezing or rhonchi.  Abdominal:     Palpations: Abdomen is soft.     Tenderness: There is abdominal tenderness (mild diffuse tenderness). There is no guarding or rebound.  Skin:    General: Skin is warm and dry.     Findings: No rash.  Neurological:     Mental Status: She is alert and  oriented to person, place, and time.  Psychiatric:        Mood and Affect: Mood normal.        Behavior: Behavior normal.     Wt Readings from Last 3 Encounters:  12/10/23 189 lb (85.7 kg)  10/29/23 189 lb (85.7 kg)  10/01/23 188 lb (85.3 kg)    BP 122/74   Pulse 96   Ht 5' 2 (1.575 m)   Wt 189 lb (85.7 kg)   SpO2 97%   BMI 34.57 kg/m   Assessment and Plan:  Problem List Items Addressed This Visit       Unprioritized   Type II diabetes mellitus with complication (HCC) (Chronic)   Having some constipation - possibly due to Mounjaro  Consider reducing the dose to 7.5 mg with next fill if unable to get it under control      Other Visit Diagnoses       Drug-induced constipation    -  Primary   begin Senakot-S gummies daily - adjust to 1-2 to effect can also continue prune juice and/or Miralax powder consider reducing the dose of Mounjaro      Long-term current use of injectable noninsulin antidiabetic medication           No follow-ups on file.    Leita HILARIO Adie, MD [facility] [place]

## 2023-12-10 NOTE — Assessment & Plan Note (Signed)
 Having some constipation - possibly due to Mounjaro  Consider reducing the dose to 7.5 mg with next fill if unable to get it under control

## 2023-12-10 NOTE — Patient Instructions (Addendum)
 Senkot-S  one or two per day with 6 oz liquid. OR Miralax powder daily in 6 oz water or other liquid.  Start with 1/2 capful and adjust dose upward every 5-7 days until you have one soft bowel movement per day.  If you need more that one capful, separate the doses into 2 per day.

## 2023-12-23 ENCOUNTER — Other Ambulatory Visit: Payer: Self-pay | Admitting: Internal Medicine

## 2023-12-23 DIAGNOSIS — E7800 Pure hypercholesterolemia, unspecified: Secondary | ICD-10-CM

## 2023-12-26 ENCOUNTER — Telehealth: Payer: Self-pay | Admitting: Internal Medicine

## 2023-12-26 ENCOUNTER — Other Ambulatory Visit: Payer: Self-pay | Admitting: Internal Medicine

## 2023-12-26 DIAGNOSIS — E118 Type 2 diabetes mellitus with unspecified complications: Secondary | ICD-10-CM

## 2023-12-26 MED ORDER — GLUCOSE BLOOD VI STRP: ORAL_STRIP | 12 refills | Status: AC

## 2023-12-26 MED ORDER — ACCU-CHEK SOFTCLIX LANCETS MISC: 1 refills | Status: AC

## 2023-12-26 MED ORDER — TIRZEPATIDE 12.5 MG/0.5ML ~~LOC~~ SOAJ: 12.5000 mg | SUBCUTANEOUS | 1 refills | Status: DC

## 2023-12-26 NOTE — Telephone Encounter (Signed)
 Please review and send RX if appropriate and I can call her and let her know.  JM

## 2023-12-26 NOTE — Telephone Encounter (Signed)
 Copied from CRM #[id number]. Topic: Clinical - Medication Question >> Dec 26, 2023 12:18 PM Santiya F wrote: Reason for CRM: Patient is calling in requesting a new prescription for Accuchek lancets [facility]. Patient is requesting a 3 month supply.

## 2023-12-26 NOTE — Telephone Encounter (Signed)
 Please send new RX. Thank you.  JM

## 2023-12-26 NOTE — Progress Notes (Unsigned)
 Date:  12/26/2023   Name:  Nancy Chen   DOB:  08/31/1945   MRN:  [id number]   Chief Complaint: No chief complaint on file.  HPI  Review of Systems   Lab Results  Component Value Date   NA 140 07/27/2023   K 4.1 07/27/2023   CO2 23 07/27/2023   GLUCOSE 87 07/27/2023   BUN 24 07/27/2023   CREATININE 0.88 07/27/2023   CALCIUM 10.2 07/27/2023   EGFR 68 07/27/2023   GFRNONAA 80 11/06/2019   Lab Results  Component Value Date   CHOL 131 07/27/2023   HDL 43 07/27/2023   LDLCALC 59 07/27/2023   TRIG 175 (H) 07/27/2023   CHOLHDL 3.0 07/27/2023   Lab Results  Component Value Date   TSH 1.000 07/27/2023   Lab Results  Component Value Date   HGBA1C 5.1 10/01/2023   Lab Results  Component Value Date   WBC 4.9 07/27/2023   HGB 14.0 07/27/2023   HCT 42.9 07/27/2023   MCV 89 07/27/2023   PLT 228 07/27/2023   Lab Results  Component Value Date   ALT 21 07/27/2023   AST 22 07/27/2023   ALKPHOS 47 07/27/2023   BILITOT 0.6 07/27/2023   Lab Results  Component Value Date   VD25OH 35.5 07/27/2023     Patient Active Problem List   Diagnosis Date Noted   Restless leg syndrome 10/24/2022   Primary insomnia 10/24/2022   Vitamin D  deficiency 07/25/2022   Heart palpitations 02/15/2022   Aortic atherosclerosis 07/15/2019   Facial basal cell cancer 10/31/2016   Migraine without aura and without status migrainosus, not intractable 10/31/2016   Lipoma of left upper extremity 10/11/2015   Type II diabetes mellitus with complication (HCC) 02/11/2015   Ovarian failure 11/10/2014   Essential (primary) hypertension 07/15/2014   Hyperlipidemia associated with type 2 diabetes mellitus (HCC) 07/15/2014   Tricompartment osteoarthritis of knee 07/15/2014    Allergies[1]  Past Surgical History:  Procedure Laterality Date   ABDOMINAL HYSTERECTOMY     BASAL CELL CARCINOMA EXCISION     CARPAL TUNNEL RELEASE Bilateral    CARPAL TUNNEL RELEASE     CATARACT EXTRACTION W/PHACO  Left 09/07/2016   Procedure: CATARACT EXTRACTION PHACO AND INTRAOCULAR LENS PLACEMENT (IOC);  Surgeon: Myrna Adine Anes, MD;  Location: [facility] ORS;  Service: Ophthalmology;  Laterality: Left;  US  00:41.9 AP% 14.9 CDE 6.23 Fluid pack lot # [id number]   CATARACT EXTRACTION W/PHACO Right 10/19/2016   Procedure: CATARACT EXTRACTION PHACO AND INTRAOCULAR LENS PLACEMENT (IOC);  Surgeon: Myrna Adine Anes, MD;  Location: [facility] OR[facility];  Service: Ophthalmology;  Laterality: Right;  casette lot # [id number] H US   00:59.5 AP%   16.5 CDE   9.79    COLONOSCOPY  06/2012   normal (in [place])   EYE SURGERY     TONSILLECTOMY     TUBAL LIGATION      Social History[2]   Medication list has been reviewed and updated.  Active Medications[3]     12/10/2023    3:39 PM 10/01/2023   10:40 AM 07/27/2023   10:04 AM 05/29/2023   11:46 AM  GAD 7 : Generalized Anxiety Score  Nervous, Anxious, on Edge 0 0 0 0  Control/stop worrying 0 0 0 0  Worry too much - different things 0 0 0 0  Trouble relaxing 0 0 0 0  Restless 0 0 0 0  Easily annoyed or irritable 0 0 0 0  Afraid - awful might happen 0  0 0 0  Total GAD 7 Score 0 0 0 0  Anxiety Difficulty Not difficult at all Not difficult at all Not difficult at all Not difficult at all       12/10/2023    3:39 PM 10/01/2023   10:40 AM 08/01/2023   11:39 AM  Depression screen PHQ 2/9  Decreased Interest 0 0 0  Down, Depressed, Hopeless 0 0 0  PHQ - 2 Score 0 0 0  Altered sleeping 0 0 0  Tired, decreased energy 0 0 0  Change in appetite 0 0 0  Feeling bad or failure about yourself  0 0 0  Trouble concentrating 0 0 0  Moving slowly or fidgety/restless 0 0 0  Suicidal thoughts 0 0 0  PHQ-9 Score 0 0  0   Difficult doing work/chores Not difficult at all Not difficult at all Not difficult at all     Data saved with a previous flowsheet row definition    BP Readings from Last 3 Encounters:  12/10/23 122/74  10/29/23 110/60  10/01/23 118/70    Physical Exam  Wt  Readings from Last 3 Encounters:  12/10/23 189 lb (85.7 kg)  10/29/23 189 lb (85.7 kg)  10/01/23 188 lb (85.3 kg)    There were no vitals taken for this visit.  Assessment and Plan:  Problem List Items Addressed This Visit   None   No follow-ups on file.    Leita HILARIO Adie, MD [place] [facility] [place]           [1]  Allergies Allergen Reactions   Glipizide Swelling    hives   Latex    Codeine Hives  [2]  Social History Tobacco Use   Smoking status: Never    Passive exposure: Past   Smokeless tobacco: Never   Tobacco comments:    smoking cessation materials not required  Vaping Use   Vaping status: Never Used  Substance Use Topics   Alcohol use: Not Currently    Alcohol/week: 1.0 standard drink of alcohol    Types: 1 Glasses of wine per week    Comment: once monthly maybe, last use 2 years ago   Drug use: Never  [3]  No outpatient medications have been marked as taking for the 12/26/23 encounter (Orders Only) with Adie Leita DEL, MD.

## 2023-12-26 NOTE — Telephone Encounter (Signed)
 Copied from CRM [id number]. Topic: Clinical - Medication Question >> Dec 26, 2023 12:14 PM Santiya F wrote: Reason for CRM: Patient is calling in because [facility] is trying to refill her Mounjaro  prescription and the pharmacy said it got denied. Patient says it's because Dr. Berglund was supposed to change the dosage but she wasn't sure if the dosage was supposed to go up or down. Patient is requesting this be resolved and someone reach out to her once it has been.

## 2023-12-26 NOTE — Telephone Encounter (Signed)
 Requested medication (s) are due for refill today: yes  Requested medication (s) are on the active medication list: yes  Last refill:  10/29/23  Future visit scheduled: yes  Notes to clinic:  Unable to refill per protocol, last refill by another provider.      Requested Prescriptions  Pending Prescriptions Disp Refills   lisinopril -hydrochlorothiazide  (ZESTORETIC ) 20-12.5 MG tablet [Pharmacy Med Name: Lisinopril -hydroCHLOROthiazide  20-12.5 MG Oral Tablet] 90 tablet 0    Sig: Take 1 tablet by mouth once daily     Cardiovascular:  ACEI + Diuretic Combos Passed - 12/26/2023  9:54 AM      Passed - Na in normal range and within 180 days    Sodium  Date Value Ref Range Status  07/27/2023 140 134 - 144 mmol/L Final         Passed - K in normal range and within 180 days    Potassium  Date Value Ref Range Status  07/27/2023 4.1 3.5 - 5.2 mmol/L Final         Passed - Cr in normal range and within 180 days    Creatinine, Ser  Date Value Ref Range Status  07/27/2023 0.88 0.57 - 1.00 mg/dL Final   Creatinine, Urine  Date Value Ref Range Status  01/18/2022 239  Final         Passed - eGFR is 30 or above and within 180 days    GFR calc Af Amer  Date Value Ref Range Status  11/06/2019 92 >59 mL/min/1.73 Final    Comment:    **In accordance with recommendations from the NKF-ASN Task force,**   [facility] is in the process of updating its eGFR calculation to the   2021 CKD-EPI creatinine equation that estimates kidney function   without a race variable.    GFR calc non Af Amer  Date Value Ref Range Status  11/06/2019 80 >59 mL/min/1.73 Final   eGFR  Date Value Ref Range Status  07/27/2023 68 >59 mL/min/1.73 Final         Passed - Patient is not pregnant      Passed - Last BP in normal range    BP Readings from Last 1 Encounters:  12/10/23 122/74         Passed - Valid encounter within last 6 months    Recent Outpatient Visits           2 weeks ago Drug-induced  constipation   [place] [facility] at [facility], Leita DEL, MD   2 months ago Type II diabetes mellitus with complication [facility])   [place] [facility] at [facility], Leita DEL, MD   5 months ago Essential (primary) hypertension   [place] [facility] at [facility], Leita DEL, MD   7 months ago Essential (primary) hypertension   [place] [facility] at [facility], Leita DEL, MD   8 months ago Type II diabetes mellitus with complication [facility])   [place] [facility] at [facility], Leita DEL, MD

## 2023-12-26 NOTE — Telephone Encounter (Signed)
 Spoke with patient and she states that her constipation has improved some. She is taking stool softeners every other day to help. She is not taking a fiber supplement. Advised her to add in fiber supplement and this may help her not need stool softeners so often. Explained the Mounjaro  dosing as advised by Dr. Justus. Patient states she has been on the 10mg  for 3 months and would like to increase her dose. Again advised her to start fiber as soon as possible in case the increased dose makes her constipation worse. Also advised her to contact office if she notices significant increase in constipation with higher dose, and that we may need to reduce dose at that time. She voiced understanding to all.   Ok to send in higher dose per Dr. Justus.

## 2023-12-26 NOTE — Telephone Encounter (Signed)
 Rx sent.

## 2023-12-27 ENCOUNTER — Other Ambulatory Visit: Payer: Self-pay | Admitting: Internal Medicine

## 2023-12-27 DIAGNOSIS — E118 Type 2 diabetes mellitus with unspecified complications: Secondary | ICD-10-CM

## 2023-12-28 ENCOUNTER — Other Ambulatory Visit: Payer: Self-pay

## 2023-12-28 ENCOUNTER — Ambulatory Visit: Admitting: Internal Medicine

## 2023-12-28 ENCOUNTER — Telehealth: Payer: Self-pay | Admitting: Internal Medicine

## 2023-12-28 DIAGNOSIS — E118 Type 2 diabetes mellitus with unspecified complications: Secondary | ICD-10-CM

## 2023-12-28 MED ORDER — ACCU-CHEK SOFTCLIX LANCETS MISC: 1.0000 | Freq: Three times a day (TID) | 1 refills | Status: AC

## 2023-12-28 NOTE — Telephone Encounter (Signed)
 The correct refill has been sent in.  KP

## 2023-12-28 NOTE — Telephone Encounter (Signed)
 Copied from CRM [id number]. Topic: Clinical - Medication Question >> Dec 28, 2023 12:07 PM Antwanette L wrote: Reason for CRM: Suzen from [facility] called requesting clarification regarding the patient's Accu-Chek Softclix Lancets. The patient is not currently at the pharmacy. Please follow up with Suzen to provide clarification. Suzen can be reached at [phone]

## 2024-01-31 ENCOUNTER — Telehealth: Payer: Self-pay

## 2024-01-31 ENCOUNTER — Encounter: Admitting: Family Medicine

## 2024-01-31 ENCOUNTER — Ambulatory Visit: Admitting: Student

## 2024-01-31 ENCOUNTER — Encounter: Payer: Self-pay | Admitting: Student

## 2024-01-31 VITALS — BP 138/74 | HR 89 | Temp 97.7°F | Ht 62.0 in | Wt 188.0 lb

## 2024-01-31 DIAGNOSIS — I10 Essential (primary) hypertension: Secondary | ICD-10-CM | POA: Diagnosis not present

## 2024-01-31 DIAGNOSIS — Z7984 Long term (current) use of oral hypoglycemic drugs: Secondary | ICD-10-CM | POA: Diagnosis not present

## 2024-01-31 DIAGNOSIS — E7800 Pure hypercholesterolemia, unspecified: Secondary | ICD-10-CM

## 2024-01-31 DIAGNOSIS — E785 Hyperlipidemia, unspecified: Secondary | ICD-10-CM | POA: Diagnosis not present

## 2024-01-31 DIAGNOSIS — E118 Type 2 diabetes mellitus with unspecified complications: Secondary | ICD-10-CM

## 2024-01-31 DIAGNOSIS — F5101 Primary insomnia: Secondary | ICD-10-CM

## 2024-01-31 DIAGNOSIS — E1169 Type 2 diabetes mellitus with other specified complication: Secondary | ICD-10-CM

## 2024-01-31 DIAGNOSIS — I700 Atherosclerosis of aorta: Secondary | ICD-10-CM | POA: Diagnosis not present

## 2024-01-31 DIAGNOSIS — G2581 Restless legs syndrome: Secondary | ICD-10-CM | POA: Diagnosis not present

## 2024-01-31 DIAGNOSIS — Z7985 Long-term (current) use of injectable non-insulin antidiabetic drugs: Secondary | ICD-10-CM

## 2024-01-31 LAB — POCT GLYCOSYLATED HEMOGLOBIN (HGB A1C): Hemoglobin A1C: 5.7 % — AB (ref 4.0–5.6)

## 2024-01-31 MED ORDER — LISINOPRIL-HYDROCHLOROTHIAZIDE 20-12.5 MG PO TABS: 1.0000 | ORAL_TABLET | Freq: Every day | ORAL | 1 refills | Status: AC

## 2024-01-31 MED ORDER — ROPINIROLE HCL 0.25 MG PO TABS: 0.2500 mg | ORAL_TABLET | Freq: Every day | ORAL | 1 refills | Status: AC

## 2024-01-31 MED ORDER — TIRZEPATIDE 7.5 MG/0.5ML ~~LOC~~ SOAJ: 7.5000 mg | SUBCUTANEOUS | 1 refills | Status: AC

## 2024-01-31 NOTE — Assessment & Plan Note (Addendum)
 Rarely takes temazepam  as needed, does make her groggy. Using about once every 2 months. Continue current medication.

## 2024-01-31 NOTE — Telephone Encounter (Signed)
 Nancy Chen

## 2024-01-31 NOTE — Assessment & Plan Note (Signed)
 Doing well on ropinirole  0.25 mg daily, refilled today.

## 2024-01-31 NOTE — Assessment & Plan Note (Addendum)
 Simvastatin  20 mg daily. LDL well controlled at 59. The 10-year ASCVD risk score (Arnett DK, et al., 2019) is: 43.7%. Elevated risk. Monitor lipid panel, could consider increasing statin intensity if LDL>55. SABRA

## 2024-01-31 NOTE — Assessment & Plan Note (Addendum)
 BP is 138/74 today. No longer having lightheadedness. Currently medications is lisinopril -hydrochlorothiazide  20-12.5 mg daily. Was taking 1/2 tablet when pressures are low Hx of hypotension only after standing for long periods of time for 1-2 hours and eating and drinking less due to shopping. Has not checked home pressures lately.  Increase back to lisinopril -hydrochlorothiazide  20-2.5 mg daily

## 2024-01-31 NOTE — Telephone Encounter (Unsigned)
 Copied from CRM #[id number]. Topic: Clinical - Prescription Issue >> Jan 31, 2024  1:54 PM Donna BRAVO wrote: Reason for CRM: Patient is at [facility], the pharmacy is stating they need a prior auth for tirzepatide  (MOUNJARO ) 7.5 MG/0.5ML Pen   Patient would like a reply through [employer].

## 2024-01-31 NOTE — Assessment & Plan Note (Addendum)
 Currently on metformin  1000 mg daily  constipation on mounjaro  12.5 mg weekly. Reports CBG 90-100 fasting. A1c 5.7% today well controlled. Decrease mounjaro  to 7.5 mg weekly to help with symptoms.

## 2024-02-01 ENCOUNTER — Telehealth: Payer: Self-pay | Admitting: Pharmacy Technician

## 2024-02-01 ENCOUNTER — Other Ambulatory Visit (HOSPITAL_COMMUNITY): Payer: Self-pay

## 2024-02-01 NOTE — Telephone Encounter (Signed)
 Pharmacy Patient Advocate Encounter  Received notification from [facility] MEDICARE that Prior Authorization for Mounjaro  7.5MG /0.5ML auto-injectors has been APPROVED from 02/01/24 to 01/08/2098. Ran test claim, Copay is $607.05 for 3 months. This test claim was processed through [facility]- copay amounts may vary at other pharmacies due to pharmacy/plan contracts, or as the patient moves through the different stages of their insurance plan.   PA #/Case ID/Reference #: [id number]  Patient may be eligible for a Medicare prescription Payment plan. The patient will need to reach out to their insurance company to enrol in the payment plan to spread out their payments throughout the year, If available. This test claim was processed through [facility]- copay amounts may vary at other pharmacies due to pharmacy/plan contracts, or as the patient moves through the different stages of their insurance plan.

## 2024-02-01 NOTE — Telephone Encounter (Signed)
 Pharmacy Patient Advocate Encounter   Received notification from Pt Calls Messages that prior authorization for Mounjaro  7.5MG /0.5ML auto-injectors is required/requested.   Insurance verification completed.   The patient is insured through [facility] MEDICARE.   Per test claim: PA required; PA started via CoverMyMeds. KEY [id number] . Waiting for clinical questions to populate.

## 2024-02-01 NOTE — Telephone Encounter (Signed)
 PA request has been Submitted. New Encounter has been or will be created for follow up. For additional info see Pharmacy Prior Auth telephone encounter from 02/01/24.

## 2024-02-06 ENCOUNTER — Other Ambulatory Visit (HOSPITAL_COMMUNITY): Payer: Self-pay

## 2024-02-11 NOTE — Assessment & Plan Note (Signed)
 Medical management with statin and ASA.

## 2024-05-20 ENCOUNTER — Ambulatory Visit: Admitting: Student

## 2024-08-06 ENCOUNTER — Ambulatory Visit
